# Patient Record
Sex: Male | Born: 1955 | Race: Black or African American | Hispanic: No | Marital: Single | Smoking: Former smoker
Health system: Southern US, Community
[De-identification: ages and names within clinical notes are randomized; demographics above are authoritative.]

## PROBLEM LIST (undated history)

## (undated) ENCOUNTER — Ambulatory Visit: Admission: EM | Payer: Medicaid Other | Source: Home / Self Care

## (undated) DIAGNOSIS — I10 Essential (primary) hypertension: Secondary | ICD-10-CM

## (undated) DIAGNOSIS — M1990 Unspecified osteoarthritis, unspecified site: Secondary | ICD-10-CM

## (undated) DIAGNOSIS — E785 Hyperlipidemia, unspecified: Secondary | ICD-10-CM

## (undated) DIAGNOSIS — I219 Acute myocardial infarction, unspecified: Secondary | ICD-10-CM

## (undated) DIAGNOSIS — I639 Cerebral infarction, unspecified: Secondary | ICD-10-CM

## (undated) DIAGNOSIS — N200 Calculus of kidney: Secondary | ICD-10-CM

## (undated) HISTORY — DX: Calculus of kidney: N20.0

## (undated) HISTORY — DX: Unspecified osteoarthritis, unspecified site: M19.90

## (undated) HISTORY — PX: CHOLECYSTECTOMY: SHX55

---

## 2002-04-29 ENCOUNTER — Emergency Department (HOSPITAL_COMMUNITY): Admission: EM | Admit: 2002-04-29 | Discharge: 2002-04-29 | Payer: Self-pay | Admitting: Emergency Medicine

## 2002-04-29 ENCOUNTER — Encounter: Payer: Self-pay | Admitting: Emergency Medicine

## 2003-03-24 ENCOUNTER — Emergency Department (HOSPITAL_COMMUNITY): Admission: EM | Admit: 2003-03-24 | Discharge: 2003-03-24 | Payer: Self-pay | Admitting: Emergency Medicine

## 2003-04-28 HISTORY — PX: KIDNEY STONE SURGERY: SHX686

## 2004-09-15 ENCOUNTER — Emergency Department (HOSPITAL_COMMUNITY): Admission: EM | Admit: 2004-09-15 | Discharge: 2004-09-16 | Payer: Self-pay | Admitting: *Deleted

## 2005-11-27 ENCOUNTER — Emergency Department (HOSPITAL_COMMUNITY): Admission: EM | Admit: 2005-11-27 | Discharge: 2005-11-27 | Payer: Self-pay | Admitting: Emergency Medicine

## 2007-06-18 ENCOUNTER — Emergency Department (HOSPITAL_COMMUNITY): Admission: EM | Admit: 2007-06-18 | Discharge: 2007-06-18 | Payer: Self-pay | Admitting: Emergency Medicine

## 2007-09-16 ENCOUNTER — Emergency Department (HOSPITAL_COMMUNITY): Admission: EM | Admit: 2007-09-16 | Discharge: 2007-09-16 | Payer: Self-pay | Admitting: Emergency Medicine

## 2007-10-12 ENCOUNTER — Emergency Department (HOSPITAL_COMMUNITY): Admission: EM | Admit: 2007-10-12 | Discharge: 2007-10-12 | Payer: Self-pay | Admitting: Emergency Medicine

## 2007-10-13 ENCOUNTER — Emergency Department (HOSPITAL_COMMUNITY): Admission: EM | Admit: 2007-10-13 | Discharge: 2007-10-13 | Payer: Self-pay | Admitting: Family Medicine

## 2007-10-13 ENCOUNTER — Ambulatory Visit (HOSPITAL_COMMUNITY): Admission: RE | Admit: 2007-10-13 | Discharge: 2007-10-13 | Payer: Self-pay | Admitting: Urology

## 2010-02-20 ENCOUNTER — Emergency Department (HOSPITAL_COMMUNITY): Admission: EM | Admit: 2010-02-20 | Discharge: 2010-02-20 | Payer: Self-pay | Admitting: Emergency Medicine

## 2010-09-09 NOTE — Op Note (Signed)
NAMEZIAN, DELAIR                  ACCOUNT NO.:  [id number]   MEDICAL RECORD NO.:  [id number]          PATIENT TYPE:  AMB   LOCATION:  DAY                          FACILITY:  [facility]   PHYSICIAN:  Ronald L. Earlene Plater, M.D.  DATE OF BIRTH:  16-Feb-1956   DATE OF PROCEDURE:  10/13/2007  DATE OF DISCHARGE:                               OPERATIVE REPORT   DIAGNOSIS:  Urethral stone.   PROCEDURE:  1. Cystourethroscopy.  2. Displacement of urethral stone into bladder.  3. Electrohydraulic lithotripsy and cystolithalopaxy.   SURGEON:  Lucrezia Starch. Earlene Plater, M.D.   ASSISTANT:  Alessandra Bevels. Chase Picket, FNP-C   ANESTHESIA:  LMA.   ESTIMATED BLOOD LOSS:  Negligible.   TUBES:  None.   COMPLICATIONS:  None.   INDICATIONS FOR PROCEDURE:  Mr. Gatliff is a 55 year old black male who  presented with severe pain, urgency, frequency and hematuria.  He was  found on CT scan to have a bladder stone and multiple small renal  calculi, which were peripheral and nonobstructive.  At office  cystourethroscopy, he was found to have a large bladder stone which was  impacted in the membranous urethra, and after understanding the risks,  benefits and alternatives he elected to proceed with the above  procedure.   PROCEDURE IN DETAIL:  The patient was placed in supine position.  After  proper LMA anesthesia, was placed in the dorsal lithotomy position and  prepped and draped with Betadine in a sterile fashion.  Cystourethroscopy was performed with a 22.5 [place] Olympus panendoscope.  The stone was visualized and was manually pushed into the bladder.  Of  interest, the right ureteral orifice appeared to be edematous and  swollen and I suspect it was a passed right ureteral calculus.  The left  ureteral orifice is in normal location and efflux of clear urine was  noted from it.  Utilizing the 9 mm EHL probe, the stone was fragmented  in multiple fragments, which were then irrigated, and the last stone was  grasped with a  nitinol basket to this difficulty, and removed.  Reinspection revealed no significant  trilobar hypertrophy.  The bladder was smoothed-wall and there were no  perforations and no other fragments remained.  The bladder was drained.  The panendoscope was removed.  The specimen was given to the patient and  then submitted for stone analysis, and the patient was taken to the  recovery room stable.      Ronald L. Earlene Plater, M.D.  Electronically Signed     RLD/MEDQ  D:  10/13/2007  T:  10/13/2007  Job:  [id number]

## 2011-01-21 LAB — URINALYSIS, ROUTINE W REFLEX MICROSCOPIC
Glucose, UA: NEGATIVE
Ketones, ur: 15 — AB
Leukocytes, UA: NEGATIVE
Nitrite: POSITIVE — AB
Protein, ur: 100 — AB
Specific Gravity, Urine: >1.03 — ABNORMAL HIGH
Urobilinogen, UA: 1
pH: 6.5

## 2011-01-21 LAB — CBC
HCT: 47.6
Hemoglobin: 16.7
RDW: 14.1

## 2011-01-21 LAB — URINE MICROSCOPIC-ADD ON

## 2011-01-21 LAB — BASIC METABOLIC PANEL
CO2: 26
GFR calc non Af Amer: 55 — ABNORMAL LOW
Glucose, Bld: 112 — ABNORMAL HIGH
Potassium: 4.2
Sodium: 136

## 2011-01-21 LAB — DIFFERENTIAL
Basophils Absolute: 0
Eosinophils Relative: 2
Lymphocytes Relative: 31
Monocytes Absolute: 0.8

## 2011-01-22 LAB — URINALYSIS, ROUTINE W REFLEX MICROSCOPIC
Bilirubin Urine: NEGATIVE
Glucose, UA: NEGATIVE
Ketones, ur: NEGATIVE
Nitrite: NEGATIVE
pH: 7

## 2011-01-22 LAB — HEMOGLOBIN AND HEMATOCRIT, BLOOD: HCT: 51.7

## 2012-01-11 ENCOUNTER — Emergency Department (HOSPITAL_COMMUNITY)
Admission: EM | Admit: 2012-01-11 | Discharge: 2012-01-11 | Disposition: A | Discharge status: Home / Self Care | Payer: Self-pay | Attending: Emergency Medicine | Admitting: Emergency Medicine

## 2012-01-11 ENCOUNTER — Encounter (HOSPITAL_COMMUNITY): Payer: Self-pay | Admitting: *Deleted

## 2012-01-11 DIAGNOSIS — L738 Other specified follicular disorders: Secondary | ICD-10-CM | POA: Insufficient documentation

## 2012-01-11 DIAGNOSIS — Z87891 Personal history of nicotine dependence: Secondary | ICD-10-CM | POA: Insufficient documentation

## 2012-01-11 MED ORDER — CEPHALEXIN 500 MG PO CAPS: 500.0000 mg | ORAL_CAPSULE | Freq: Four times a day (QID) | ORAL | Status: DC

## 2012-01-11 MED ORDER — CEPHALEXIN 500 MG PO CAPS
500.0000 mg | ORAL_CAPSULE | Freq: Once | ORAL | Status: AC
  Administered 2012-01-11: 500 mg via ORAL
  Filled 2012-01-11: qty 1

## 2012-01-11 NOTE — ED Notes (Signed)
Rash to face for 2 days, used someones razor.

## 2012-01-11 NOTE — ED Provider Notes (Signed)
History     CSN: [id number]  Arrival date & time 01/11/12  1634   First MD Initiated Contact with Patient 01/11/12 1741      Chief Complaint  Patient presents with  . Rash    (Consider location/radiation/quality/duration/timing/severity/associated sxs/prior treatment) HPI Comments: Pt used a friend's razor to shave 2-3 days ago and has since broken out in reddish scabbed lesions all over his chin.  Patient is a 56 y.o. male presenting with rash. The history is provided by the patient. No language interpreter was used.  Rash  This is a new problem. The current episode started 2 days ago. The problem has not changed since onset.There has been no fever. Affected Location: chin. Pertinent negatives include no itching. He has tried nothing for the symptoms.    History reviewed. No pertinent past medical history.  Past Surgical History  Procedure Date  . Cholecystectomy   . Kidney stone surgery     History reviewed. No pertinent family history.  History  Substance Use Topics  . Smoking status: Former Games developer  . Smokeless tobacco: Not on file  . Alcohol Use: Yes      Review of Systems  Constitutional: Negative for fever and chills.  Skin: Positive for rash and wound. Negative for itching.  All other systems reviewed and are negative.    Allergies  Penicillins  Home Medications   Current Outpatient Rx  Name Route Sig Dispense Refill  . CEPHALEXIN 500 MG PO CAPS Oral Take 1 capsule (500 mg total) by mouth 4 (four) times daily. 28 capsule 0    BP 120/73  Pulse 98  Temp 98.1 F (36.7 C) (Oral)  Resp 20  Ht 6\' 2"  (1.88 m)  Wt 175 lb (79.379 kg)  BMI 22.47 kg/m2  SpO2 98%  Physical Exam  Nursing note and vitals reviewed. Constitutional: He is oriented to person, place, and time. He appears well-developed and well-nourished.  HENT:  Head: Normocephalic and atraumatic.       10-15 red, inflamed, scabbed lesions scattered over chin.  C/w folliculitis.   Eyes:  EOM are normal.  Neck: Normal range of motion.  Cardiovascular: Normal rate, regular rhythm, normal heart sounds and intact distal pulses.   Pulmonary/Chest: Effort normal and breath sounds normal. No respiratory distress.  Abdominal: Soft. He exhibits no distension. There is no tenderness.  Musculoskeletal: Normal range of motion.  Lymphadenopathy:       Right cervical: No superficial cervical, no deep cervical and no posterior cervical adenopathy present.      Left cervical: No superficial cervical, no deep cervical and no posterior cervical adenopathy present.  Neurological: He is alert and oriented to person, place, and time.  Skin: Skin is warm and dry. Rash noted.  Psychiatric: He has a normal mood and affect. Judgment normal.    ED Course  Procedures (including critical care time)  Labs Reviewed - No data to display No results found.   1. Folliculitis barbae       MDM   rx-keflex 500 mg QID, 28 Warm compresses. Return prn        Evalina Field, [place] 01/11/12 1933

## 2012-01-11 NOTE — ED Provider Notes (Signed)
Medical screening examination/treatment/procedure(s) were performed by non-physician practitioner and as supervising physician I was immediately available for consultation/collaboration  Monicia Tse R. Madisin Hasan, MD 01/11/12 2314 

## 2015-07-15 ENCOUNTER — Emergency Department (INDEPENDENT_AMBULATORY_CARE_PROVIDER_SITE_OTHER): Payer: BLUE CROSS/BLUE SHIELD

## 2015-07-15 ENCOUNTER — Encounter (HOSPITAL_COMMUNITY): Payer: Self-pay | Admitting: Emergency Medicine

## 2015-07-15 ENCOUNTER — Emergency Department (INDEPENDENT_AMBULATORY_CARE_PROVIDER_SITE_OTHER)
Admission: EM | Admit: 2015-07-15 | Discharge: 2015-07-15 | Disposition: A | Discharge status: Home / Self Care | Payer: BLUE CROSS/BLUE SHIELD | Source: Home / Self Care | Attending: Family Medicine | Admitting: Family Medicine

## 2015-07-15 DIAGNOSIS — J111 Influenza due to unidentified influenza virus with other respiratory manifestations: Secondary | ICD-10-CM

## 2015-07-15 MED ORDER — DOXYCYCLINE HYCLATE 100 MG PO CAPS: 100.0000 mg | ORAL_CAPSULE | Freq: Two times a day (BID) | ORAL | Status: DC

## 2015-07-15 NOTE — ED Notes (Signed)
Coughing up brown phlegm, neck and back soreness, denies runny nose.  Reports congestion in the back of throat, frequently having to clear his throat

## 2015-07-15 NOTE — Discharge Instructions (Signed)
Drink plenty of fluids as discussed, use medicine as prescribed, and mucinex or delsym for cough. Return or see your doctor if further problems °

## 2015-07-15 NOTE — ED Provider Notes (Addendum)
CSN: [id number]     Arrival date & time 07/15/15  1652 History   First MD Initiated Contact with Patient 07/15/15 1845     Chief Complaint  Patient presents with  . URI   (Consider location/radiation/quality/duration/timing/severity/associated sxs/prior Treatment) Patient is a 60 y.o. male presenting with URI. The history is provided by the patient.  URI Presenting symptoms: congestion and cough   Presenting symptoms: no fever and no rhinorrhea   Severity:  Moderate Onset quality:  Gradual Duration:  5 days Progression:  Unchanged Chronicity:  New Relieved by:  None tried Worsened by:  Nothing tried Ineffective treatments:  None tried Associated symptoms: no wheezing   Associated symptoms comment:  Smoker    History reviewed. No pertinent past medical history. Past Surgical History  Procedure Laterality Date  . Cholecystectomy    . Kidney stone surgery     No family history on file. Social History  Substance Use Topics  . Smoking status: Former Games developer  . Smokeless tobacco: None  . Alcohol Use: Yes    Review of Systems  Constitutional: Negative.  Negative for fever and chills.  HENT: Positive for congestion and postnasal drip. Negative for rhinorrhea.   Respiratory: Positive for cough. Negative for shortness of breath and wheezing.   Cardiovascular: Negative.   Gastrointestinal: Negative.   Genitourinary: Negative.   All other systems reviewed and are negative.   Allergies  Penicillins  Home Medications   Prior to Admission medications   Medication Sig Start Date End Date Taking? Authorizing Provider  cephALEXin (KEFLEX) 500 MG capsule Take 1 capsule (500 mg total) by mouth 4 (four) times daily. Patient not taking: Reported on 07/15/2015 01/11/12   Worthy Rancher, PA-C  doxycycline (VIBRAMYCIN) 100 MG capsule Take 1 capsule (100 mg total) by mouth 2 (two) times daily. 07/15/15   Linna Hoff, MD   Meds Ordered and Administered this Visit  Medications - No  data to display  BP 160/91 mmHg  Pulse 62  Temp(Src) 98 F (36.7 C) (Oral)  Resp 16  SpO2 100% No data found.   Physical Exam  Constitutional: He is oriented to person, place, and time. He appears well-developed and well-nourished. No distress.  HENT:  Right Ear: External ear normal.  Left Ear: External ear normal.  Mouth/Throat: Oropharynx is clear and moist.  Neck: Normal range of motion. Neck supple.  Cardiovascular: Normal rate, regular rhythm, normal heart sounds and intact distal pulses.   Pulmonary/Chest: Effort normal. He has decreased breath sounds. He has rhonchi.  Lymphadenopathy:    He has no cervical adenopathy.  Neurological: He is alert and oriented to person, place, and time.  Skin: Skin is warm and dry.  Nursing note and vitals reviewed.   ED Course  Procedures (including critical care time)  Labs Review Labs Reviewed - No data to display  Imaging Review Dg Chest 2 View  07/15/2015  CLINICAL DATA:  Neck and back pain radiating to the chest for 1 week. Smoker. EXAM: CHEST  2 VIEW COMPARISON:  02/12/2014 radiographs. FINDINGS: The heart size and mediastinal contours are normal. The lungs are clear. There is no pleural effusion or pneumothorax. No acute osseous findings are identified. Cholecystectomy clips are noted. IMPRESSION: No active cardiopulmonary process. Electronically Signed   By: Carey Bullocks M.D.   On: 07/15/2015 19:21   X-rays reviewed and report per radiologist.   Visual Acuity Review  Right Eye Distance:   Left Eye Distance:   Bilateral Distance:  Right Eye Near:   Left Eye Near:    Bilateral Near:         MDM   1. Bronchitis with influenza    Meds ordered this encounter  Medications  . doxycycline (VIBRAMYCIN) 100 MG capsule    Sig: Take 1 capsule (100 mg total) by mouth 2 (two) times daily.    Dispense:  20 capsule    Refill:  0        Linna HoffJames D Curry Dulski, MD 07/15/15 1934  Linna HoffJames D Auryn Paige, MD 07/15/15 [phone]

## 2015-08-13 ENCOUNTER — Encounter: Payer: Self-pay | Admitting: Internal Medicine

## 2015-09-04 ENCOUNTER — Ambulatory Visit (INDEPENDENT_AMBULATORY_CARE_PROVIDER_SITE_OTHER): Payer: BLUE CROSS/BLUE SHIELD | Admitting: Nurse Practitioner

## 2015-09-04 ENCOUNTER — Other Ambulatory Visit: Payer: Self-pay

## 2015-09-04 ENCOUNTER — Encounter: Payer: Self-pay | Admitting: Nurse Practitioner

## 2015-09-04 VITALS — BP 136/81 | HR 67 | Temp 97.4°F | Ht 74.0 in | Wt 180.8 lb

## 2015-09-04 DIAGNOSIS — R634 Abnormal weight loss: Secondary | ICD-10-CM

## 2015-09-04 DIAGNOSIS — Z1211 Encounter for screening for malignant neoplasm of colon: Secondary | ICD-10-CM | POA: Insufficient documentation

## 2015-09-04 MED ORDER — PEG 3350-KCL-NA BICARB-NACL 420 G PO SOLR: 4000.0000 mL | ORAL | Status: DC

## 2015-09-04 NOTE — Patient Instructions (Signed)
1. We will schedule your procedure for you. 2. Return for follow-up based on postprocedure recommendations. 

## 2015-09-04 NOTE — Progress Notes (Signed)
[facility] Primary Gastroenterologist:  Dr. Jena Gaussourk  Chief Complaint  Patient presents with  . set up TCS possible EGD    HPI:   Terry Mayo is a 60 y.o. male who presents On referral from primary care for fatigue and weight loss. Last saw PCP for 17 2017, notes reviewed. Patient has been seen primary care for the last 2 visits, at his first visit on 06/11/2015 stated "he had not had a physical in a while." His follow-up visit weight loss is continuing. Per referral patient has noted total of 37 pounds weight loss since 03/16/2015. CMP notes creatinine of 1.27. Per PCP past medical history no history of screening colonoscopy for this patient. No other abnormal labs included or discussed. No history of colonoscopy in our system.  Today he states he has not had a colonoscopy before. Has lost about 35 pounds in 2 months. Currently around his recent minimum weight. He states he is very active though. Denies abdominal pain, N/V, hematochezia, melena, fever, chills. Has a bowel movement about every 2-3 days. Has been going daily this week. Occasional straining. Poor appetite. Denies chest pain, dyspnea, dizziness, lightheadedness, syncope, near syncope. Denies any other upper or lower GI symptoms.  No past medical history on file.  Past Surgical History  Procedure Laterality Date  . Cholecystectomy    . Kidney stone surgery      Current Outpatient Prescriptions  Medication Sig Dispense Refill  . cephALEXin (KEFLEX) 500 MG capsule Take 1 capsule (500 mg total) by mouth 4 (four) times daily. (Patient not taking: Reported on 07/15/2015) 28 capsule 0  . doxycycline (VIBRAMYCIN) 100 MG capsule Take 1 capsule (100 mg total) by mouth 2 (two) times daily. (Patient not taking: Reported on 09/04/2015) 20 capsule 0   No current facility-administered medications for this visit.    Allergies as of 09/04/2015 - Review Complete 09/04/2015  Allergen  Reaction Noted  . Penicillins  01/11/2012    No family history on file.  Social History   Social History  . Marital Status: Single    Spouse Name: N/A  . Number of Children: N/A  . Years of Education: N/A   Occupational History  . Not on file.   Social History Main Topics  . Smoking status: Former Games developermoker  . Smokeless tobacco: Not on file  . Alcohol Use: Yes  . Drug Use: No  . Sexual Activity: Not on file   Other Topics Concern  . Not on file   Social History Narrative    Review of Systems: General: Negative for anorexia, fever, chills. ENT: Negative for hoarseness, difficulty swallowing , nasal congestion. CV: Negative for chest pain, angina, palpitations, dyspnea on exertion, peripheral edema.  Respiratory: Negative for dyspnea at rest, dyspnea on exertion, cough, sputum, wheezing.  GI: See history of present illness. MS: Negative for joint pain, low back pain.  Psych: Admits anxiety related to the procedure.  Endo: Negative for unusual weight change.  Heme: Negative for bruising or bleeding. Allergy: Negative for rash or hives.    Physical Exam: BP 136/81 mmHg  Pulse 67  Temp(Src) 97.4 F (36.3 C)  Ht 6\' 2"  (1.88 m)  Wt 180 lb 12.8 oz (82.01 kg)  BMI 23.20 kg/m2 General:   Alert and oriented. Pleasant and cooperative. Well-nourished and well-developed.  Head:  Normocephalic and atraumatic. Eyes:  Without icterus, sclera clear and conjunctiva pink.  Ears:  Normal auditory acuity. Cardiovascular:  S1, S2 present without murmurs appreciated. Extremities without clubbing or edema. Respiratory:  Clear to auscultation bilaterally. No wheezes, rales, or rhonchi. No distress.  Gastrointestinal:  +BS, soft, non-tender and non-distended. No HSM noted. No guarding or rebound. No masses appreciated.  Rectal:  Deferred  Musculoskalatal:  Symmetrical without gross deformities. Neurologic:  Alert and oriented x4;  grossly normal neurologically. Psych:  Alert and  cooperative. Normal mood and affect. Heme/Lymph/Immune: No excessive bruising noted.    09/04/2015 8:48 AM   Disclaimer: This note was dictated with voice recognition software. Similar sounding words can inadvertently be transcribed and may not be corrected upon review.

## 2015-09-04 NOTE — Progress Notes (Signed)
cc'ed to pcp °

## 2015-09-04 NOTE — Assessment & Plan Note (Signed)
Subjective weight loss of approximately 35 pounds in the past couple few months. Otherwise asymptomatic from a GI standpoint. Has never had a colonoscopy before. We will set him up for initial colonoscopy, return for follow-up based on postprocedure recommendations.  Proceed with TCS with propofol/MAC with Dr. Jena Gaussourk in near future: the risks, benefits, and alternatives have been discussed with the patient in detail. The patient states understanding and desires to proceed.  The patient is not on any anticoagulants, anxiolytics, chronic pain medications, antidepressants. He drinks substantial alcohol and smokes marijuana regularly. We will plan for the procedure on propofol/MAC to promote adequate sedation.

## 2015-09-25 NOTE — Patient Instructions (Signed)
Terry Mayo  09/25/2015     @PREFPERIOPPHARMACY @   Your procedure is scheduled on 09/30/2015.  [facility] at 8:00 A.M.  Call this number if you have problems the morning of surgery:  [phone]   Remember:  Do not eat food or drink liquids after midnight.  Take these medicines the morning of surgery with A SIP OF WATER:  none   Do not wear jewelry, make-up or nail polish.  Do not wear lotions, powders, or perfumes.  You may wear deodorant.  Do not shave 48 hours prior to surgery.  Men may shave face and neck.  Do not bring valuables to the hospital.  [facility] not [facility] for any belongings or valuables.  Contacts, dentures or bridgework may not be worn into surgery.  Leave your suitcase in the car.  After surgery it may be brought to your room.  For patients admitted to the hospital, discharge time will be determined by your treatment team.  Patients discharged the day of surgery will not be allowed to drive home.   Name and phone number of your driver:   family Special instructions:  n/a  Please read over the following fact sheets that you were given. Care and Recovery After Surgery    General Anesthesia, Adult General anesthesia is a sleep-like state of non-feeling produced by medicines (anesthetics). General anesthesia prevents you from being alert and feeling pain during a medical procedure. Your caregiver may recommend general anesthesia if your procedure:  Is long.  Is painful or uncomfortable.  Would be frightening to see or hear.  Requires you to be still.  Affects your breathing.  Causes significant blood loss. LET YOUR CAREGIVER KNOW ABOUT:  Allergies to food or medicine.  Medicines taken, including vitamins, herbs, eyedrops, over-the-counter medicines, and creams.  Use of steroids (by mouth or creams).  Previous problems with anesthetics or numbing medicines, including problems experienced by relatives.  History of bleeding  problems or blood clots.  Previous surgeries and types of anesthetics received.  Possibility of pregnancy, if this applies.  Use of cigarettes, alcohol, or illegal drugs.  Any health condition(s), especially diabetes, sleep apnea, and high blood pressure. RISKS AND COMPLICATIONS General anesthesia rarely causes complications. However, if complications do occur, they can be life threatening. Complications include:  A lung infection.  A stroke.  A heart attack.  Waking up during the procedure. When this occurs, the patient may be unable to move and communicate that he or she is awake. The patient may feel severe pain. Older adults and adults with serious medical problems are more likely to have complications than adults who are young and healthy. Some complications can be prevented by answering all of your caregiver's questions thoroughly and by following all pre-procedure instructions. It is important to tell your caregiver if any of the pre-procedure instructions, especially those related to diet, were not followed. Any food or liquid in the stomach can cause problems when you are under general anesthesia. BEFORE THE PROCEDURE  Ask your caregiver if you will have to spend the night at the hospital. If you will not have to spend the night, arrange to have an adult drive you and stay with you for 24 hours.  Follow your caregiver's instructions if you are taking dietary supplements or medicines. Your caregiver may tell you to stop taking them or to reduce your dosage.  Do not smoke for as long as possible before your procedure. If possible, stop smoking  3-6 weeks before the procedure.  Do not take new dietary supplements or medicines within 1 week of your procedure unless your caregiver approves them.  Do not eat within 8 hours of your procedure or as directed by your caregiver. Drink only clear liquids, such as water, black coffee (without milk or cream), and fruit juices (without  pulp).  Do not drink within 3 hours of your procedure or as directed by your caregiver.  You may brush your teeth on the morning of the procedure, but make sure to spit out the toothpaste and water when finished. PROCEDURE  You will receive anesthetics through a mask, through an intravenous (IV) access tube, or through both. A doctor who specializes in anesthesia (anesthesiologist) or a nurse who specializes in anesthesia (nurse anesthetist) or both will stay with you throughout the procedure to make sure you remain unconscious. He or she will also watch your blood pressure, pulse, and oxygen levels to make sure that the anesthetics do not cause any problems. Once you are asleep, a breathing tube or mask may be used to help you breathe. AFTER THE PROCEDURE You will wake up after the procedure is complete. You may be in the room where the procedure was performed or in a recovery area. You may have a sore throat if a breathing tube was used. You may also feel:  Dizzy.  Weak.  Drowsy.  Confused.  Nauseous.  Cold. These are all normal responses and can be expected to last for up to 24 hours after the procedure is complete. A caregiver will tell you when you are ready to go home. This will usually be when you are fully awake and in stable condition.   This information is not intended to replace advice given to you by your health care provider. Make sure you discuss any questions you have with your health care provider.   Document Released: 07/21/2007 Document Revised: 05/04/2014 Document Reviewed: 08/12/2011 Elsevier Interactive Patient Education [employer] Colonoscopy A colonoscopy is an exam to look at the entire large intestine (colon). This exam can help find problems such as tumors, polyps, inflammation, and areas of bleeding. The exam takes about 1 hour.  LET [facility] CARE PROVIDER KNOW ABOUT:   Any allergies you have.  All medicines you are taking, including vitamins,  herbs, eye drops, creams, and over-the-counter medicines.  Previous problems you or members of your family have had with the use of anesthetics.  Any blood disorders you have.  Previous surgeries you have had.  Medical conditions you have. RISKS AND COMPLICATIONS  Generally, this is a safe procedure. However, as with any procedure, complications can occur. Possible complications include:  Bleeding.  Tearing or rupture of the colon wall.  Reaction to medicines given during the exam.  Infection (rare). BEFORE THE PROCEDURE   Ask your health care provider about changing or stopping your regular medicines.  You may be prescribed an oral bowel prep. This involves drinking a large amount of medicated liquid, starting the day before your procedure. The liquid will cause you to have multiple loose stools until your stool is almost clear or light green. This cleans out your colon in preparation for the procedure.  Do not eat or drink anything else once you have started the bowel prep, unless your health care provider tells you it is safe to do so.  Arrange for someone to drive you home after the procedure. PROCEDURE   You will be given medicine to help you  relax (sedative).  You will lie on your side with your knees bent.  A long, flexible tube with a light and camera on the end (colonoscope) will be inserted through the rectum and into the colon. The camera sends video back to a computer screen as it moves through the colon. The colonoscope also releases carbon dioxide gas to inflate the colon. This helps your health care provider see the area better.  During the exam, your health care provider may take a small tissue sample (biopsy) to be examined under a microscope if any abnormalities are found.  The exam is finished when the entire colon has been viewed. AFTER THE PROCEDURE   Do not drive for 24 hours after the exam.  You may have a small amount of blood in your stool.  You  may pass moderate amounts of gas and have mild abdominal cramping or bloating. This is caused by the gas used to inflate your colon during the exam.  Ask when your test results will be ready and how you will get your results. Make sure you get your test results.   This information is not intended to replace advice given to you by your health care provider. Make sure you discuss any questions you have with your health care provider.   Document Released: 04/10/2000 Document Revised: 02/01/2013 Document Reviewed: 12/19/2012 Elsevier Interactive Patient Education [employer]

## 2015-09-26 ENCOUNTER — Other Ambulatory Visit: Payer: Self-pay

## 2015-09-26 ENCOUNTER — Encounter (HOSPITAL_COMMUNITY)
Admission: RE | Admit: 2015-09-26 | Discharge: 2015-09-26 | Disposition: A | Discharge status: Home / Self Care | Payer: BLUE CROSS/BLUE SHIELD | Source: Ambulatory Visit | Attending: Internal Medicine | Admitting: Internal Medicine

## 2015-09-26 ENCOUNTER — Encounter (HOSPITAL_COMMUNITY): Payer: Self-pay

## 2015-09-26 ENCOUNTER — Inpatient Hospital Stay (HOSPITAL_COMMUNITY): Admission: RE | Admit: 2015-09-26 | Payer: BLUE CROSS/BLUE SHIELD | Source: Ambulatory Visit

## 2015-09-26 DIAGNOSIS — Z01812 Encounter for preprocedural laboratory examination: Secondary | ICD-10-CM | POA: Diagnosis not present

## 2015-09-26 DIAGNOSIS — Z6823 Body mass index (BMI) 23.0-23.9, adult: Secondary | ICD-10-CM | POA: Insufficient documentation

## 2015-09-26 DIAGNOSIS — R5383 Other fatigue: Secondary | ICD-10-CM | POA: Insufficient documentation

## 2015-09-26 DIAGNOSIS — R634 Abnormal weight loss: Secondary | ICD-10-CM | POA: Diagnosis not present

## 2015-09-26 DIAGNOSIS — Z01810 Encounter for preprocedural cardiovascular examination: Secondary | ICD-10-CM | POA: Diagnosis not present

## 2015-09-26 LAB — CBC WITH DIFFERENTIAL/PLATELET
Basophils Absolute: 0 10*3/uL (ref 0.0–0.1)
Basophils Relative: 0 %
EOS ABS: 0.4 10*3/uL (ref 0.0–0.7)
EOS PCT: 6 %
HCT: 41.9 % (ref 39.0–52.0)
Hemoglobin: 14.6 g/dL (ref 13.0–17.0)
LYMPHS ABS: 3.2 10*3/uL (ref 0.7–4.0)
Lymphocytes Relative: 48 %
MCH: 32.2 pg (ref 26.0–34.0)
MCHC: 34.8 g/dL (ref 30.0–36.0)
MCV: 92.3 fL (ref 78.0–100.0)
MONO ABS: 0.3 10*3/uL (ref 0.1–1.0)
Monocytes Relative: 5 %
NEUTROS ABS: 2.8 10*3/uL (ref 1.7–7.7)
NEUTROS PCT: 41 %
PLATELETS: 198 10*3/uL (ref 150–400)
RBC: 4.54 MIL/uL (ref 4.22–5.81)
RDW: 14 % (ref 11.5–15.5)
WBC: 6.7 10*3/uL (ref 4.0–10.5)

## 2015-09-26 LAB — BASIC METABOLIC PANEL
Anion gap: 6 (ref 5–15)
BUN: 17 mg/dL (ref 6–20)
CALCIUM: 8.8 mg/dL — AB (ref 8.9–10.3)
CO2: 26 mmol/L (ref 22–32)
CREATININE: 1.09 mg/dL (ref 0.61–1.24)
Chloride: 104 mmol/L (ref 101–111)
GFR calc Af Amer: >60 mL/min (ref >60)
Glucose, Bld: 82 mg/dL (ref 65–99)
POTASSIUM: 3.7 mmol/L (ref 3.5–5.1)
SODIUM: 136 mmol/L (ref 135–145)

## 2015-09-30 ENCOUNTER — Ambulatory Visit (HOSPITAL_COMMUNITY)
Admission: RE | Admit: 2015-09-30 | Discharge: 2015-09-30 | Disposition: A | Discharge status: Home / Self Care | Payer: BLUE CROSS/BLUE SHIELD | Source: Ambulatory Visit | Attending: Internal Medicine | Admitting: Internal Medicine

## 2015-09-30 ENCOUNTER — Ambulatory Visit (HOSPITAL_COMMUNITY): Payer: BLUE CROSS/BLUE SHIELD | Admitting: Anesthesiology

## 2015-09-30 ENCOUNTER — Encounter (HOSPITAL_COMMUNITY)
Admission: RE | Disposition: A | Discharge status: Home / Self Care | Payer: Self-pay | Source: Ambulatory Visit | Attending: Internal Medicine

## 2015-09-30 ENCOUNTER — Encounter (HOSPITAL_COMMUNITY): Payer: Self-pay | Admitting: *Deleted

## 2015-09-30 DIAGNOSIS — Z79899 Other long term (current) drug therapy: Secondary | ICD-10-CM | POA: Insufficient documentation

## 2015-09-30 DIAGNOSIS — Z880 Allergy status to penicillin: Secondary | ICD-10-CM | POA: Diagnosis not present

## 2015-09-30 DIAGNOSIS — Z87891 Personal history of nicotine dependence: Secondary | ICD-10-CM | POA: Insufficient documentation

## 2015-09-30 DIAGNOSIS — Z7902 Long term (current) use of antithrombotics/antiplatelets: Secondary | ICD-10-CM | POA: Insufficient documentation

## 2015-09-30 DIAGNOSIS — Z1211 Encounter for screening for malignant neoplasm of colon: Secondary | ICD-10-CM | POA: Diagnosis present

## 2015-09-30 HISTORY — PX: COLONOSCOPY WITH PROPOFOL: SHX5780

## 2015-09-30 SURGERY — COLONOSCOPY WITH PROPOFOL
Anesthesia: Monitor Anesthesia Care

## 2015-09-30 MED ORDER — PROPOFOL 10 MG/ML IV BOLUS
INTRAVENOUS | Status: AC
  Filled 2015-09-30: qty 20

## 2015-09-30 MED ORDER — ONDANSETRON HCL 4 MG/2ML IJ SOLN: 4.0000 mg | Freq: Once | INTRAMUSCULAR | Status: DC | PRN

## 2015-09-30 MED ORDER — FENTANYL CITRATE (PF) 100 MCG/2ML IJ SOLN: 25.0000 ug | INTRAMUSCULAR | Status: DC | PRN

## 2015-09-30 MED ORDER — LACTATED RINGERS IV SOLN
INTRAVENOUS | Status: DC
  Administered 2015-09-30: 09:00:00 via INTRAVENOUS

## 2015-09-30 MED ORDER — FENTANYL CITRATE (PF) 100 MCG/2ML IJ SOLN
25.0000 ug | INTRAMUSCULAR | Status: AC
  Administered 2015-09-30 (×2): 25 ug via INTRAVENOUS

## 2015-09-30 MED ORDER — MIDAZOLAM HCL 2 MG/2ML IJ SOLN
INTRAMUSCULAR | Status: AC
  Filled 2015-09-30: qty 2

## 2015-09-30 MED ORDER — MIDAZOLAM HCL 5 MG/5ML IJ SOLN
INTRAMUSCULAR | Status: DC | PRN
  Administered 2015-09-30: 2 mg via INTRAVENOUS

## 2015-09-30 MED ORDER — FENTANYL CITRATE (PF) 100 MCG/2ML IJ SOLN
INTRAMUSCULAR | Status: AC
  Filled 2015-09-30: qty 2

## 2015-09-30 MED ORDER — PROPOFOL 500 MG/50ML IV EMUL
INTRAVENOUS | Status: DC | PRN
  Administered 2015-09-30: 125 ug/kg/min via INTRAVENOUS

## 2015-09-30 MED ORDER — MIDAZOLAM HCL 2 MG/2ML IJ SOLN
1.0000 mg | INTRAMUSCULAR | Status: DC | PRN
  Administered 2015-09-30: 2 mg via INTRAVENOUS

## 2015-09-30 NOTE — Anesthesia Postprocedure Evaluation (Signed)
Anesthesia Post Note  Patient: Terry Mayo  Procedure(s) Performed: Procedure(s) (LRB): COLONOSCOPY WITH PROPOFOL (N/A)  Patient location during evaluation: PACU Anesthesia Type: MAC Level of consciousness: awake and patient cooperative Pain management: pain level controlled Vital Signs Assessment: post-procedure vital signs reviewed and stable Respiratory status: spontaneous breathing, nonlabored ventilation and respiratory function stable Cardiovascular status: blood pressure returned to baseline and stable Postop Assessment: no signs of nausea or vomiting Anesthetic complications: no    Last Vitals:  Filed Vitals:   09/30/15 0840 09/30/15 0845  BP: 127/87 119/82  Temp:    Resp: 12 19    Last Pain: There were no vitals filed for this visit.               Areona Homer J

## 2015-09-30 NOTE — Interval H&P Note (Signed)
History and Physical Interval Note:  09/30/2015 8:32 AM  Terry Mayo  has presented today for surgery, with the diagnosis of screening  The various methods of treatment have been discussed with the patient and family. After consideration of risks, benefits and other options for treatment, the patient has consented to  Procedure(s) with comments: COLONOSCOPY WITH PROPOFOL (N/A) - 930 as a surgical intervention .  The patient's history has been reviewed, patient examined, no change in status, stable for surgery.  I have reviewed the patient's chart and labs.  Questions were answered to the patient's satisfaction.     No change. Patient presents today for his first ever screening colonoscopy. The risks, benefits, limitations, alternatives and imponderables have been reviewed with the patient. Questions have been answered. All parties are agreeable.   [name]

## 2015-09-30 NOTE — Discharge Instructions (Signed)
°  Colonoscopy Discharge Instructions  Read the instructions outlined below and refer to this sheet in the next few weeks. These discharge instructions provide you with general information on caring for yourself after you leave the hospital. Your doctor may also give you specific instructions. While your treatment has been planned according to the most current medical practices available, unavoidable complications occasionally occur. If you have any problems or questions after discharge, call Dr. Jena Gaussourk at [phone]. ACTIVITY  You may resume your regular activity, but move at a slower pace for the next 24 hours.   Take frequent rest periods for the next 24 hours.   Walking will help get rid of the air and reduce the bloated feeling in your belly (abdomen).   No driving for 24 hours (because of the medicine (anesthesia) used during the test).    Do not sign any important legal documents or operate any machinery for 24 hours (because of the anesthesia used during the test).  NUTRITION  Drink plenty of fluids.   You may resume your normal diet as instructed by your doctor.   Begin with a light meal and progress to your normal diet. Heavy or fried foods are harder to digest and may make you feel sick to your stomach (nauseated).   Avoid alcoholic beverages for 24 hours or as instructed.  MEDICATIONS  You may resume your normal medications unless your doctor tells you otherwise.  WHAT YOU CAN EXPECT TODAY  Some feelings of bloating in the abdomen.   Passage of more gas than usual.   Spotting of blood in your stool or on the toilet paper.  IF YOU HAD POLYPS REMOVED DURING THE COLONOSCOPY:  No aspirin products for 7 days or as instructed.   No alcohol for 7 days or as instructed.   Eat a soft diet for the next 24 hours.  FINDING OUT THE RESULTS OF YOUR TEST Not all test results are available during your visit. If your test results are not back during the visit, make an appointment  with your caregiver to find out the results. Do not assume everything is normal if you have not heard from your caregiver or the medical facility. It is important for you to follow up on all of your test results.  SEEK IMMEDIATE MEDICAL ATTENTION IF:  You have more than a spotting of blood in your stool.   Your belly is swollen (abdominal distention).   You are nauseated or vomiting.   You have a temperature over 101.   You have abdominal pain or discomfort that is severe or gets worse throughout the day.    Your colonoscopy was normal today  I recommend you have one more screening colonoscopy in 10 years

## 2015-09-30 NOTE — Transfer of Care (Signed)
Immediate Anesthesia Transfer of Care Note  Patient: Terry Mayo  Procedure(s) Performed: Procedure(s) with comments: COLONOSCOPY WITH PROPOFOL (N/A) - 930  Patient Location: PACU  Anesthesia Type:MAC  Level of Consciousness: awake and patient cooperative  Airway & Oxygen Therapy: Patient Spontanous Breathing and Patient connected to face mask oxygen  Post-op Assessment: Report given to RN, Post -op Vital signs reviewed and stable and Patient moving all extremities  Post vital signs: Reviewed and stable  Last Vitals:  Filed Vitals:   09/30/15 0840 09/30/15 0845  BP: 127/87 119/82  Temp:    Resp: 12 19    Last Pain: There were no vitals filed for this visit.    Patients Stated Pain Goal: 2 (09/30/15 0940)  Complications: No apparent anesthesia complications

## 2015-09-30 NOTE — H&P (View-Only) (Signed)
Primary Care Physician:  [facility] Primary Gastroenterologist:  Dr. Jena Gauss  Chief Complaint  Patient presents with  . set up TCS possible EGD    HPI:   Terry Mayo is a 60 y.o. male who presents On referral from primary care for fatigue and weight loss. Last saw PCP for 17 2017, notes reviewed. Patient has been seen primary care for the last 2 visits, at his first visit on 06/11/2015 stated "he had not had a physical in a while." His follow-up visit weight loss is continuing. Per referral patient has noted total of 37 pounds weight loss since 03/16/2015. CMP notes creatinine of 1.27. Per PCP past medical history no history of screening colonoscopy for this patient. No other abnormal labs included or discussed. No history of colonoscopy in our system.  Today he states he has not had a colonoscopy before. Has lost about 35 pounds in 2 months. Currently around his recent minimum weight. He states he is very active though. Denies abdominal pain, N/V, hematochezia, melena, fever, chills. Has a bowel movement about every 2-3 days. Has been going daily this week. Occasional straining. Poor appetite. Denies chest pain, dyspnea, dizziness, lightheadedness, syncope, near syncope. Denies any other upper or lower GI symptoms.  No past medical history on file.  Past Surgical History  Procedure Laterality Date  . Cholecystectomy    . Kidney stone surgery      Current Outpatient Prescriptions  Medication Sig Dispense Refill  . cephALEXin (KEFLEX) 500 MG capsule Take 1 capsule (500 mg total) by mouth 4 (four) times daily. (Patient not taking: Reported on 07/15/2015) 28 capsule 0  . doxycycline (VIBRAMYCIN) 100 MG capsule Take 1 capsule (100 mg total) by mouth 2 (two) times daily. (Patient not taking: Reported on 09/04/2015) 20 capsule 0   No current facility-administered medications for this visit.    Allergies as of 09/04/2015 - Review Complete 09/04/2015  Allergen  Reaction Noted  . Penicillins  01/11/2012    No family history on file.  Social History   Social History  . Marital Status: Single    Spouse Name: N/A  . Number of Children: N/A  . Years of Education: N/A   Occupational History  . Not on file.   Social History Main Topics  . Smoking status: Former Games developer  . Smokeless tobacco: Not on file  . Alcohol Use: Yes  . Drug Use: No  . Sexual Activity: Not on file   Other Topics Concern  . Not on file   Social History Narrative    Review of Systems: General: Negative for anorexia, fever, chills. ENT: Negative for hoarseness, difficulty swallowing , nasal congestion. CV: Negative for chest pain, angina, palpitations, dyspnea on exertion, peripheral edema.  Respiratory: Negative for dyspnea at rest, dyspnea on exertion, cough, sputum, wheezing.  GI: See history of present illness. MS: Negative for joint pain, low back pain.  Psych: Admits anxiety related to the procedure.  Endo: Negative for unusual weight change.  Heme: Negative for bruising or bleeding. Allergy: Negative for rash or hives.    Physical Exam: BP 136/81 mmHg  Pulse 67  Temp(Src) 97.4 F (36.3 C)  Ht  (1.88 m)  Wt 180 lb 12.8 oz (82.01 kg)  BMI 23.20 kg/m2 General:   Alert and oriented. Pleasant and cooperative. Well-nourished and well-developed.  Head:  Normocephalic and atraumatic. Eyes:  Without icterus, sclera clear and conjunctiva pink.  Ears:  Normal auditory acuity. Cardiovascular:  S1, S2 present without murmurs appreciated. Extremities without clubbing or edema. Respiratory:  Clear to auscultation bilaterally. No wheezes, rales, or rhonchi. No distress.  Gastrointestinal:  +BS, soft, non-tender and non-distended. No HSM noted. No guarding or rebound. No masses appreciated.  Rectal:  Deferred  Musculoskalatal:  Symmetrical without gross deformities. Neurologic:  Alert and oriented x4;  grossly normal neurologically. Psych:  Alert and  cooperative. Normal mood and affect. Heme/Lymph/Immune: No excessive bruising noted.    09/04/2015 8:48 AM   Disclaimer: This note was dictated with voice recognition software. Similar sounding words can inadvertently be transcribed and may not be corrected upon review.

## 2015-09-30 NOTE — Interval H&P Note (Signed)
History and Physical Interval Note:  09/30/2015 9:22 AM  Terry Mayo  has presented today for surgery, with the diagnosis of screening  The various methods of treatment have been discussed with the patient and family. After consideration of risks, benefits and other options for treatment, the patient has consented to  Procedure(s) with comments: COLONOSCOPY WITH PROPOFOL (N/A) - 930 as a surgical intervention .  The patient's history has been reviewed, patient examined, no change in status, stable for surgery.  I have reviewed the patient's chart and labs.  Questions were answered to the patient's satisfaction.     [name]  No change. Patient here for screening colonoscopy. The risks, benefits, limitations, alternatives and imponderables have been reviewed with the patient. Questions have been answered. All parties are agreeable.

## 2015-09-30 NOTE — Anesthesia Preprocedure Evaluation (Signed)
Anesthesia Evaluation  Patient identified by MRN, date of birth, ID band Patient awake    Reviewed: Allergy & Precautions, NPO status , Patient's Chart, lab work & pertinent test results  Airway Mallampati: II  TM Distance: >3 FB     Dental  (+) Missing   Pulmonary former smoker,    breath sounds clear to auscultation       Cardiovascular negative cardio ROS   Rhythm:Regular Rate:Normal     Neuro/Psych    GI/Hepatic negative GI ROS,   Endo/Other    Renal/GU Renal disease (kidney stones)     Musculoskeletal   Abdominal   Peds  Hematology   Anesthesia Other Findings   Reproductive/Obstetrics                             Anesthesia Physical Anesthesia Plan  ASA: II  Anesthesia Plan: MAC   Post-op Pain Management:    Induction: Intravenous  Airway Management Planned: Simple Face Mask  Additional Equipment:   Intra-op Plan:   Post-operative Plan:   Informed Consent: I have reviewed the patients History and Physical, chart, labs and discussed the procedure including the risks, benefits and alternatives for the proposed anesthesia with the patient or authorized representative who has indicated his/her understanding and acceptance.     Plan Discussed with:   Anesthesia Plan Comments:         Anesthesia Quick Evaluation

## 2015-09-30 NOTE — Op Note (Signed)
[facility] Patient Name: Terry Mayo Procedure Date: 09/30/2015 9:40 AM MRN: [id number] Date of Birth: 06/15/1955 Attending MD: Gennette Pac , MD CSN: [id number] Age: 60 Admit Type: Outpatient Procedure:                Ileo-colonoscopy Indications:              Screening for colorectal malignant neoplasm Providers:                Gennette Pac, MD, Nena Polio, RN, Burke Keels, Technician Referring MD:              Medicines:                Monitored Anesthesia Care Complications:            No immediate complications. Estimated Blood Loss:     Estimated blood loss was minimal. Procedure:                Pre-Anesthesia Assessment:                           - Prior to the procedure, a History and Physical                            was performed, and patient medications and                            allergies were reviewed. The patient's tolerance of                            previous anesthesia was also reviewed. The risks                            and benefits of the procedure and the sedation                            options and risks were discussed with the patient.                            All questions were answered, and informed consent                            was obtained. ASA Grade Assessment: II - A patient                            with mild systemic disease. After reviewing the                            risks and benefits, the patient was deemed in                            satisfactory condition to undergo the procedure.                           -  Prior to the procedure, a History and Physical                            was performed, and patient medications and                            allergies were reviewed. The patient's tolerance of                            previous anesthesia was also reviewed. The risks                            and benefits of the procedure and the sedation    options and risks were discussed with the patient.                            All questions were answered, and informed consent                            was obtained. Prior Anticoagulants: The patient has                            taken no previous anticoagulant or antiplatelet                            agents. ASA Grade Assessment: II - A patient with                            mild systemic disease. After reviewing the risks                            and benefits, the patient was deemed in                            satisfactory condition to undergo the procedure.                           After obtaining informed consent, the colonoscope                            was passed under direct vision. Throughout the                            procedure, the patient's blood pressure, pulse, and                            oxygen saturations were monitored continuously. The                            EC-[id number] ([id number]) scope was introduced through                            the anus and advanced to the 5 cm into the ileum.  The terminal ileum, ileocecal valve, appendiceal                            orifice, and rectum were photographed. The terminal                            ileum, ileocecal valve, appendiceal orifice, and                            rectum were photographed. The entire colon was                            examined. The entire colon was well visualized. The                            colonoscopy was performed without difficulty. The                            patient tolerated the procedure well. The quality                            of the bowel preparation was adequate. Scope In: 9:48:20 AM Scope Out: 10:01:13 AM Scope Withdrawal Time: 0 hours 8 minutes 48 seconds  Total Procedure Duration: 0 hours 12 minutes 53 seconds  Findings:      The perianal and digital rectal examinations were normal. The distal 5       cm of terminal ileal mucosa  also appeared normal.      The colon appeared normal. Retroflexion of the distal rectum also       revealed no abnormalities.Estimated blood loss: none. Impression:               - The entire examined colon is normal.                           - No specimens collected. Moderate Sedation:      Moderate (conscious) sedation was administered by the endoscopy nurse       and supervised by the endoscopist. The following parameters were       monitored: oxygen saturation, heart rate, blood pressure, respiratory       rate, EKG, adequacy of pulmonary ventilation, and response to care.       Total physician intraservice time was 19 minutes. Recommendation:           - Patient has a contact number available for                            emergencies. The signs and symptoms of potential                            delayed complications were discussed with the                            patient. Return to normal activities tomorrow.  Written discharge instructions were provided to the                            patient.                           - Advance diet as tolerated today.                           - Continue present medications.                           - Repeat colonoscopy in 10 years for screening                            purposes.                           - Return to GI clinic PRN. Procedure Code(s):        --- Professional ---                           [phone], Colonoscopy, flexible; diagnostic, including                            collection of specimen(s) by brushing or washing,                            when performed (separate procedure)                           99152, Moderate sedation services provided by the                            same physician or other qualified health care                            professional performing the diagnostic or                            therapeutic service that the sedation supports,                             requiring the presence of an independent trained                            observer to assist in the monitoring of the                            patient's level of consciousness and physiological                            status; initial 15 minutes of intraservice time,                            patient age 71 years or  older Diagnosis Code(s):        --- Professional ---                           Z12.11, Encounter for screening for malignant                            neoplasm of colon CPT copyright 2016 American Medical Association. All rights reserved. The codes documented in this report are preliminary and upon coder review may  be revised to meet current compliance requirements. Gerrit Friends. Yohannes Waibel, MD Gennette Pac, MD 09/30/2015 10:17:06 AM This report has been signed electronically. Number of Addenda: 0

## 2015-10-02 ENCOUNTER — Encounter (HOSPITAL_COMMUNITY): Payer: Self-pay | Admitting: Internal Medicine

## 2017-07-18 ENCOUNTER — Encounter (HOSPITAL_COMMUNITY): Payer: Self-pay | Admitting: Emergency Medicine

## 2017-07-18 ENCOUNTER — Ambulatory Visit (HOSPITAL_COMMUNITY)
Admission: EM | Admit: 2017-07-18 | Discharge: 2017-07-18 | Disposition: A | Discharge status: Home / Self Care | Payer: BLUE CROSS/BLUE SHIELD | Attending: Physician Assistant | Admitting: Physician Assistant

## 2017-07-18 DIAGNOSIS — R059 Cough, unspecified: Secondary | ICD-10-CM

## 2017-07-18 DIAGNOSIS — R05 Cough: Secondary | ICD-10-CM | POA: Diagnosis not present

## 2017-07-18 MED ORDER — HYDROCODONE-HOMATROPINE 5-1.5 MG/5ML PO SYRP: 2.5000 mL | ORAL_SOLUTION | Freq: Every day | ORAL | 0 refills | Status: AC

## 2017-07-18 MED ORDER — CETIRIZINE HCL 10 MG PO TABS: 10.0000 mg | ORAL_TABLET | Freq: Every day | ORAL | 0 refills | Status: DC

## 2017-07-18 NOTE — ED Provider Notes (Signed)
07/18/2017 2:29 PM   DOB: [birth year] / MRN: [id number]  SUBJECTIVE:  Terry Mayo is a 62 y.o. male presenting for cough that started 4 days ago.  Feels it is getting worse.  Denies fever, chills, change in appetite.  Denies diaphoresis and dizziness.  States "I have allergies about this time every year and it bothers me."  He is allergic to penicillins.   He  has a past medical history of Arthritis and Kidney stones.    He  reports that he quit smoking about 1 years ago. He has never used smokeless tobacco. He reports that he drinks alcohol. He reports that he does not use drugs. He  has no sexual activity history on file. The patient  has a past surgical history that includes Cholecystectomy (1990s); Kidney stone surgery (2005); and Colonoscopy with propofol (N/A, 09/30/2015).  His family history is not on file.  Review of Systems  Constitutional: Negative for chills, diaphoresis and fever.  Respiratory: Positive for cough and sputum production. Negative for hemoptysis, shortness of breath and wheezing.   Cardiovascular: Negative for chest pain, orthopnea and leg swelling.  Gastrointestinal: Negative for nausea.  Skin: Negative for rash.  Neurological: Negative for dizziness.    OBJECTIVE:  BP (!) 148/80   Pulse 66   Temp 98 F (36.7 C)   Resp 18   SpO2 100%   Physical Exam  Constitutional: He appears well-developed. He is active and cooperative.  Non-toxic appearance.  HENT:  Right Ear: Hearing, tympanic membrane, external ear and ear canal normal.  Left Ear: Hearing, tympanic membrane, external ear and ear canal normal.  Nose: Nose normal. Right sinus exhibits no maxillary sinus tenderness and no frontal sinus tenderness. Left sinus exhibits no maxillary sinus tenderness and no frontal sinus tenderness.  Mouth/Throat: Uvula is midline, oropharynx is clear and moist and mucous membranes are normal. No oropharyngeal exudate, posterior oropharyngeal edema or tonsillar abscesses.   Eyes: Pupils are equal, round, and reactive to light. Conjunctivae are normal.  Cardiovascular: Normal rate, regular rhythm, S1 normal, S2 normal, normal heart sounds, intact distal pulses and normal pulses. Exam reveals no gallop and no friction rub.  No murmur heard. Pulmonary/Chest: Effort normal. No stridor. No tachypnea. No respiratory distress. He has no wheezes. He has no rales.  Abdominal: He exhibits no distension.  Musculoskeletal: He exhibits no edema.  Lymphadenopathy:       Head (right side): No submandibular and no tonsillar adenopathy present.       Head (left side): No submandibular and no tonsillar adenopathy present.    He has no cervical adenopathy.  Neurological: He is alert.  Skin: Skin is warm and dry. He is not diaphoretic. No pallor.  Vitals reviewed.   No results found for this or any previous visit (from the past 72 hour(s)).  No results found.  ASSESSMENT AND PLAN:  No orders of the defined types were placed in this encounter.    Cough in adult patient - Patient with very reassuring exam today.  Vitals within normal limits.  Highly a bacterial process.  Allergic versus viral seems much more likely.  I will treat to that end.  Advised to come back if he is not getting better.      The patient is advised to call or return to clinic if he does not see an improvement in symptoms, or to seek the care of the closest emergency department if he worsens with the above plan.   Deliah Boston, MHS,  PA-C 07/18/2017 2:29 PM    Ofilia Neaslark, Michael L, PA-C 07/18/17 1429

## 2017-07-18 NOTE — Discharge Instructions (Signed)
I think your cough is secondary to either a virus or allergies for both.  Please take Zyrtec as prescribed this is will "cure" your allergies.  The cough syrup should help you get some rest.  Please make sure you drink lots of fluids as this will thin the mucus in your chest and help your cough become more productive.

## 2017-07-18 NOTE — ED Triage Notes (Signed)
Pt c/o cough for several days, sometimes its dry, sometimes hes coughing up brown or yellow stuff.

## 2017-11-15 IMAGING — DX DG CHEST 2V
2 series · 2 of 2 positions shown · non-contrast
Comparison: 02/12/2014 radiographs.

CLINICAL DATA: Neck and back pain radiating to the chest for 1
week. Smoker.

EXAM:
CHEST  2 VIEW

[chest pa]
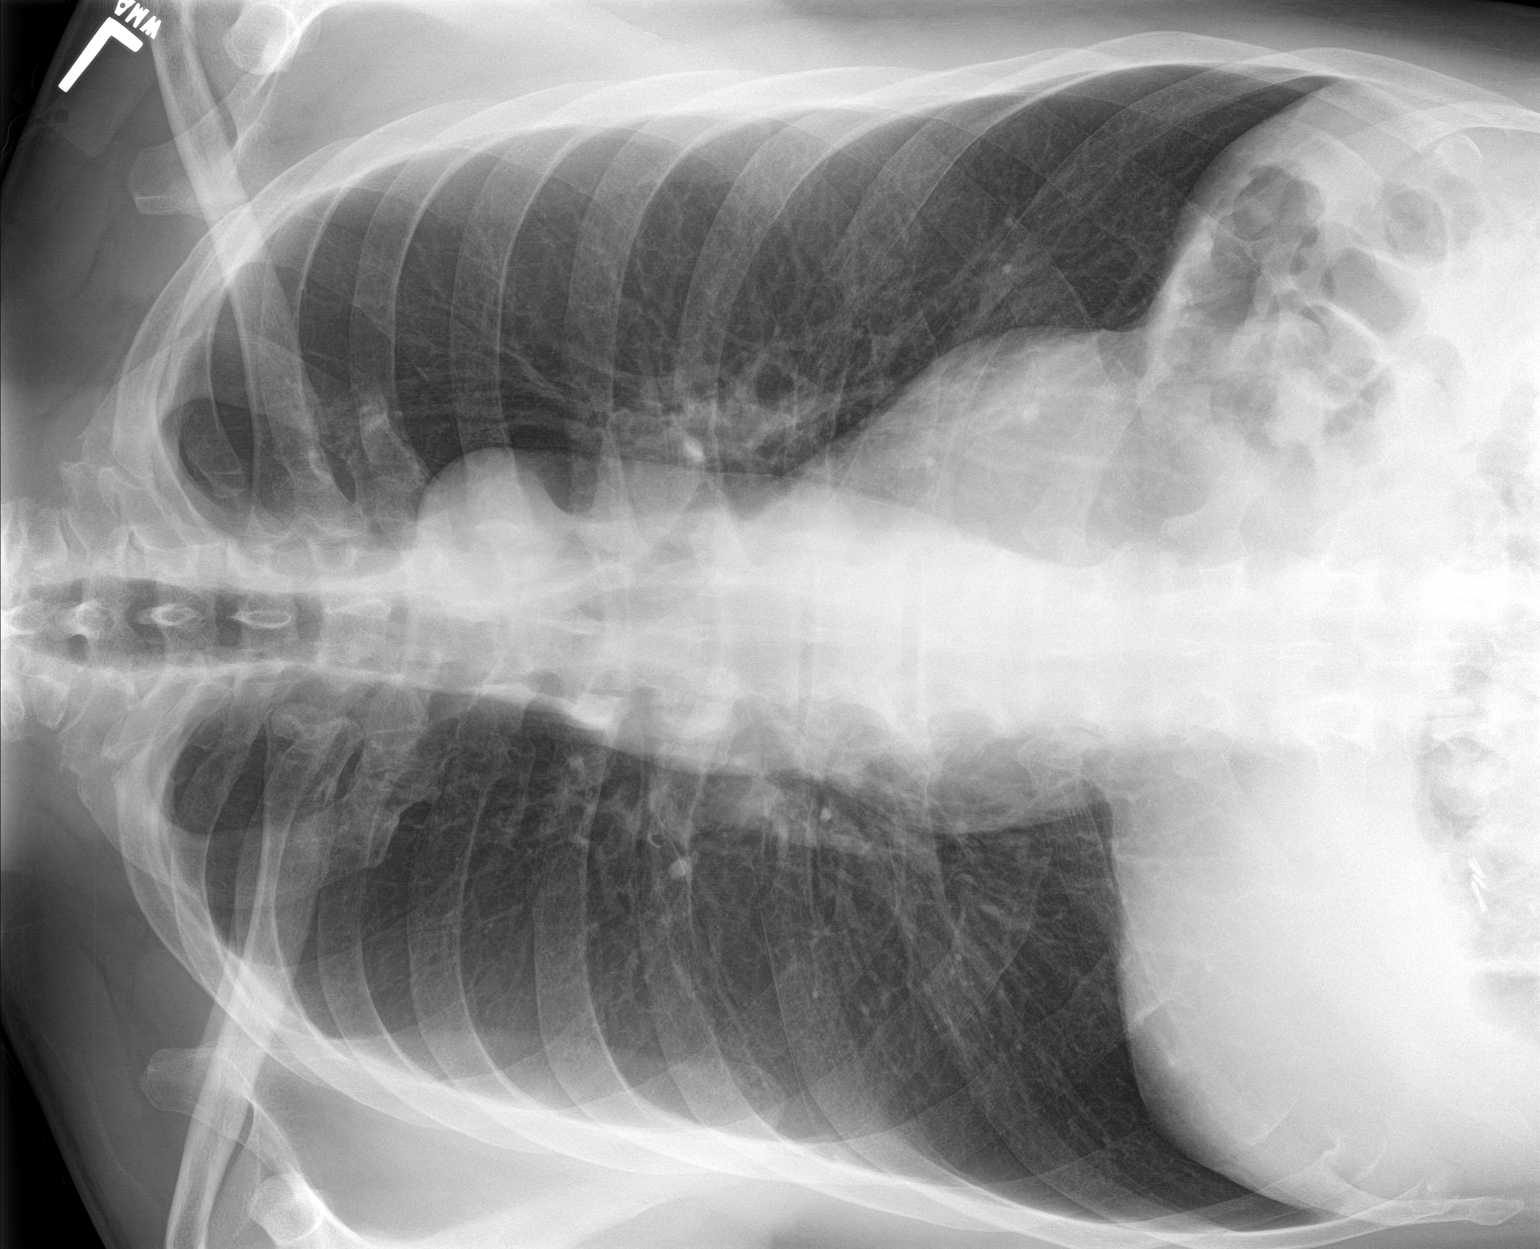

[chest lat]
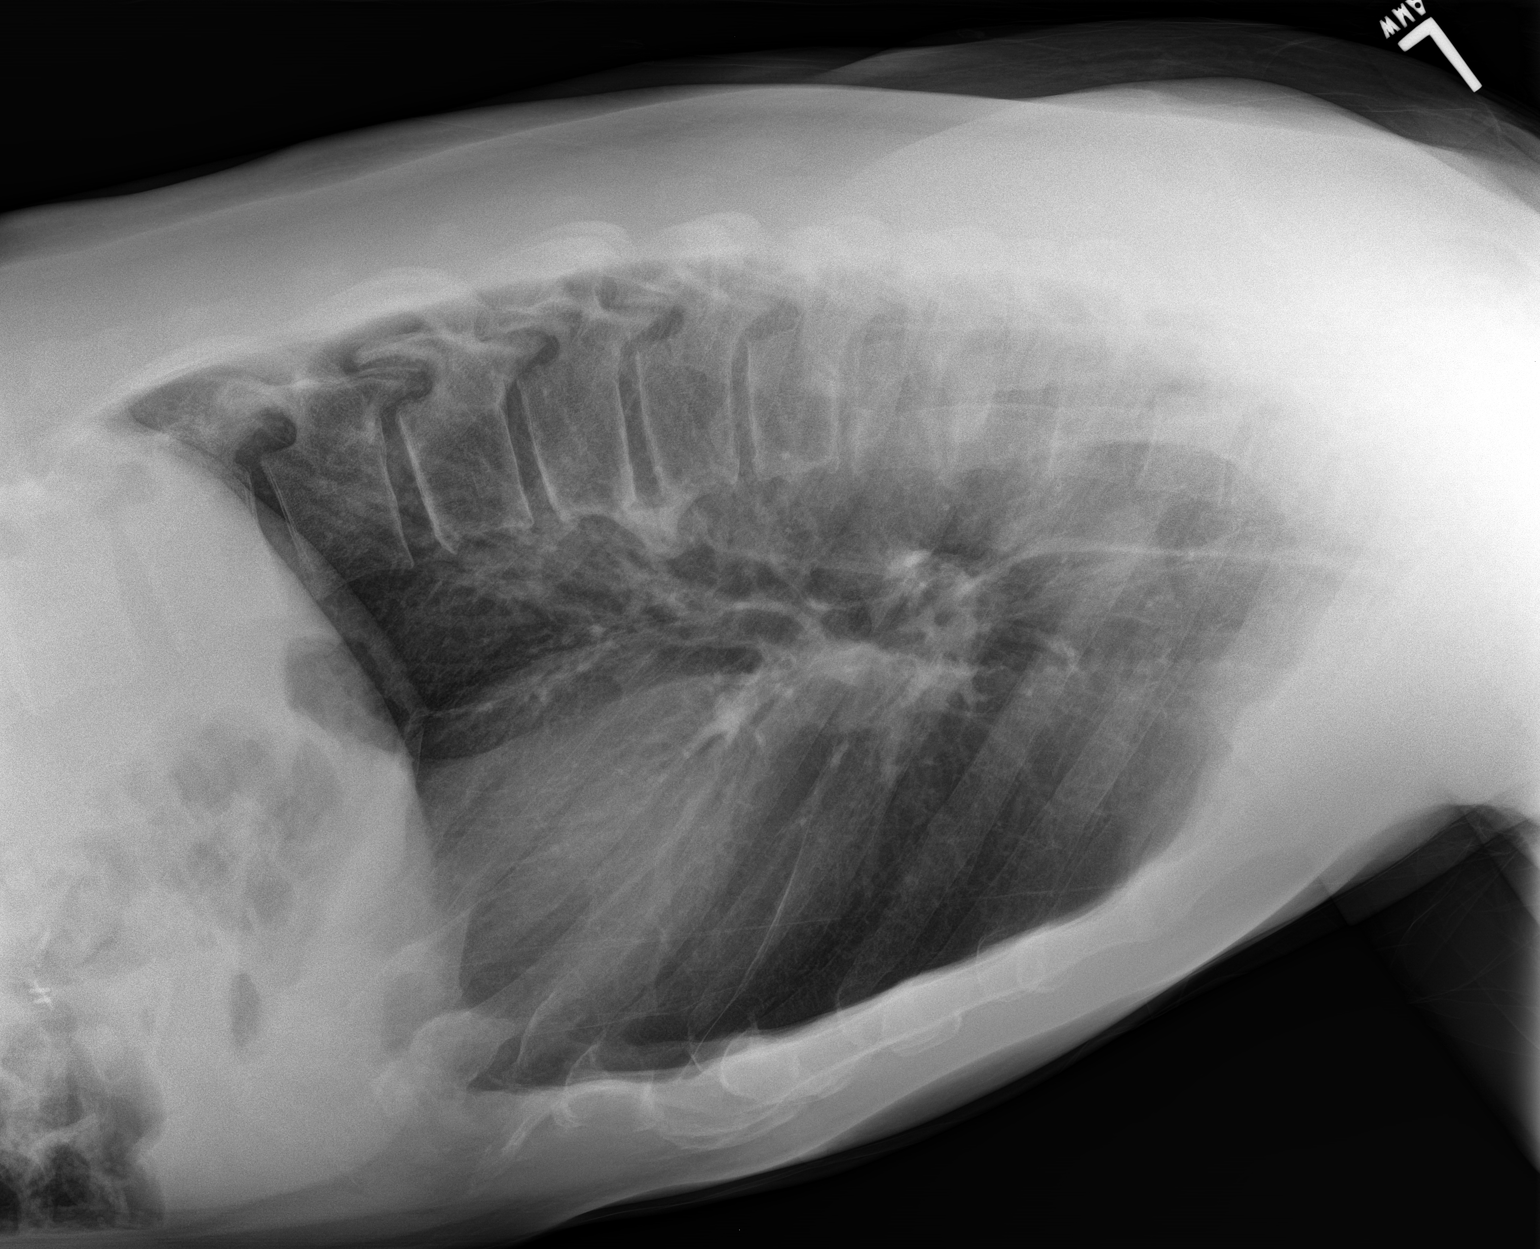

[2 of 2 positions shown; findings below may reference images not displayed]

FINDINGS: The heart size and mediastinal contours are normal. The lungs are
clear. There is no pleural effusion or pneumothorax. No acute
osseous findings are identified. Cholecystectomy clips are noted.
IMPRESSION: No active cardiopulmonary process.

## 2018-04-18 ENCOUNTER — Encounter: Payer: Self-pay | Admitting: Internal Medicine

## 2018-04-30 ENCOUNTER — Encounter (HOSPITAL_COMMUNITY): Payer: Self-pay | Admitting: Emergency Medicine

## 2018-04-30 ENCOUNTER — Emergency Department (HOSPITAL_COMMUNITY)
Admission: EM | Admit: 2018-04-30 | Discharge: 2018-04-30 | Disposition: A | Discharge status: Home / Self Care | Payer: BLUE CROSS/BLUE SHIELD | Attending: Emergency Medicine | Admitting: Emergency Medicine

## 2018-04-30 DIAGNOSIS — Z87891 Personal history of nicotine dependence: Secondary | ICD-10-CM | POA: Insufficient documentation

## 2018-04-30 DIAGNOSIS — Z79899 Other long term (current) drug therapy: Secondary | ICD-10-CM | POA: Insufficient documentation

## 2018-04-30 DIAGNOSIS — M549 Dorsalgia, unspecified: Secondary | ICD-10-CM | POA: Insufficient documentation

## 2018-04-30 MED ORDER — CYCLOBENZAPRINE HCL 5 MG PO TABS: 5.0000 mg | ORAL_TABLET | Freq: Three times a day (TID) | ORAL | 0 refills | Status: DC | PRN

## 2018-04-30 MED ORDER — IBUPROFEN 800 MG PO TABS: 800.0000 mg | ORAL_TABLET | Freq: Three times a day (TID) | ORAL | 0 refills | Status: DC

## 2018-04-30 NOTE — ED Notes (Signed)
C/o right mid back pain.  Occurred about one week ago during sexual intercourse.  Reports pain has not gotten any better, only applied OTC cream without relief

## 2018-04-30 NOTE — ED Provider Notes (Signed)
[facility] EMERGENCY DEPARTMENT Provider Note   CSN: [id number] Arrival date & time: 04/30/18  1201     History   Chief Complaint Chief Complaint  Patient presents with  . Back Pain    HPI Terry Mayo is a 63 y.o. male with a history of arthritis and kidney stones presenting with a one-week history of right sided back pain which is worsened with movement and palpation and started gradually during intercourse 1 week ago.  He denies any weakness or numbness in his legs, denies urinary or fecal incontinence or retention.  He denies a history of problems with back pain or injury and has had no midline back pain.  He also denies dysuria, hematuria or increased urinary frequency.  Denies abdominal pain, also no fevers or chills.  Denies IV drug use, no history of cancer.  He did apply a topical pain medicine cream (name unknown) which was not effective.  The history is provided by the patient.    Past Medical History:  Diagnosis Date  . Arthritis   . Kidney stones     Patient Active Problem List   Diagnosis Date Noted  . Colon cancer screening   . Loss of weight 09/04/2015  . Encounter for screening colonoscopy 09/04/2015    Past Surgical History:  Procedure Laterality Date  . CHOLECYSTECTOMY  1990s  . COLONOSCOPY WITH PROPOFOL N/A 09/30/2015   Procedure: COLONOSCOPY WITH PROPOFOL;  Surgeon: Corbin Adeobert M Rourk, MD;  Location: [facility];  Service: Endoscopy;  Laterality: N/A;  930  . KIDNEY STONE SURGERY  2005        Home Medications    Prior to Admission medications   Medication Sig Start Date End Date Taking? Authorizing Provider  acetaminophen (TYLENOL) 650 MG CR tablet Take 650 mg by mouth every 8 (eight) hours as needed for pain.    [provider]  cetirizine (ZYRTEC ALLERGY) 10 MG tablet Take 1 tablet (10 mg total) by mouth daily. 07/18/17   Ofilia Neaslark, Michael L, PA-C  cyclobenzaprine (FLEXERIL) 5 MG tablet Take 1 tablet (5 mg total) by mouth 3 (three) times  daily as needed for muscle spasms. 04/30/18   Burgess AmorIdol, Shiloh Southern, PA-C  ibuprofen (ADVIL,MOTRIN) 800 MG tablet Take 1 tablet (800 mg total) by mouth 3 (three) times daily. 04/30/18   Burgess AmorIdol, Bradie Sangiovanni, PA-C    Family History Family History  Problem Relation Age of Onset  . Colon cancer Neg Hx     Social History Social History   Tobacco Use  . Smoking status: Former Smoker    Last attempt to quit: 08/05/2015    Years since quitting: 2.7  . Smokeless tobacco: Never Used  Substance Use Topics  . Alcohol use: Yes    Alcohol/week: 0.0 standard drinks    Comment: No ETOH in a month. Variable, up to daily, as much as much as a fifth of liquor per sitting.  . Drug use: No    Frequency: 7.0 times per week    Comment: As often as daily when he does smoke marijuana     Allergies   Penicillins   Review of Systems Review of Systems  Constitutional: Negative for fever.  Respiratory: Negative for shortness of breath.   Cardiovascular: Negative for chest pain and leg swelling.  Gastrointestinal: Negative for abdominal distention, abdominal pain and constipation.  Genitourinary: Negative for difficulty urinating, dysuria, flank pain, frequency and urgency.  Musculoskeletal: Positive for back pain. Negative for gait problem and joint swelling.  Skin:  Negative for rash.  Neurological: Negative for weakness and numbness.     Physical Exam Updated Vital Signs BP 134/81 (BP Location: Right Arm)   Pulse 64   Temp 97.6 F (36.4 C) (Oral)   Resp 18   Ht 6\' 2"  (1.88 m)   Wt 90.7 kg   SpO2 100%   BMI 25.68 kg/m   Physical Exam Vitals signs and nursing note reviewed.  Constitutional:      Appearance: He is well-developed.  HENT:     Head: Normocephalic.  Eyes:     Conjunctiva/sclera: Conjunctivae normal.  Neck:     Musculoskeletal: Normal range of motion and neck supple.  Cardiovascular:     Rate and Rhythm: Normal rate.     Comments: Pedal pulses normal. Pulmonary:     Effort: Pulmonary  effort is normal.  Abdominal:     General: Bowel sounds are normal. There is no distension.     Palpations: Abdomen is soft. There is no mass.  Musculoskeletal: Normal range of motion.     Thoracic back: He exhibits tenderness. He exhibits no bony tenderness, no swelling and no edema.     Lumbar back: He exhibits tenderness. He exhibits no bony tenderness, no swelling, no edema and no spasm.       Back:     Comments: Right parathoracic and paralumbar tenderness to palpation.  There is no palpable deformity, edema.  Skin is intact and normal in appearance, no rashes.  Skin:    General: Skin is warm and dry.  Neurological:     Mental Status: He is alert.     Sensory: No sensory deficit.     Motor: No tremor or atrophy.     Gait: Gait normal.     Deep Tendon Reflexes:     Reflex Scores:      Patellar reflexes are 2+ on the right side and 2+ on the left side.      Achilles reflexes are 2+ on the right side and 2+ on the left side.    Comments: No strength deficit noted in hip and knee flexor and extensor muscle groups.  Ankle flexion and extension intact.      ED Treatments / Results  Labs (all labs ordered are listed, but only abnormal results are displayed) Labs Reviewed - No data to display  EKG None  Radiology No results found.  Procedures Procedures (including critical care time)  Medications Ordered in ED Medications - No data to display   Initial Impression / Assessment and Plan / ED Course  I have reviewed the triage vital signs and the nursing notes.  Pertinent labs & imaging results that were available during my care of the patient were reviewed by me and considered in my medical decision making (see chart for details).    Exam and history suggesting parathoracic and para lumbar muscle strain.  Patient has no midline thoracic or lumbar pain to palpation.  He also has no neuro deficits or radicular pain.  He was placed on ibuprofen and Flexeril.  We discussed  home treatments including heat therapy and activity as tolerated.  Plan follow-up with his PCP in 1 week if symptoms are not improving.  Final Clinical Impressions(s) / ED Diagnoses   Final diagnoses:  Acute right-sided back pain, unspecified back location    ED Discharge Orders         Ordered    ibuprofen (ADVIL,MOTRIN) 800 MG tablet  3 times daily  04/30/18 1320    cyclobenzaprine (FLEXERIL) 5 MG tablet  3 times daily PRN     04/30/18 1320           Burgess Amordol, Demoni Gergen, Cordelia Poche-C 04/30/18 1321    Loren RacerYelverton, David, MD 04/30/18 1434

## 2018-04-30 NOTE — Discharge Instructions (Addendum)
As discussed, use the medications as prescribed in addition to applying a heating pad to your area pain for 20 minutes 3-4 times daily.  Use caution with the Flexeril as this can make you drowsy.  Plan to see your doctor for recheck of your symptoms if your pain is not improved over the next week.  Avoid activity that makes your pain worse.

## 2018-04-30 NOTE — ED Triage Notes (Signed)
Pt states the right side of his back has been hurting since having sex about a week ago.

## 2018-07-01 ENCOUNTER — Ambulatory Visit: Payer: BLUE CROSS/BLUE SHIELD | Admitting: Gastroenterology

## 2018-07-04 ENCOUNTER — Encounter: Payer: Self-pay | Admitting: Internal Medicine

## 2020-08-19 ENCOUNTER — Encounter: Payer: Self-pay | Admitting: Emergency Medicine

## 2020-08-19 ENCOUNTER — Ambulatory Visit
Admission: EM | Admit: 2020-08-19 | Discharge: 2020-08-19 | Disposition: A | Discharge status: Home / Self Care | Payer: Medicaid Other | Attending: Family Medicine | Admitting: Family Medicine

## 2020-08-19 ENCOUNTER — Other Ambulatory Visit: Payer: Self-pay

## 2020-08-19 DIAGNOSIS — J209 Acute bronchitis, unspecified: Secondary | ICD-10-CM | POA: Diagnosis not present

## 2020-08-19 MED ORDER — DOXYCYCLINE HYCLATE 100 MG PO CAPS: 100.0000 mg | ORAL_CAPSULE | Freq: Two times a day (BID) | ORAL | 0 refills | Status: DC

## 2020-08-19 MED ORDER — PREDNISONE 20 MG PO TABS: 40.0000 mg | ORAL_TABLET | Freq: Every day | ORAL | 0 refills | Status: DC

## 2020-08-19 MED ORDER — PROMETHAZINE-DM 6.25-15 MG/5ML PO SYRP: 5.0000 mL | ORAL_SOLUTION | Freq: Four times a day (QID) | ORAL | 0 refills | Status: DC | PRN

## 2020-08-19 NOTE — ED Triage Notes (Signed)
Cough, sneezing, cold chills, nasal congestion all over back pain when coughing x 1week

## 2020-08-19 NOTE — ED Provider Notes (Signed)
[facility]    CSN: [id number] Arrival date & time: 08/19/20  1125      History   Chief Complaint No chief complaint on file.   HPI Terry Mayo is a 65 y.o. male.   HPI  Patient presents with URI symptoms including cough, sneezing, chills, nasal congestion, runny nose, and generalized body aches. Denies worrisome symptoms of shortness of breath, wheezing, weakness, N&V, or chest tightness. Past Medical History:  Diagnosis Date  . Arthritis   . Kidney stones     Patient Active Problem List   Diagnosis Date Noted  . Colon cancer screening   . Loss of weight 09/04/2015  . Encounter for screening colonoscopy 09/04/2015    Past Surgical History:  Procedure Laterality Date  . CHOLECYSTECTOMY  1990s  . COLONOSCOPY WITH PROPOFOL N/A 09/30/2015   Procedure: COLONOSCOPY WITH PROPOFOL;  Surgeon: Corbin Ade, MD;  Location: [facility];  Service: Endoscopy;  Laterality: N/A;  930  . KIDNEY STONE SURGERY  2005       Home Medications    Prior to Admission medications   Medication Sig Start Date End Date Taking? Authorizing Provider  doxycycline (VIBRAMYCIN) 100 MG capsule Take 1 capsule (100 mg total) by mouth 2 (two) times daily. 08/19/20  Yes Bing Neighbors, FNP  predniSONE (DELTASONE) 20 MG tablet Take 2 tablets (40 mg total) by mouth daily with breakfast. 08/19/20  Yes Bing Neighbors, FNP  promethazine-dextromethorphan (PROMETHAZINE-DM) 6.25-15 MG/5ML syrup Take 5 mLs by mouth 4 (four) times daily as needed for cough. 08/19/20  Yes Bing Neighbors, FNP  acetaminophen (TYLENOL) 650 MG CR tablet Take 650 mg by mouth every 8 (eight) hours as needed for pain.    [provider]  cetirizine (ZYRTEC ALLERGY) 10 MG tablet Take 1 tablet (10 mg total) by mouth daily. 07/18/17   Ofilia Neas, PA-C  cyclobenzaprine (FLEXERIL) 5 MG tablet Take 1 tablet (5 mg total) by mouth 3 (three) times daily as needed for muscle spasms. 04/30/18   Burgess Amor, PA-C   ibuprofen (ADVIL,MOTRIN) 800 MG tablet Take 1 tablet (800 mg total) by mouth 3 (three) times daily. 04/30/18   Burgess Amor, PA-C    Family History Family History  Problem Relation Age of Onset  . Colon cancer Neg Hx     Social History Social History   Tobacco Use  . Smoking status: Former Smoker    Quit date: 08/05/2015    Years since quitting: 5.0  . Smokeless tobacco: Never Used  Substance Use Topics  . Alcohol use: Not Currently    Alcohol/week: 0.0 standard drinks    Comment: No ETOH in a month. Variable, up to daily, as much as much as a fifth of liquor per sitting.  . Drug use: No    Frequency: 7.0 times per week    Comment: As often as daily when he does smoke marijuana     Allergies   Penicillins   Review of Systems Review of Systems Pertinent negatives listed in HPI  Physical Exam Triage Vital Signs ED Triage Vitals [08/19/20 1157]  Enc Vitals Group     BP (!) 146/98     Pulse Rate 74     Resp 19     Temp 99 F (37.2 C)     Temp Source Oral     SpO2 96 %     Weight      Height      Head Circumference  Peak Flow      Pain Score      Pain Loc      Pain Edu?      Excl. in GC?    No data found.  Updated Vital Signs BP (!) 146/98 (BP Location: Right Arm)   Pulse 74   Temp 99 F (37.2 C) (Oral)   Resp 19   SpO2 96%   Visual Acuity Right Eye Distance:   Left Eye Distance:   Bilateral Distance:    Right Eye Near:   Left Eye Near:    Bilateral Near:     Physical Exam  General Appearance:    Alert, cooperative, no distress  HENT:   Normocephalic, ears normal, nares mucosal edema with congestion, rhinorrhea, oropharynx clear   Eyes:    PERRL, conjunctiva/corneas clear, EOM's intact       Lungs:     Clear to auscultation bilaterally, respirations unlabored  Heart:    Regular rate and rhythm  Neurologic:   Awake, alert, oriented x 3. No apparent focal neurological           defect.      UC Treatments / Results  Labs (all labs  ordered are listed, but only abnormal results are displayed) Labs Reviewed - No data to display  EKG   Radiology No results found.  Procedures Procedures (including critical care time)  Medications Ordered in UC Medications - No data to display  Initial Impression / Assessment and Plan / UC Course  I have reviewed the triage vital signs and the nursing notes.  Pertinent labs & imaging results that were available during my care of the patient were reviewed by me and considered in my medical decision making (see chart for details).     Acute bronchitis, treatment per discharge instruction. Hydrate well with fluids and rest. Tylenol for body aches. ER precautions if Red flag symptoms develop. Final Clinical Impressions(s) / UC Diagnoses   Final diagnoses:  Acute bronchitis, unspecified organism   Discharge Instructions   None    ED Prescriptions    Medication Sig Dispense Auth. Provider   doxycycline (VIBRAMYCIN) 100 MG capsule Take 1 capsule (100 mg total) by mouth 2 (two) times daily. 20 capsule Bing Neighbors, FNP   predniSONE (DELTASONE) 20 MG tablet Take 2 tablets (40 mg total) by mouth daily with breakfast. 10 tablet Bing Neighbors, FNP   promethazine-dextromethorphan (PROMETHAZINE-DM) 6.25-15 MG/5ML syrup Take 5 mLs by mouth 4 (four) times daily as needed for cough. 140 mL Bing Neighbors, FNP     PDMP not reviewed this encounter.   Bing Neighbors, [place] 08/22/20 [id number]

## 2021-09-26 ENCOUNTER — Ambulatory Visit
Admission: EM | Admit: 2021-09-26 | Discharge: 2021-09-26 | Disposition: A | Discharge status: Home / Self Care | Payer: Medicare HMO | Attending: Nurse Practitioner | Admitting: Nurse Practitioner

## 2021-09-26 DIAGNOSIS — J069 Acute upper respiratory infection, unspecified: Secondary | ICD-10-CM | POA: Diagnosis not present

## 2021-09-26 MED ORDER — PROMETHAZINE-DM 6.25-15 MG/5ML PO SYRP: 5.0000 mL | ORAL_SOLUTION | Freq: Every evening | ORAL | 0 refills | Status: DC | PRN

## 2021-09-26 NOTE — Discharge Instructions (Addendum)
Your symptoms and exam findings are most consistent with a viral upper respiratory infection. These usually run their course in about 10 days.  If your symptoms last longer than 10 days without improvement, please follow up with your primary care provider.  If your symptoms, worsen, please go to the Emergency Room.    Some things that can make you feel better are: - Increased rest - Increasing fluid with water/sugar free electrolytes - Acetaminophen and ibuprofen as needed for fever/pain.  - Salt water gargling, chloraseptic spray and throat lozenges - OTC guaifenesin (Mucinex).  - Saline sinus flushes or a neti pot.  - Humidifying the air. - You can also use cough syrup as needed at night time.

## 2021-09-26 NOTE — ED Triage Notes (Signed)
Pt states he has a dry cough and it makes his chest and back pain  Pt states his throat is sore and he has some wheezing   Denies meds

## 2021-09-26 NOTE — ED Provider Notes (Signed)
[facility]    CSN: [id number] Arrival date & time: 09/26/21  1554      History   Chief Complaint Chief Complaint  Patient presents with   Back Pain    Fatigue, neck and back pain    HPI Terry Mayo is a 66 y.o. male.   Patient presents with 5 days of congested cough, sore throat, chest tightness with cough, nasal congestion, chest congestion, postnasal drainage, diarrhea, and wheezing at nighttime when he lays down.  He has pain in his back when he coughs.  He denies shortness of breath, chest pain, sinus pain, headache, tooth or ear pain, eye redness or drainage, nausea/vomiting, change in appetite.  He reports decreased energy.  He denies any sick contacts.  He has been taking Alka-Seltzer which she does not think has been helping.  He reports he took a COVID test at home that was negative.    Past Medical History:  Diagnosis Date   Arthritis    Kidney stones     Patient Active Problem List   Diagnosis Date Noted   Colon cancer screening    Loss of weight 09/04/2015   Encounter for screening colonoscopy 09/04/2015    Past Surgical History:  Procedure Laterality Date   CHOLECYSTECTOMY  1990s   COLONOSCOPY WITH PROPOFOL N/A 09/30/2015   Procedure: COLONOSCOPY WITH PROPOFOL;  Surgeon: Corbin Adeobert M Rourk, MD;  Location: [facility];  Service: Endoscopy;  Laterality: N/A;  930   KIDNEY STONE SURGERY  2005       Home Medications    Prior to Admission medications   Medication Sig Start Date End Date Taking? Authorizing Provider  acetaminophen (TYLENOL) 650 MG CR tablet Take 650 mg by mouth every 8 (eight) hours as needed for pain.    [provider]  cetirizine (ZYRTEC ALLERGY) 10 MG tablet Take 1 tablet (10 mg total) by mouth daily. 07/18/17   Ofilia Neaslark, Michael L, PA-C  cyclobenzaprine (FLEXERIL) 5 MG tablet Take 1 tablet (5 mg total) by mouth 3 (three) times daily as needed for muscle spasms. 04/30/18   Burgess AmorIdol, Julie, PA-C  ibuprofen (ADVIL,MOTRIN) 800 MG  tablet Take 1 tablet (800 mg total) by mouth 3 (three) times daily. 04/30/18   Burgess AmorIdol, Julie, PA-C  promethazine-dextromethorphan (PROMETHAZINE-DM) 6.25-15 MG/5ML syrup Take 5 mLs by mouth at bedtime as needed for cough. Do not take while driving or operating heavy machinery 09/26/21   Valentino NoseMartinez, Janette Harvie A, NP    Family History Family History  Problem Relation Age of Onset   Colon cancer Neg Hx     Social History Social History   Tobacco Use   Smoking status: Former    Types: Cigarettes    Quit date: 08/05/2015    Years since quitting: 6.1   Smokeless tobacco: Never  Vaping Use   Vaping Use: Never used  Substance Use Topics   Alcohol use: Not Currently    Alcohol/week: 0.0 standard drinks    Comment: No ETOH in a month. Variable, up to daily, as much as much as a fifth of liquor per sitting.   Drug use: No    Frequency: 7.0 times per week    Comment: As often as daily when he does smoke marijuana     Allergies   Penicillins   Review of Systems Review of Systems Per HPI  Physical Exam Triage Vital Signs ED Triage Vitals  Enc Vitals Group     BP 09/26/21 1603 117/76  Pulse Rate 09/26/21 1603 79     Resp 09/26/21 1603 20     Temp 09/26/21 1603 97.9 F (36.6 C)     Temp Source 09/26/21 1603 Oral     SpO2 09/26/21 1603 98 %     Weight --      Height --      Head Circumference --      Peak Flow --      Pain Score 09/26/21 1600 7     Pain Loc --      Pain Edu? --      Excl. in GC? --    No data found.  Updated Vital Signs BP 117/76 (BP Location: Right Arm)   Pulse 79   Temp 97.9 F (36.6 C) (Oral)   Resp 20   SpO2 98%   Visual Acuity Right Eye Distance:   Left Eye Distance:   Bilateral Distance:    Right Eye Near:   Left Eye Near:    Bilateral Near:     Physical Exam Vitals and nursing note reviewed.  Constitutional:      General: He is not in acute distress.    Appearance: Normal appearance. He is not ill-appearing or toxic-appearing.  HENT:      Head: Normocephalic and atraumatic.     Right Ear: Tympanic membrane, ear canal and external ear normal. There is no impacted cerumen.     Left Ear: Tympanic membrane, ear canal and external ear normal. There is no impacted cerumen.     Nose: Congestion and rhinorrhea present.     Mouth/Throat:     Mouth: Mucous membranes are moist.     Pharynx: Oropharynx is clear. Posterior oropharyngeal erythema present. No oropharyngeal exudate.  Eyes:     General: No scleral icterus.    Extraocular Movements: Extraocular movements intact.  Cardiovascular:     Rate and Rhythm: Normal rate and regular rhythm.  Pulmonary:     Effort: Pulmonary effort is normal. No respiratory distress.     Breath sounds: Normal breath sounds. No wheezing, rhonchi or rales.  Abdominal:     General: Abdomen is flat. Bowel sounds are normal. There is no distension.     Palpations: Abdomen is soft.  Musculoskeletal:     Cervical back: Normal range of motion and neck supple.  Lymphadenopathy:     Cervical: No cervical adenopathy.  Skin:    General: Skin is warm and dry.     Capillary Refill: Capillary refill takes less than 2 seconds.     Coloration: Skin is not jaundiced or pale.     Findings: No erythema or rash.  Neurological:     Mental Status: He is alert and oriented to person, place, and time.  Psychiatric:        Behavior: Behavior is cooperative.     UC Treatments / Results  Labs (all labs ordered are listed, but only abnormal results are displayed) Labs Reviewed - No data to display  EKG   Radiology No results found.  Procedures Procedures (including critical care time)  Medications Ordered in UC Medications - No data to display  Initial Impression / Assessment and Plan / UC Course  I have reviewed the triage vital signs and the nursing notes.  Pertinent labs & imaging results that were available during my care of the patient were reviewed by me and considered in my medical decision  making (see chart for details).    Reassured patient that symptoms and exam findings  are most consistent with a viral upper respiratory infection and explained lack of efficacy of antibiotics against viruses.  Patient declines COVID-19 and influenza testing today.  Discussed expected course and features suggestive of secondary bacterial infection.  Continue supportive care. Increase fluid intake with water or electrolyte solution like pedialyte. Encouraged acetaminophen as needed for fever/pain. Encouraged salt water gargling, chloraseptic spray and throat lozenges. Encouraged OTC guaifenesin. Encouraged saline sinus flushes and/or neti with humidified air.  Can use cough suppressant at night time as needed for cough.  Seek care if symptoms persist or worsen despite treatment.    Final Clinical Impressions(s) / UC Diagnoses   Final diagnoses:  Viral URI with cough     Discharge Instructions      Your symptoms and exam findings are most consistent with a viral upper respiratory infection. These usually run their course in about 10 days.  If your symptoms last longer than 10 days without improvement, please follow up with your primary care provider.  If your symptoms, worsen, please go to the Emergency Room.    Some things that can make you feel better are: - Increased rest - Increasing fluid with water/sugar free electrolytes - Acetaminophen and ibuprofen as needed for fever/pain.  - Salt water gargling, chloraseptic spray and throat lozenges - OTC guaifenesin (Mucinex).  - Saline sinus flushes or a neti pot.  - Humidifying the air. - You can also use cough syrup as needed at night time.        ED Prescriptions     Medication Sig Dispense Auth. Provider   promethazine-dextromethorphan (PROMETHAZINE-DM) 6.25-15 MG/5ML syrup Take 5 mLs by mouth at bedtime as needed for cough. Do not take while driving or operating heavy machinery 118 mL Valentino Nose, NP      PDMP not  reviewed this encounter.   Valentino Nose, NP 09/26/21 1620

## 2021-10-15 ENCOUNTER — Other Ambulatory Visit: Payer: Self-pay | Admitting: Nurse Practitioner

## 2021-12-04 ENCOUNTER — Emergency Department (HOSPITAL_COMMUNITY): Payer: Medicare HMO

## 2021-12-04 ENCOUNTER — Encounter (HOSPITAL_COMMUNITY): Payer: Self-pay

## 2021-12-04 ENCOUNTER — Other Ambulatory Visit: Payer: Self-pay

## 2021-12-04 ENCOUNTER — Encounter (HOSPITAL_COMMUNITY)
Admission: EM | Disposition: A | Discharge status: Home / Self Care | Payer: Self-pay | Source: Home / Self Care | Attending: Cardiology

## 2021-12-04 ENCOUNTER — Other Ambulatory Visit (HOSPITAL_COMMUNITY): Payer: Medicare HMO

## 2021-12-04 ENCOUNTER — Encounter (HOSPITAL_COMMUNITY): Payer: Self-pay | Admitting: Emergency Medicine

## 2021-12-04 ENCOUNTER — Inpatient Hospital Stay (HOSPITAL_COMMUNITY)
Admission: EM | Admit: 2021-12-04 | Discharge: 2021-12-06 | DRG: 281 | Disposition: A | Discharge status: Home / Self Care | Payer: Medicare HMO | Attending: Cardiology | Admitting: Cardiology

## 2021-12-04 DIAGNOSIS — R0789 Other chest pain: Secondary | ICD-10-CM | POA: Diagnosis present

## 2021-12-04 DIAGNOSIS — I255 Ischemic cardiomyopathy: Secondary | ICD-10-CM | POA: Diagnosis present

## 2021-12-04 DIAGNOSIS — I25110 Atherosclerotic heart disease of native coronary artery with unstable angina pectoris: Secondary | ICD-10-CM | POA: Diagnosis not present

## 2021-12-04 DIAGNOSIS — I2510 Atherosclerotic heart disease of native coronary artery without angina pectoris: Secondary | ICD-10-CM | POA: Diagnosis present

## 2021-12-04 DIAGNOSIS — I10 Essential (primary) hypertension: Secondary | ICD-10-CM | POA: Diagnosis present

## 2021-12-04 DIAGNOSIS — E782 Mixed hyperlipidemia: Secondary | ICD-10-CM | POA: Diagnosis not present

## 2021-12-04 DIAGNOSIS — R9389 Abnormal findings on diagnostic imaging of other specified body structures: Secondary | ICD-10-CM

## 2021-12-04 DIAGNOSIS — Z8249 Family history of ischemic heart disease and other diseases of the circulatory system: Secondary | ICD-10-CM

## 2021-12-04 DIAGNOSIS — Z720 Tobacco use: Secondary | ICD-10-CM

## 2021-12-04 DIAGNOSIS — F17210 Nicotine dependence, cigarettes, uncomplicated: Secondary | ICD-10-CM | POA: Diagnosis present

## 2021-12-04 DIAGNOSIS — I472 Ventricular tachycardia, unspecified: Secondary | ICD-10-CM | POA: Diagnosis present

## 2021-12-04 DIAGNOSIS — I214 Non-ST elevation (NSTEMI) myocardial infarction: Secondary | ICD-10-CM | POA: Diagnosis present

## 2021-12-04 DIAGNOSIS — Z87442 Personal history of urinary calculi: Secondary | ICD-10-CM | POA: Diagnosis not present

## 2021-12-04 DIAGNOSIS — M1990 Unspecified osteoarthritis, unspecified site: Secondary | ICD-10-CM | POA: Diagnosis present

## 2021-12-04 DIAGNOSIS — Z79899 Other long term (current) drug therapy: Secondary | ICD-10-CM | POA: Diagnosis not present

## 2021-12-04 DIAGNOSIS — Z9049 Acquired absence of other specified parts of digestive tract: Secondary | ICD-10-CM | POA: Diagnosis not present

## 2021-12-04 DIAGNOSIS — E785 Hyperlipidemia, unspecified: Secondary | ICD-10-CM | POA: Diagnosis present

## 2021-12-04 DIAGNOSIS — I4729 Other ventricular tachycardia: Secondary | ICD-10-CM

## 2021-12-04 HISTORY — PX: LEFT HEART CATH AND CORONARY ANGIOGRAPHY: CATH118249

## 2021-12-04 HISTORY — DX: Hyperlipidemia, unspecified: E78.5

## 2021-12-04 HISTORY — DX: Essential (primary) hypertension: I10

## 2021-12-04 LAB — CBC
HCT: 47.1 % (ref 39.0–52.0)
HCT: 34 % — ABNORMAL LOW (ref 39.0–52.0)
Hemoglobin: 16.4 g/dL (ref 13.0–17.0)
Hemoglobin: 12.1 g/dL — ABNORMAL LOW (ref 13.0–17.0)
MCH: 32.3 pg (ref 26.0–34.0)
MCH: 33.1 pg (ref 26.0–34.0)
MCHC: 34.8 g/dL (ref 30.0–36.0)
MCHC: 35.6 g/dL (ref 30.0–36.0)
MCV: 92.9 fL (ref 80.0–100.0)
MCV: 92.9 fL (ref 80.0–100.0)
Platelets: 178 10*3/uL (ref 150–400)
Platelets: 138 10*3/uL — ABNORMAL LOW (ref 150–400)
RBC: 5.07 MIL/uL (ref 4.22–5.81)
RBC: 3.66 MIL/uL — ABNORMAL LOW (ref 4.22–5.81)
RDW: 14 % (ref 11.5–15.5)
RDW: 13.8 % (ref 11.5–15.5)
WBC: 10 10*3/uL (ref 4.0–10.5)
WBC: 10.8 10*3/uL — ABNORMAL HIGH (ref 4.0–10.5)
nRBC: 0 % (ref 0.0–0.2)
nRBC: 0 % (ref 0.0–0.2)

## 2021-12-04 LAB — BASIC METABOLIC PANEL
Anion gap: 8 (ref 5–15)
BUN: 12 mg/dL (ref 8–23)
CO2: 23 mmol/L (ref 22–32)
Calcium: 8.9 mg/dL (ref 8.9–10.3)
Chloride: 108 mmol/L (ref 98–111)
Creatinine, Ser: 1.02 mg/dL (ref 0.61–1.24)
GFR, Estimated: >60 mL/min (ref >60)
Glucose, Bld: 141 mg/dL — ABNORMAL HIGH (ref 70–99)
Potassium: 3.7 mmol/L (ref 3.5–5.1)
Sodium: 139 mmol/L (ref 135–145)

## 2021-12-04 LAB — TROPONIN I (HIGH SENSITIVITY)
Troponin I (High Sensitivity): 53 ng/L — ABNORMAL HIGH (ref <18)
Troponin I (High Sensitivity): 573 ng/L (ref <18)
Troponin I (High Sensitivity): 2143 ng/L (ref <18)

## 2021-12-04 LAB — HEPATIC FUNCTION PANEL
ALT: 28 U/L (ref 0–44)
AST: 38 U/L (ref 15–41)
Albumin: 4.1 g/dL (ref 3.5–5.0)
Alkaline Phosphatase: 84 U/L (ref 38–126)
Bilirubin, Direct: 0.3 mg/dL — ABNORMAL HIGH (ref 0.0–0.2)
Indirect Bilirubin: 0.7 mg/dL (ref 0.3–0.9)
Total Bilirubin: 1 mg/dL (ref 0.3–1.2)
Total Protein: 7.6 g/dL (ref 6.5–8.1)

## 2021-12-04 LAB — D-DIMER, QUANTITATIVE: D-Dimer, Quant: 0.38 ug/mL-FEU (ref 0.00–0.50)

## 2021-12-04 LAB — CREATININE, SERUM
Creatinine, Ser: 0.76 mg/dL (ref 0.61–1.24)
GFR, Estimated: >60 mL/min (ref >60)

## 2021-12-04 LAB — BRAIN NATRIURETIC PEPTIDE: B Natriuretic Peptide: 48 pg/mL (ref 0.0–100.0)

## 2021-12-04 LAB — HIV ANTIBODY (ROUTINE TESTING W REFLEX): HIV Screen 4th Generation wRfx: NONREACTIVE

## 2021-12-04 SURGERY — LEFT HEART CATH AND CORONARY ANGIOGRAPHY
Anesthesia: LOCAL

## 2021-12-04 MED ORDER — HEPARIN (PORCINE) IN NACL 1000-0.9 UT/500ML-% IV SOLN
INTRAVENOUS | Status: DC | PRN
  Administered 2021-12-04 (×2): 500 mL

## 2021-12-04 MED ORDER — ASPIRIN 81 MG PO CHEW
324.0000 mg | CHEWABLE_TABLET | Freq: Once | ORAL | Status: AC
  Administered 2021-12-04: 324 mg via ORAL
  Filled 2021-12-04: qty 4

## 2021-12-04 MED ORDER — LOSARTAN POTASSIUM 25 MG PO TABS
25.0000 mg | ORAL_TABLET | Freq: Every day | ORAL | Status: DC
  Administered 2021-12-05 – 2021-12-06 (×2): 25 mg via ORAL
  Filled 2021-12-04 (×2): qty 1

## 2021-12-04 MED ORDER — MIDAZOLAM HCL 2 MG/2ML IJ SOLN
INTRAMUSCULAR | Status: DC | PRN
  Administered 2021-12-04: 1 mg via INTRAVENOUS

## 2021-12-04 MED ORDER — ASPIRIN 81 MG PO CHEW: 81.0000 mg | CHEWABLE_TABLET | Freq: Every day | ORAL | Status: DC

## 2021-12-04 MED ORDER — OXYCODONE HCL 5 MG PO TABS
5.0000 mg | ORAL_TABLET | ORAL | Status: DC | PRN
  Administered 2021-12-05: 10 mg via ORAL
  Filled 2021-12-04: qty 2

## 2021-12-04 MED ORDER — SODIUM CHLORIDE 0.9 % IV SOLN: 250.0000 mL | INTRAVENOUS | Status: DC | PRN

## 2021-12-04 MED ORDER — MORPHINE SULFATE (PF) 2 MG/ML IV SOLN
2.0000 mg | INTRAVENOUS | Status: DC | PRN
  Administered 2021-12-04 (×2): 2 mg via INTRAVENOUS
  Filled 2021-12-04 (×2): qty 1

## 2021-12-04 MED ORDER — ASPIRIN 81 MG PO TBEC: 81.0000 mg | DELAYED_RELEASE_TABLET | Freq: Every day | ORAL | Status: DC

## 2021-12-04 MED ORDER — LIDOCAINE HCL (PF) 1 % IJ SOLN
INTRAMUSCULAR | Status: AC
  Filled 2021-12-04: qty 30

## 2021-12-04 MED ORDER — ACETAMINOPHEN 325 MG PO TABS: 650.0000 mg | ORAL_TABLET | ORAL | Status: DC | PRN

## 2021-12-04 MED ORDER — VERAPAMIL HCL 2.5 MG/ML IV SOLN
INTRAVENOUS | Status: DC | PRN
  Administered 2021-12-04: 10 mL via INTRA_ARTERIAL

## 2021-12-04 MED ORDER — SODIUM CHLORIDE 0.9% FLUSH
3.0000 mL | Freq: Two times a day (BID) | INTRAVENOUS | Status: DC
  Administered 2021-12-05: 3 mL via INTRAVENOUS

## 2021-12-04 MED ORDER — HEPARIN SODIUM (PORCINE) 5000 UNIT/ML IJ SOLN
5000.0000 [IU] | Freq: Three times a day (TID) | INTRAMUSCULAR | Status: DC
  Administered 2021-12-04 – 2021-12-06 (×5): 5000 [IU] via SUBCUTANEOUS
  Filled 2021-12-04 (×5): qty 1

## 2021-12-04 MED ORDER — LIDOCAINE HCL (PF) 1 % IJ SOLN
INTRAMUSCULAR | Status: DC | PRN
  Administered 2021-12-04: 2 mL via INTRADERMAL

## 2021-12-04 MED ORDER — NITROGLYCERIN IN D5W 200-5 MCG/ML-% IV SOLN
5.0000 ug/min | INTRAVENOUS | Status: DC
  Administered 2021-12-04: 50 ug/min via INTRAVENOUS
  Administered 2021-12-04: 40 ug/min via INTRAVENOUS
  Administered 2021-12-04: 5 ug/min via INTRAVENOUS
  Filled 2021-12-04 (×2): qty 250

## 2021-12-04 MED ORDER — ASPIRIN 81 MG PO TBEC
81.0000 mg | DELAYED_RELEASE_TABLET | Freq: Every day | ORAL | Status: DC
  Administered 2021-12-05 – 2021-12-06 (×2): 81 mg via ORAL
  Filled 2021-12-04 (×2): qty 1

## 2021-12-04 MED ORDER — HEPARIN (PORCINE) 25000 UT/250ML-% IV SOLN
1200.0000 [IU]/h | INTRAVENOUS | Status: DC
  Administered 2021-12-04: 1200 [IU]/h via INTRAVENOUS
  Filled 2021-12-04: qty 250

## 2021-12-04 MED ORDER — MORPHINE SULFATE (PF) 2 MG/ML IV SOLN: 2.0000 mg | Freq: Once | INTRAVENOUS | Status: DC

## 2021-12-04 MED ORDER — SODIUM CHLORIDE 0.9 % IV SOLN: INTRAVENOUS | Status: DC

## 2021-12-04 MED ORDER — HYDRALAZINE HCL 20 MG/ML IJ SOLN: 10.0000 mg | INTRAMUSCULAR | Status: AC | PRN

## 2021-12-04 MED ORDER — SENNOSIDES-DOCUSATE SODIUM 8.6-50 MG PO TABS
1.0000 | ORAL_TABLET | Freq: Every day | ORAL | Status: DC | PRN
  Administered 2021-12-04: 1 via ORAL
  Filled 2021-12-04: qty 1

## 2021-12-04 MED ORDER — ATORVASTATIN CALCIUM 80 MG PO TABS
80.0000 mg | ORAL_TABLET | Freq: Every day | ORAL | Status: DC
  Administered 2021-12-05: 80 mg via ORAL
  Filled 2021-12-04 (×2): qty 1

## 2021-12-04 MED ORDER — SODIUM CHLORIDE 0.9 % IV SOLN
250.0000 mL | INTRAVENOUS | Status: DC | PRN
Start: 2021-12-04 — End: 2021-12-06

## 2021-12-04 MED ORDER — SODIUM CHLORIDE 0.9 % IV SOLN: INTRAVENOUS | Status: AC

## 2021-12-04 MED ORDER — NITROGLYCERIN 0.4 MG SL SUBL: 0.4000 mg | SUBLINGUAL_TABLET | SUBLINGUAL | Status: DC | PRN

## 2021-12-04 MED ORDER — ONDANSETRON HCL 4 MG/2ML IJ SOLN
4.0000 mg | Freq: Once | INTRAMUSCULAR | Status: AC
  Administered 2021-12-04: 4 mg via INTRAVENOUS
  Filled 2021-12-04: qty 2

## 2021-12-04 MED ORDER — HEPARIN BOLUS VIA INFUSION
4000.0000 [IU] | Freq: Once | INTRAVENOUS | Status: AC
  Administered 2021-12-04: 4000 [IU] via INTRAVENOUS

## 2021-12-04 MED ORDER — NITROGLYCERIN 1 MG/10 ML FOR IR/CATH LAB
INTRA_ARTERIAL | Status: DC | PRN
  Administered 2021-12-04: 200 ug via INTRACORONARY

## 2021-12-04 MED ORDER — SODIUM CHLORIDE 0.9% FLUSH: 3.0000 mL | Freq: Two times a day (BID) | INTRAVENOUS | Status: DC

## 2021-12-04 MED ORDER — SODIUM CHLORIDE 0.9% FLUSH: 3.0000 mL | INTRAVENOUS | Status: DC | PRN

## 2021-12-04 MED ORDER — FENTANYL CITRATE (PF) 100 MCG/2ML IJ SOLN
INTRAMUSCULAR | Status: DC | PRN
  Administered 2021-12-04: 25 ug via INTRAVENOUS

## 2021-12-04 MED ORDER — ATORVASTATIN CALCIUM 40 MG PO TABS: 40.0000 mg | ORAL_TABLET | Freq: Every day | ORAL | Status: DC

## 2021-12-04 MED ORDER — CLOPIDOGREL BISULFATE 75 MG PO TABS
75.0000 mg | ORAL_TABLET | Freq: Every day | ORAL | Status: DC
Start: 2021-12-05 — End: 2021-12-06
  Administered 2021-12-05 – 2021-12-06 (×2): 75 mg via ORAL
  Filled 2021-12-04 (×2): qty 1

## 2021-12-04 MED ORDER — ONDANSETRON HCL 4 MG/2ML IJ SOLN
4.0000 mg | Freq: Four times a day (QID) | INTRAMUSCULAR | Status: DC | PRN
  Administered 2021-12-05: 4 mg via INTRAVENOUS
  Filled 2021-12-04: qty 2

## 2021-12-04 MED ORDER — ONDANSETRON HCL 4 MG/2ML IJ SOLN: 4.0000 mg | Freq: Four times a day (QID) | INTRAMUSCULAR | Status: DC | PRN

## 2021-12-04 MED ORDER — ATORVASTATIN CALCIUM 80 MG PO TABS
80.0000 mg | ORAL_TABLET | Freq: Every day | ORAL | Status: DC
  Administered 2021-12-04: 80 mg via ORAL
  Filled 2021-12-04: qty 2

## 2021-12-04 MED ORDER — NITROGLYCERIN 0.4 MG SL SUBL
0.4000 mg | SUBLINGUAL_TABLET | SUBLINGUAL | Status: AC | PRN
  Administered 2021-12-04 (×3): 0.4 mg via SUBLINGUAL
  Filled 2021-12-04 (×2): qty 1

## 2021-12-04 MED ORDER — HEPARIN SODIUM (PORCINE) 1000 UNIT/ML IJ SOLN
INTRAMUSCULAR | Status: DC | PRN
  Administered 2021-12-04: 5000 [IU] via INTRAVENOUS

## 2021-12-04 MED ORDER — IOHEXOL 350 MG/ML SOLN
INTRAVENOUS | Status: DC | PRN
  Administered 2021-12-04: 120 mL

## 2021-12-04 MED ORDER — LABETALOL HCL 5 MG/ML IV SOLN: 10.0000 mg | INTRAVENOUS | Status: AC | PRN

## 2021-12-04 SURGICAL SUPPLY — 13 items
BAND CMPR LRG ZPHR (HEMOSTASIS) ×1
BAND ZEPHYR COMPRESS 30 LONG (HEMOSTASIS) ×1 IMPLANT
CATH 5FR JL3.5 JR4 ANG PIG MP (CATHETERS) ×1 IMPLANT
CATH INFINITI 5FR AL1 (CATHETERS) ×1 IMPLANT
CATH INFINITI 5FR MPB2 (CATHETERS) ×1 IMPLANT
GLIDESHEATH SLEND A-KIT 6F 22G (SHEATH) ×1 IMPLANT
GUIDEWIRE INQWIRE 1.5J.035X260 (WIRE) IMPLANT
INQWIRE 1.5J .035X260CM (WIRE) ×2
KIT HEART LEFT (KITS) ×3 IMPLANT
PACK CARDIAC CATHETERIZATION (CUSTOM PROCEDURE TRAY) ×3 IMPLANT
SHEATH GLIDE SLENDER 4/5FR (SHEATH) IMPLANT
TRANSDUCER W/STOPCOCK (MISCELLANEOUS) ×3 IMPLANT
TUBING CIL FLEX 10 FLL-RA (TUBING) ×4 IMPLANT

## 2021-12-04 NOTE — ED Triage Notes (Signed)
Pt c/o chest pain with sob and left arm pain that started tonight.

## 2021-12-04 NOTE — Progress Notes (Signed)
  Transition of Care [facility]) Screening Note   Patient Details  Name: [name] Date of Birth: 12-18-1955   Transition of Care [facility]) CM/SW Contact:    Delilah Shan, LCSWA Phone Number: 12/04/2021, 3:30 PM    Transition of Care Department [facility]) has reviewed patient and no TOC needs have been identified at this time. We will continue to monitor patient advancement through interdisciplinary progression rounds. If new patient transition needs arise, please place a TOC consult.

## 2021-12-04 NOTE — Progress Notes (Signed)
Brief cardiology note: Spoke with ER, accepted patient for transfer to [facility] for cath. The bed situation is currently very tight, so we are going to see if there is a way we can bring him straight to the lab. Will discuss with Dr. Wyline Mood at [facility] if he can help facilitate this. For now keep NPO, keep heparin and nitro. Has received 324 mg aspirin. We will bring to [facility] as soon as able.  Jodelle Red, MD, PhD, [facility]  [facility]  [place]  [facility] at [facility] [address], [address] [place], [place] [address] [phone]

## 2021-12-04 NOTE — Progress Notes (Signed)
ANTICOAGULATION CONSULT NOTE - Initial Consult  Pharmacy Consult for Heparin  Indication: chest pain/ACS  Allergies  Allergen Reactions   Penicillins     REACTION UNKNOWN Has patient had a PCN reaction causing immediate rash, facial/tongue/throat swelling, SOB or lightheadedness with hypotension: UNKNOWN Has patient had a PCN reaction causing severe rash involving mucus membranes or skin necrosis: UNKNOWN Has patient had a PCN reaction that required hospitalization UNKNOWN Has patient had a PCN reaction occurring within the last 10 years: UNKNOWN If all of the above answers are "NO", then may proceed with Cephalosporin use.     Patient Measurements: Height: 6\' 2"  (188 cm) Weight: 90.7 kg (200 lb) IBW/kg (Calculated) : 82.2  Vital Signs: Temp: 97.6 F (36.4 C) (08/10 0327) BP: 138/74 (08/10 0600) Pulse Rate: 87 (08/10 0600)  Labs: Recent Labs    12/04/21 0340  HGB 16.4  HCT 47.1  PLT 178  CREATININE 1.02  TROPONINIHS 53*    Estimated Creatinine Clearance: 81.7 mL/min (by C-G formula based on SCr of 1.02 mg/dL).   Medical History: Past Medical History:  Diagnosis Date   Arthritis    Kidney stones      Assessment: 66 y/o M with chest pain and mildly elevated troponin. Starting heparin. CBC/renal function good. PTA meds reviewed.   Goal of Therapy:  Heparin level 0.3-0.7 units/ml Monitor platelets by anticoagulation protocol: Yes   Plan:  Heparin 4000 units BOLUS Start heparin drip at 1200 units/hr 1400 Heparin level Daily CBC/Heparin level Monitor for bleeding  79, PharmD, BCPS Clinical Pharmacist Phone: [phone]

## 2021-12-04 NOTE — H&P (Addendum)
Cardiology Admission History and Physical:   Patient ID: Terry Mayo: [id number]; DOB: 1955-09-04   Admission date: 12/04/2021  PCP:  [facility]   [facility] Providers Cardiologist: New to [facility]  Chief Complaint: Chest Pain  Patient Profile:   JUSITN Mayo is a 66 y.o. male with past medical history of HTN and HLD who is being seen 12/04/2021 for the evaluation of an NSTEMI at the request of Dr. Elpidio Anis.  History of Present Illness:   [name] presented to [facility] ED during the early morning hours of 12/04/2021 for evaluation of chest pain, dyspnea and left arm pain which started the evening prior to arrival.   In talking with the patient, his brother and his daughter today, he reports being in his usual state of health until last night around midnight when he developed a tightness along his left pectoral region which radiated into his left arm. His pain persisted for several hours which prompted him to come to the ED for further evaluation. He reports associated nausea with this. No vomiting. He says he is typically active at baseline around his home and and has not experienced any recent chest pain but does have baseline dyspnea on exertion. No recent orthopnea, PND or pitting edema. No known personal history of CAD or CHF. His sister recently had a cardiac stent placed. He is a current smoker and smokes approximately 1 pack/day  Initial labs show WBC 10.0, Hgb 16.4, platelets 178, Na+ 139, K+ 3.7 and creatinine 1.02. BNP 48. D-dimer negative. Initial Hs Troponin 53 with repeat of 573. CXR shows a patchy-ill defined opacity in the right infrahilar lung which may represent atelectasis or PNA. EKG shows sinus bradycardia, HR 51 with ST elevation along the precordial leads, improved on repeat tracing around 0527.   He received ASA 324 mg and has been started on IV Heparin and IV NTG.    Past Medical History:  Diagnosis Date   Arthritis    Essential  hypertension    Hyperlipidemia    Kidney stones     Past Surgical History:  Procedure Laterality Date   CHOLECYSTECTOMY  1990s   COLONOSCOPY WITH PROPOFOL N/A 09/30/2015   Procedure: COLONOSCOPY WITH PROPOFOL;  Surgeon: Corbin Ade, MD;  Location: [facility];  Service: Endoscopy;  Laterality: N/A;  930   KIDNEY STONE SURGERY  2005     Medications Prior to Admission: Prior to Admission medications   Medication Sig Start Date End Date Taking? Authorizing Provider  acetaminophen (TYLENOL) 650 MG CR tablet Take 650 mg by mouth every 8 (eight) hours as needed for pain.   Yes [provider]  atorvastatin (LIPITOR) 40 MG tablet Take 40 mg by mouth daily. 11/09/21  Yes [provider]  losartan (COZAAR) 25 MG tablet Take 25 mg by mouth daily. 11/25/21  Yes [provider]  cetirizine (ZYRTEC ALLERGY) 10 MG tablet Take 1 tablet (10 mg total) by mouth daily. Patient not taking: Reported on 12/04/2021 07/18/17   Terry Neas, PA-C  cyclobenzaprine (FLEXERIL) 5 MG tablet Take 1 tablet (5 mg total) by mouth 3 (three) times daily as needed for muscle spasms. Patient not taking: Reported on 12/04/2021 04/30/18   Burgess Amor, PA-C  ibuprofen (ADVIL,MOTRIN) 800 MG tablet Take 1 tablet (800 mg total) by mouth 3 (three) times daily. Patient not taking: Reported on 12/04/2021 04/30/18   Burgess Amor, PA-C  promethazine-dextromethorphan (PROMETHAZINE-DM) 6.25-15 MG/5ML syrup Take 5 mLs  by mouth at bedtime as needed for cough. Do not take while driving or operating heavy machinery Patient not taking: Reported on 12/04/2021 09/26/21   Eulogio Bear, NP     Allergies:    Allergies  Allergen Reactions   Penicillins     REACTION UNKNOWN Has patient had a PCN reaction causing immediate rash, facial/tongue/throat swelling, SOB or lightheadedness with hypotension: UNKNOWN Has patient had a PCN reaction causing severe rash involving mucus membranes or skin necrosis: UNKNOWN Has  patient had a PCN reaction that required hospitalization UNKNOWN Has patient had a PCN reaction occurring within the last 10 years: UNKNOWN If all of the above answers are "NO", then may proceed with Cephalosporin use.     Social History:   Social History   Socioeconomic History   Marital status: Single    Spouse name: Not on file   Number of children: Not on file   Years of education: Not on file   Highest education level: Not on file  Occupational History   Not on file  Tobacco Use   Smoking status: Former    Types: Cigarettes    Quit date: 08/05/2015    Years since quitting: 6.3   Smokeless tobacco: Never  Vaping Use   Vaping Use: Never used  Substance and Sexual Activity   Alcohol use: Not Currently    Alcohol/week: 0.0 standard drinks of alcohol    Comment: No ETOH in a month. Variable, up to daily, as much as much as a fifth of liquor per sitting.   Drug use: No    Frequency: 7.0 times per week    Comment: As often as daily when he does smoke marijuana   Sexual activity: Yes    Birth control/protection: None  Other Topics Concern   Not on file  Social History Narrative   Not on file   Social Determinants of Health   Financial Resource Strain: Not on file  Food Insecurity: Not on file  Transportation Needs: Not on file  Physical Activity: Not on file  Stress: Not on file  Social Connections: Not on file  Intimate Partner Violence: Not on file    Family History:   The patient's family history includes Coronary artery disease in his sister. There is no history of Colon cancer.    ROS:  Please see the history of present illness.   All other ROS reviewed and negative.     Physical Exam/Data:   Vitals:   12/04/21 0815 12/04/21 0830 12/04/21 0845 12/04/21 0900  BP:      Pulse: (!) 51 65 69 (!) 53  Resp: (!) 22 (!) 28 (!) 31 20  Temp:      SpO2: 97% 99% 97% 98%  Weight:      Height:       No intake or output data in the 24 hours ending 12/04/21  0946    12/04/2021    3:27 AM 04/30/2018   12:05 PM 09/26/2015    7:55 AM  Last 3 Weights  Weight (lbs) 200 lb 200 lb 190 lb  Weight (kg) 90.719 kg 90.719 kg 86.183 kg     Body mass index is 25.68 kg/m.  General:  Well nourished, well developed male appearing in no acute distress HEENT: normal Neck: no JVD Vascular: No carotid bruits; Distal pulses 2+ bilaterally   Cardiac:  normal S1, S2; RRR; no murmur  Lungs:  clear to auscultation bilaterally, no wheezing, rhonchi or rales  Abd: soft, nontender,  no hepatomegaly  Ext: no pitting edema Musculoskeletal:  No deformities, BUE and BLE strength normal and equal Skin: warm and dry  Neuro:  CNs 2-12 intact, no focal abnormalities noted Psych:  Normal affect    EKG:  The ECG that was done was personally reviewed and demonstrates: Sinus bradycardia, HR 51 with ST elevation along the precordial leads, improved on repeat tracing around 0527.   Relevant CV Studies:  Echocardiogram: Pending  Laboratory Data:  High Sensitivity Troponin:   Recent Labs  Lab 12/04/21 0340 12/04/21 0619 12/04/21 0859  TROPONINIHS 53* 573* 2,143*      Chemistry Recent Labs  Lab 12/04/21 0340  NA 139  K 3.7  CL 108  CO2 23  GLUCOSE 141*  BUN 12  CREATININE 1.02  CALCIUM 8.9  GFRNONAA >60  ANIONGAP 8    Recent Labs  Lab 12/04/21 0340  PROT 7.6  ALBUMIN 4.1  AST 38  ALT 28  ALKPHOS 84  BILITOT 1.0   Lipids No results for input(s): "CHOL", "TRIG", "HDL", "LABVLDL", "LDLCALC", "CHOLHDL" in the last 168 hours. Hematology Recent Labs  Lab 12/04/21 0340  WBC 10.0  RBC 5.07  HGB 16.4  HCT 47.1  MCV 92.9  MCH 32.3  MCHC 34.8  RDW 14.0  PLT 178   Thyroid No results for input(s): "TSH", "FREET4" in the last 168 hours. BNP Recent Labs  Lab 12/04/21 0340  BNP 48.0    DDimer  Recent Labs  Lab 12/04/21 0340  DDIMER 0.38     Radiology/Studies:  DG Chest Portable 1 View  Result Date: 12/04/2021 CLINICAL DATA:  Chest pain.   Shortness of breath. EXAM: PORTABLE CHEST 1 VIEW COMPARISON:  07/15/2015 FINDINGS: Lower lung volumes from prior exam. There is patchy ill-defined opacity in the right infrahilar lung. Heart size upper normal with normal mediastinal contours. No pulmonary edema. No pleural effusion or pneumothorax. No acute osseous abnormalities are seen. IMPRESSION: Patchy ill-defined opacity in the right infrahilar lung, may be atelectasis or pneumonia. Electronically Signed   By: Narda Rutherford M.D.   On: 12/04/2021 03:59     Assessment and Plan:   1. NSTEMI - He presented with chest pain/left arm pain which started around midnight. Still having some pain at this time but improved since being started on IV Heparin and IV NTG.  - Initial Hs Troponin 53 with repeat of 573. EKG shows sinus bradycardia, HR 51 with ST elevation along the precordial leads, improved on repeat tracing around 0527. Will order an echocardiogram. Also check FLP and Hgb A1c for risk stratification. - Reviewed with the patient and will plan for a cardiac catheterization today for definitive evaluation. The patient understands that risks include but are not limited to stroke (1 in 1000), death (1 in 1000), kidney failure [usually temporary] (1 in 500), bleeding (1 in 200), allergic reaction [possibly serious] (1 in 200).   - Continue Heparin and IV NTG. I will ask his nurse to titrate NTG given his continued chest pain. Start ASA 81mg  daily (already received 324mg  today so will start tomorrow) and continue statin therapy. No beta-blocker given HR in the 50's.   2. HTN - BP has been variable from 132/92 - 156/84 since arrival. Remains on IV NTG and will order PTA Losartan 25mg  daily (to restart tomorrow given cath today).   3. HLD - Will recheck an FLP. Continue PTA Atorvastatin 40mg  daily.   4. Abnormal CXR/Tobacco Use - CXR shows a patchy-ill defined opacity in the  right infrahilar lung which may represent atelectasis or PNA. No recent  fever or chills and WBC is normal. Continue to follow clinically and can check a Procalcitonin if indicated. He does smoke 1 ppd and cessation has been advised.    Risk Assessment/Risk Scores:    TIMI Risk Score for Unstable Angina or Non-ST Elevation MI:   The patient's TIMI risk score is 5, which indicates a 26% risk of all cause mortality, new or recurrent myocardial infarction or need for urgent revascularization in the next 14 days.   For questions or updates, please contact [facility] Please consult [email or web address] for contact info under     Signed, Ellsworth Lennox, PA-C  12/04/2021 9:46 AM   Addendum Patient seen and discussed with PA Iran Ouch, I agree with her documentation. 66 yo male no known prior cardiac history but CAD risk factors including HTN, HL, age, tobacco presents with chest pain.     K 3.7 Cr 1.02 BUN 12 WBC 10 Hgb 16.4 Plt 178 BNP 48 Ddimer 0.38  Trop 53-->573-->2143 CXR patch righ infrahilar lung, atelectis vs pneumonia EKG initial SR, upsloping anterior/anterolateral ST elevation without reciprocal changes  Patient presents with NSTEMI, trop up to 2143 without peak. Echo is pending. Medical therapy with ASA, hep gtt, nitro gtt, change atorva 40mg  to 80mg . Low HRs at times hold on beta blocker, can resume his losartan after cath. Plan for tranfer to for cath today   MD

## 2021-12-04 NOTE — CV Procedure (Signed)
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  Stop never found.  Apical LAD fills by collaterals from right coronary.  First diagonal is large and bifurcates.  Moderate disease is noted in the proximal segments of each branch.  Circumflex gives origin to 3 obtuse marginals.  The distal circumflex before the fourth obtuse marginal is totally occluded and there are normal collaterals that fill the vessel late.  RCA was difficult to identify but was noted to arise deep in the right sinus of Valsalva has moderate diffuse disease proximal and mid.  His supplies Perry apical collaterals to the LAD. Mild to moderate anteroapical hypokinesis.  EF 45 to 50%.  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  Patient is asymptomatic.  No clear stump is noted.  Medical therapy recommended.

## 2021-12-04 NOTE — ED Provider Notes (Signed)
Patient'sReceived signout from previous attending.  66 year old with hypertension hyperlipidemia who presented with chest pain, shortness of breath, vomiting and diaphoresis.  EKG with mild ST elevation V3 through V5 with no reciprocal changes.  Initial troponin elevated at 573 consistent with NSTEMI.  Started on heparin and nitro infusions with resolution of pain.  Repeat EKG with improvement of ST elevations.  Plan to page Dr. Wyline Mood cardiologist at [facility] around 9 AM if patient is still in the ED while awaiting admission to cardiology team at [facility].   Mardene Sayer, MD 12/04/21 1531

## 2021-12-04 NOTE — Interval H&P Note (Signed)
Cath Lab Visit (complete for each Cath Lab visit)  Clinical Evaluation Leading to the Procedure:   ACS: Yes.    Non-ACS:    Anginal Classification: CCS III  Anti-ischemic medical therapy: Minimal Therapy (1 class of medications)  Non-Invasive Test Results: No non-invasive testing performed  Prior CABG: No previous CABG      History and Physical Interval Note:  12/04/2021 12:19 PM  Terry Mayo  has presented today for surgery, with the diagnosis of nstemi.  The various methods of treatment have been discussed with the patient and family. After consideration of risks, benefits and other options for treatment, the patient has consented to  Procedure(s): LEFT HEART CATH AND CORONARY ANGIOGRAPHY (N/A) as a surgical intervention.  The patient's history has been reviewed, patient examined, no change in status, stable for surgery.  I have reviewed the patient's chart and labs.  Questions were answered to the patient's satisfaction.     Lyn Records III

## 2021-12-04 NOTE — ED Provider Notes (Signed)
[facility] EMERGENCY DEPARTMENT Provider Note   CSN: [id number] Arrival date & time: 12/04/21  0315     History  Chief Complaint  Patient presents with   Chest Pain    Terry Mayo is a 66 y.o. male.  Six 3-year-old male with a history of hypertension and hyperlipidemia who presents the ER today secondary to chest pain.  Patient states he has some left-sided chest pressure earlier in the night while resting.  He had little bit of shortness of breath with it.  Throughout the night it got worse he started having emesis and diaphoresis.  No lightheadedness.  Did start having some achiness in his left arm somewhere along the way as well.  Was concerned for his heart so presents here for further evaluation.  No lower extremity swelling.  No history of heart disease.  No known family history of heart disease.  He smokes marijuana occasionally but not regularly.  Drinks alcohol occasionally did have a glass of wine yesterday but was less than 6 ounces.  No illicit drugs outside of marijuana.   Chest Pain      Home Medications Prior to Admission medications   Medication Sig Start Date End Date Taking? Authorizing Provider  acetaminophen (TYLENOL) 650 MG CR tablet Take 650 mg by mouth every 8 (eight) hours as needed for pain.   Yes [provider]  atorvastatin (LIPITOR) 40 MG tablet Take 40 mg by mouth daily. 11/09/21  Yes [provider]  losartan (COZAAR) 25 MG tablet Take 25 mg by mouth daily. 11/25/21  Yes [provider]  cetirizine (ZYRTEC ALLERGY) 10 MG tablet Take 1 tablet (10 mg total) by mouth daily. Patient not taking: Reported on 12/04/2021 07/18/17   Ofilia Neas, PA-C  cyclobenzaprine (FLEXERIL) 5 MG tablet Take 1 tablet (5 mg total) by mouth 3 (three) times daily as needed for muscle spasms. Patient not taking: Reported on 12/04/2021 04/30/18   Burgess Amor, PA-C  ibuprofen (ADVIL,MOTRIN) 800 MG tablet Take 1 tablet (800 mg total) by mouth 3 (three)  times daily. Patient not taking: Reported on 12/04/2021 04/30/18   Burgess Amor, PA-C  promethazine-dextromethorphan (PROMETHAZINE-DM) 6.25-15 MG/5ML syrup Take 5 mLs by mouth at bedtime as needed for cough. Do not take while driving or operating heavy machinery Patient not taking: Reported on 12/04/2021 09/26/21   Valentino Nose, NP      Allergies    Penicillins    Review of Systems   Review of Systems  Cardiovascular:  Positive for chest pain.    Physical Exam Updated Vital Signs BP 127/86   Pulse (!) 58   Temp (!) 97.5 F (36.4 C)   Resp (!) 24   Ht 6\' 2"  (1.88 m)   Wt 90.7 kg   SpO2 97%   BMI 25.68 kg/m  Physical Exam Vitals and nursing note reviewed.  Constitutional:      Appearance: He is well-developed.  HENT:     Head: Normocephalic and atraumatic.  Cardiovascular:     Rate and Rhythm: Normal rate.  Pulmonary:     Effort: Pulmonary effort is normal. No respiratory distress.  Chest:     Chest wall: No mass or tenderness.  Abdominal:     General: There is no distension or abdominal bruit.     Palpations: Abdomen is soft. There is no fluid wave.  Musculoskeletal:        General: Normal range of motion.     Cervical back: Normal range of  motion.  Skin:    General: Skin is warm and dry.  Neurological:     Mental Status: He is alert.     ED Results / Procedures / Treatments   Labs (all labs ordered are listed, but only abnormal results are displayed) Labs Reviewed  BASIC METABOLIC PANEL - Abnormal; Notable for the following components:      Result Value   Glucose, Bld 141 (*)    All other components within normal limits  HEPATIC FUNCTION PANEL - Abnormal; Notable for the following components:   Bilirubin, Direct 0.3 (*)    All other components within normal limits  TROPONIN I (HIGH SENSITIVITY) - Abnormal; Notable for the following components:   Troponin I (High Sensitivity) 53 (*)    All other components within normal limits  TROPONIN I (HIGH  SENSITIVITY) - Abnormal; Notable for the following components:   Troponin I (High Sensitivity) 573 (*)    All other components within normal limits  CBC  BRAIN NATRIURETIC PEPTIDE  D-DIMER, QUANTITATIVE  HEPARIN LEVEL (UNFRACTIONATED)    EKG EKG Interpretation  Date/Time:  Thursday December 04 2021 03:25:33 EDT Ventricular Rate:  51 PR Interval:  146 QRS Duration: 93 QT Interval:  464 QTC Calculation: 428 R Axis:   4 Text Interpretation: Sinus rhythm ST elevation, consider anterior injury ST elevation in V3-6 new from 2017 Confirmed by Marily Memos [phone]) on 12/04/2021 3:41:25 AM   EKG Interpretation  Date/Time:  Thursday December 04 2021 05:27:56 EDT Ventricular Rate:  55 PR Interval:  152 QRS Duration: 89 QT Interval:  461 QTC Calculation: 441 R Axis:   -26 Text Interpretation: Sinus rhythm Borderline left axis deviation Minimal ST elevation, anterior leads improved ST changes from earlier in night Confirmed by Marily Memos [phone]) on 12/04/2021 6:02:32 AM         Radiology DG Chest Portable 1 View  Result Date: 12/04/2021 CLINICAL DATA:  Chest pain.  Shortness of breath. EXAM: PORTABLE CHEST 1 VIEW COMPARISON:  07/15/2015 FINDINGS: Lower lung volumes from prior exam. There is patchy ill-defined opacity in the right infrahilar lung. Heart size upper normal with normal mediastinal contours. No pulmonary edema. No pleural effusion or pneumothorax. No acute osseous abnormalities are seen. IMPRESSION: Patchy ill-defined opacity in the right infrahilar lung, may be atelectasis or pneumonia. Electronically Signed   By: Narda Rutherford M.D.   On: 12/04/2021 03:59    Procedures .Critical Care  Performed by: Marily Memos, MD Authorized by: Marily Memos, MD   Critical care provider statement:    Critical care time (minutes):  30   Critical care was necessary to treat or prevent imminent or life-threatening deterioration of the following conditions:  Cardiac failure    Critical care was time spent personally by me on the following activities:  Development of treatment plan with patient or surrogate, discussions with consultants, evaluation of patient's response to treatment, examination of patient, ordering and review of laboratory studies, ordering and review of radiographic studies, ordering and performing treatments and interventions, pulse oximetry, re-evaluation of patient's condition and review of old charts     Medications Ordered in ED Medications  nitroGLYCERIN 50 mg in dextrose 5 % 250 mL (0.2 mg/mL) infusion (45 mcg/min Intravenous Rate/Dose Change 12/04/21 0652)  heparin ADULT infusion 100 units/mL (25000 units/252mL) (1,200 Units/hr Intravenous New Bag/Given 12/04/21 0530)  nitroGLYCERIN (NITROSTAT) SL tablet 0.4 mg (0.4 mg Sublingual Given 12/04/21 0416)  aspirin chewable tablet 324 mg (324 mg Oral Given 12/04/21 0357)  ondansetron (ZOFRAN)  injection 4 mg (4 mg Intravenous Given 12/04/21 0406)  heparin bolus via infusion 4,000 Units (4,000 Units Intravenous Bolus from Bag 12/04/21 0530)    ED Course/ Medical Decision Making/ A&P Clinical Course as of 12/04/21 0738  Thu Dec 04, 2021  0356 Discussed with cardiology, Dr. Wilmon Pali regarding story, ECG changes and CXR. Does not want to activate STEMI at this time. Will continue with normal cardiac workup and reconsult as necessary.  [JM]  0356 DG Chest Portable 1 View Interstitial changes on my viewing and interpretation, likely b line in right lung vs small amount of fluid in the fissure, no obvious effusions [JM]  0516 Troponin I (High Sensitivity)(!) Will start heparin/NTG. Stil think ACS is most likely, will await second troponin to decide if he can stay here but may need transfer to [facility] for cardiology workup.  [JM]  0517 Glucose(!): 141 [JM]  0517 Bilirubin, Direct(!): 0.3 [JM]  0517 B Natriuretic Peptide: 48.0 reassuring [JM]  [id number] D-Dimer, Quant: 0.38 Unlikely PE as this is reassuring [JM]   0600 EKG 12-Lead Repeat ecg with improved ST elevations but still present. Will await second troponin for decision on where admission. [JM]  0708 Troponin I (High Sensitivity)(!!) Already on heparin and NGT, cards already consulted [JM]  [phone] Discussed w/ Dr. Harrell Gave at [facility] who agrees with admission for likely catheterization at [facility], temp bed request placed. She requests that local cardiologist see patient if still in ED and decide if ED to ED transfer is necessary if he doesn't have a bed. Also if any more testing can be done here at [facility]. She requests update if troponin getting higher, cp returns and not able to be controlled with NTG titration.  [JM]    Clinical Course User Index [JM] Kden Wagster, Corene Cornea, MD                           Medical Decision Making Amount and/or Complexity of Data Reviewed Labs: ordered. Decision-making details documented in ED Course. Radiology: ordered. Decision-making details documented in ED Course. ECG/medicine tests: ordered. Decision-making details documented in ED Course.  Risk OTC drugs. Prescription drug management. Decision regarding hospitalization.   Final Clinical Impression(s) / ED Diagnoses Final diagnoses:  NSTEMI (non-ST elevated myocardial infarction) [facility])    Rx / [place] Orders ED Discharge Orders     None         Adaria Hole, Corene Cornea, MD 12/04/21 [phone]

## 2021-12-04 NOTE — ED Notes (Signed)
Chest pain resolved. Pain 7-10 in left arm.

## 2021-12-05 ENCOUNTER — Inpatient Hospital Stay (HOSPITAL_COMMUNITY): Payer: Medicare HMO

## 2021-12-05 ENCOUNTER — Encounter (HOSPITAL_COMMUNITY): Payer: Self-pay | Admitting: Cardiology

## 2021-12-05 DIAGNOSIS — I255 Ischemic cardiomyopathy: Secondary | ICD-10-CM

## 2021-12-05 DIAGNOSIS — I10 Essential (primary) hypertension: Secondary | ICD-10-CM | POA: Diagnosis not present

## 2021-12-05 DIAGNOSIS — I214 Non-ST elevation (NSTEMI) myocardial infarction: Secondary | ICD-10-CM | POA: Diagnosis not present

## 2021-12-05 DIAGNOSIS — I25110 Atherosclerotic heart disease of native coronary artery with unstable angina pectoris: Secondary | ICD-10-CM

## 2021-12-05 DIAGNOSIS — I4729 Other ventricular tachycardia: Secondary | ICD-10-CM

## 2021-12-05 DIAGNOSIS — I2510 Atherosclerotic heart disease of native coronary artery without angina pectoris: Secondary | ICD-10-CM

## 2021-12-05 DIAGNOSIS — E785 Hyperlipidemia, unspecified: Secondary | ICD-10-CM

## 2021-12-05 LAB — ECHOCARDIOGRAM COMPLETE
Area-P 1/2: 2.87 cm2
Height: 74 in
S' Lateral: 4 cm
Single Plane A4C EF: 36.7 %
Weight: 3200 oz

## 2021-12-05 LAB — BASIC METABOLIC PANEL
Anion gap: 6 (ref 5–15)
BUN: 7 mg/dL — ABNORMAL LOW (ref 8–23)
CO2: 21 mmol/L — ABNORMAL LOW (ref 22–32)
Calcium: 8.4 mg/dL — ABNORMAL LOW (ref 8.9–10.3)
Chloride: 109 mmol/L (ref 98–111)
Creatinine, Ser: 0.84 mg/dL (ref 0.61–1.24)
GFR, Estimated: >60 mL/min (ref >60)
Glucose, Bld: 129 mg/dL — ABNORMAL HIGH (ref 70–99)
Potassium: 4 mmol/L (ref 3.5–5.1)
Sodium: 136 mmol/L (ref 135–145)

## 2021-12-05 LAB — CBC
HCT: 39.9 % (ref 39.0–52.0)
Hemoglobin: 14.2 g/dL (ref 13.0–17.0)
MCH: 33.1 pg (ref 26.0–34.0)
MCHC: 35.6 g/dL (ref 30.0–36.0)
MCV: 93 fL (ref 80.0–100.0)
Platelets: 151 10*3/uL (ref 150–400)
RBC: 4.29 MIL/uL (ref 4.22–5.81)
RDW: 13.5 % (ref 11.5–15.5)
WBC: 12.5 10*3/uL — ABNORMAL HIGH (ref 4.0–10.5)
nRBC: 0 % (ref 0.0–0.2)

## 2021-12-05 LAB — LIPID PANEL
Cholesterol: 99 mg/dL (ref 0–200)
HDL: 50 mg/dL (ref >40)
LDL Cholesterol: 41 mg/dL (ref 0–99)
Total CHOL/HDL Ratio: 2 RATIO
Triglycerides: 41 mg/dL (ref <150)
VLDL: 8 mg/dL (ref 0–40)

## 2021-12-05 LAB — HEMOGLOBIN A1C
Hgb A1c MFr Bld: 5.5 % (ref 4.8–5.6)
Mean Plasma Glucose: 111.15 mg/dL

## 2021-12-05 LAB — MAGNESIUM: Magnesium: 1.9 mg/dL (ref 1.7–2.4)

## 2021-12-05 MED ORDER — SPIRONOLACTONE 12.5 MG HALF TABLET
12.5000 mg | ORAL_TABLET | Freq: Every day | ORAL | Status: DC
  Administered 2021-12-05 – 2021-12-06 (×2): 12.5 mg via ORAL
  Filled 2021-12-05 (×2): qty 1

## 2021-12-05 MED ORDER — MELATONIN 3 MG PO TABS
3.0000 mg | ORAL_TABLET | Freq: Every day | ORAL | Status: DC
  Administered 2021-12-05: 3 mg via ORAL
  Filled 2021-12-05: qty 1

## 2021-12-05 MED ORDER — ISOSORBIDE MONONITRATE ER 30 MG PO TB24
15.0000 mg | ORAL_TABLET | Freq: Every day | ORAL | Status: DC
  Administered 2021-12-05 – 2021-12-06 (×2): 15 mg via ORAL
  Filled 2021-12-05 (×2): qty 1

## 2021-12-05 MED ORDER — METOPROLOL SUCCINATE ER 25 MG PO TB24
12.5000 mg | ORAL_TABLET | Freq: Every day | ORAL | Status: DC
  Administered 2021-12-05 – 2021-12-06 (×2): 12.5 mg via ORAL
  Filled 2021-12-05 (×2): qty 1

## 2021-12-05 NOTE — Discharge Summary (Incomplete)
Discharge Summary    Patient ID: CALVON LOVINGER MRN: [id number]; DOB: February 28, 1956  Admit date: 12/04/2021 Discharge date: 12/06/2021  PCP:  [facility] Cardiologist:  New (Dr. Carlyle Dolly)  Discharge Diagnoses    Principal Problem:   NSTEMI (non-ST elevated myocardial infarction) [facility]) Active Problems:   CAD (coronary artery disease)   Ischemic cardiomyopathy   NSVT (nonsustained ventricular tachycardia) ([place])   Hypertension   Hyperlipidemia    Diagnostic Studies/Procedures    Left Cardiac Catheterization 12/04/2021: Conclusions: On presentation to the Cath Lab, no chest discomfort since 6 AM this morning.  The left coronary anatomy is ambiguous because of multiple diagonals as ramus light branches.  But we can never find the stump.  There are faint hint of thrombus distal or around the origin of the large septal perforator.  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 Left main is widely patent Circumflex gives origin to 2 large obtuse marginal branches that arise from a common stent.  The more medial branch is larger and has moderate proximal disease up to 60%. Right coronary contains codominant anatomy and moderate mid vessel disease up to 60 to 70% in the mid vessel.  It supplies collaterals around the apex to the LAD. Left ventriculography demonstrates inferoapical and apical moderate hypokinesis.  EF 40 to 50%.  LVEDP upper normal at 17 mmHg.   Recommend: Aspirin and Plavix for at least 6 months. Aggressive lipid-lowering.  Nitroglycerin if recurrent chest pain.   Diagnostic Dominance: Co-dominant    _____________  Echocardiogram 12/04/2021:   1. False tendon in the apical LV of no clinical significance. Left  ventricular ejection fraction, by estimation, is 45 to 50%. The left  ventricle has mildly decreased function. The left ventricle demonstrates  regional wall motion  abnormalities (see  scoring diagram/findings for description). Left ventricular diastolic  parameters are indeterminate. There is akinesis of the left ventricular,  apical septal wall and anterior wall. There is hypokinesis of the left  ventricular, mid anterior wall. There is   akinesis of the left ventricular, apical segment. There is akinesis of  the left ventricular, mid anteroseptal wall.   2. Right ventricular systolic function is normal. The right ventricular  size is normal. A Prominent moderator band is visualized. There is normal  pulmonary artery systolic pressure. The estimated right ventricular  systolic pressure is [id number] mmHg.   3. The mitral valve is normal in structure. Trivial mitral valve  regurgitation. No evidence of mitral stenosis.   4. The aortic valve is normal in structure. Aortic valve regurgitation is  not visualized. No aortic stenosis is present.   5. The inferior vena cava is dilated in size with >50% respiratory  variability, suggesting right atrial pressure of 8 mmHg.     History of Present Illness     NISHAWN HERSTON is a 66 y.o. male with a history of hypertension and hyperlipidemia but no known cardiac disease prior to admission.  He presented to the [facility] ED on 12/04/2021 for evaluation of chest pain with radiation to left arm and ruled in for NSTEMI and was ultimately transferred to same-day to [facility] for further evaluation and management including cardiac catheterization.  Hospital Course     Consultants: None  NSTEMI CAD Ischemic Cardiomyopathy - Presented with chest tightness that radiated down his left arm cessation nausea.  -EKG showed sinus bradycardia with ST elevation  along the precordial leads that improved on repeat tracing.  -High-sensitivity troponin elevated at 53 >> 573 >> 2,143.   -LHC showed ambiguous of coronary anatomy with a multiple diagonals as ramus light branches.  There was a total occlusion of the mid LAD with  collaterals from the RCA at the apex. There was also a faint end of thrombus distal or around the origin of the large septal perforator.  There was moderate diffuse disease of the RCA and a large OM branch.  Medical therapy was recommended.  -Echo showed LVEF of 45-50% with akinesis of the apical septal wall, anterior wall, mid anteroseptal wall, and apical segment as well as hypokinesis of the mid anterior wall. - medical therapy: started DAPT with Aspirin and Plavix with plans to continue this for at least 6 months. He was continued on home Losartan 25mg  daily and started on Toprol-XL 12.5mg  daily, Imdur 15mg  daily, and Spironolactone 12.5mg  daily. Continue home Lipitor 40mg  daily. New script sent  - Will need repeat BMET at follow-up visit. Consider switch losartan to Entresto and add SGLT2I at follow up appointment  - Follow up appointment arranged on 12/10/21    Non-Sustained VT - Multiple runs of NSVT noted on telemetry with longest run being 19 beats. He is asymptomatic with this. Heart rates in the 50s to 60s at rest but easily and frequently increase to the 70s to 90s. Potassium and magnesium were within normal limits. He was started on a low dose of Toprol-XL 12.5mg  daily.   Hypertension - Continue Losartan, Toprol-XL, Imdur, and Spironolactone as above. BP tolerating well.    Hyperlipidemia - Lipid panel this admission: Total Cholesterol 99, Triglycerides 41, HDL 50, LDL 41. At LDL goal of <70 given CAD. Continue home Lipitor 40mg  daily.     Did the patient have an acute coronary syndrome (MI, NSTEMI, STEMI, etc) this admission?:  Yes                               AHA/ACC Clinical Performance & Quality Measures: Aspirin prescribed? - Yes ADP Receptor Inhibitor (Plavix/Clopidogrel, Brilinta/Ticagrelor or Effient/Prasugrel) prescribed (includes medically managed patients)? - Yes Beta Blocker prescribed? - Yes High Intensity Statin (Lipitor 40-80mg  or Crestor 20-40mg ) prescribed? -  Yes EF assessed during THIS hospitalization? - Yes For EF <40%, was ACEI/ARB prescribed? - Yes For EF <40%, Aldosterone Antagonist (Spironolactone or Eplerenone) prescribed? - Yes Cardiac Rehab Phase II ordered (including medically managed patients)? - Yes   _____________  Discharge Vitals Blood pressure 138/89, pulse 64, temperature 98 F (36.7 C), temperature source Oral, resp. rate 16, height 6\' 2"  (1.88 m), weight 90.7 kg, SpO2 98 %.  Filed Weights   12/04/21 0327  Weight: 90.7 kg    Labs & Radiologic Studies    CBC Recent Labs    12/04/21 1614 12/05/21 0317  WBC 10.8* 12.5*  HGB 12.1* 14.2  HCT 34.0* 39.9  MCV 92.9 93.0  PLT 138* [id number]   Basic Metabolic Panel Recent Labs    12/05/21 0317 12/06/21 0241 12/06/21 0732  NA 136 137 135  K 4.0 4.0 3.9  CL 109 105 105  CO2 21* 23 24  GLUCOSE 129* 108* 134*  BUN 7* 9 11  CREATININE 0.84 1.01 1.14  CALCIUM 8.4* 8.8* 8.7*  MG 1.9  --   --    Liver Function Tests Recent Labs    12/04/21 0340  AST 38  ALT 28  ALKPHOS 84  BILITOT 1.0  PROT 7.6  ALBUMIN 4.1   No results for input(s): "LIPASE", "AMYLASE" in the last 72 hours. High Sensitivity Troponin:   Recent Labs  Lab 12/04/21 0340 12/04/21 0619 12/04/21 0859  TROPONINIHS 53* 573* 2,143*    BNP Invalid input(s): "POCBNP" D-Dimer Recent Labs    12/04/21 0340  DDIMER 0.38   Hemoglobin A1C Recent Labs    12/05/21 0317  HGBA1C 5.5   Fasting Lipid Panel Recent Labs    12/05/21 0317  CHOL 99  HDL 50  LDLCALC 41  TRIG 41  CHOLHDL 2.0   Thyroid Function Tests No results for input(s): "TSH", "T4TOTAL", "T3FREE", "THYROIDAB" in the last 72 hours.  Invalid input(s): "FREET3" _____________  ECHOCARDIOGRAM COMPLETE  Result Date: 12/05/2021    ECHOCARDIOGRAM REPORT   Patient Name:   KEIDRICK PLACK Date of Exam: 12/05/2021 Medical Rec #:  [id number]    Height:       74.0 in Accession #:    [id number]   Weight:       200.0 lb Date of Birth:   07/31/1955    BSA:          2.173 m Patient Age:    89 years     BP:           128/83 mmHg Patient Gender: M            HR:           67 bpm. Exam Location:  Inpatient Procedure: 2D Echo Indications:    NSTEMI  History:        Patient has no prior history of Echocardiogram examinations.                 CAD.  Sonographer:    Johny Chess RDCS Referring Phys: [place]  1. False tendon in the apical LV of no clinical significance. Left ventricular ejection fraction, by estimation, is 45 to 50%. The left ventricle has mildly decreased function. The left ventricle demonstrates regional wall motion abnormalities (see scoring diagram/findings for description). Left ventricular diastolic parameters are indeterminate. There is akinesis of the left ventricular, apical septal wall and anterior wall. There is hypokinesis of the left ventricular, mid anterior wall. There is  akinesis of the left ventricular, apical segment. There is akinesis of the left ventricular, mid anteroseptal wall.  2. Right ventricular systolic function is normal. The right ventricular size is normal. A Prominent moderator band is visualized. There is normal pulmonary artery systolic pressure. The estimated right ventricular systolic pressure is [id number] mmHg.  3. The mitral valve is normal in structure. Trivial mitral valve regurgitation. No evidence of mitral stenosis.  4. The aortic valve is normal in structure. Aortic valve regurgitation is not visualized. No aortic stenosis is present.  5. The inferior vena cava is dilated in size with >50% respiratory variability, suggesting right atrial pressure of 8 mmHg. FINDINGS  Left Ventricle: False tendon in the apical LV of no clinical significance. Left ventricular ejection fraction, by estimation, is 45 to 50%. The left ventricle has mildly decreased function. The left ventricle demonstrates regional wall motion abnormalities. The left ventricular internal cavity size was normal in  size. There is no left ventricular hypertrophy. Left ventricular diastolic parameters are indeterminate. Normal left ventricular filling pressure. Right Ventricle: The right ventricular size is normal. No increase in right ventricular wall thickness. Right ventricular systolic function is normal. There is normal pulmonary artery systolic pressure. The  tricuspid regurgitant velocity is 2.52 m/s, and  with an assumed right atrial pressure of 8 mmHg, the estimated right ventricular systolic pressure is [id number] mmHg. Left Atrium: Left atrial size was normal in size. Right Atrium: Right atrial size was normal in size. Pericardium: There is no evidence of pericardial effusion. Mitral Valve: The mitral valve is normal in structure. Trivial mitral valve regurgitation. No evidence of mitral valve stenosis. Tricuspid Valve: The tricuspid valve is normal in structure. Tricuspid valve regurgitation is trivial. No evidence of tricuspid stenosis. Aortic Valve: The aortic valve is normal in structure. Aortic valve regurgitation is not visualized. No aortic stenosis is present. Pulmonic Valve: The pulmonic valve was normal in structure. Pulmonic valve regurgitation is not visualized. No evidence of pulmonic stenosis. Aorta: The aortic root is normal in size and structure. Venous: The inferior vena cava is dilated in size with greater than 50% respiratory variability, suggesting right atrial pressure of 8 mmHg. IAS/Shunts: No atrial level shunt detected by color flow Doppler. Additional Comments: A Prominent moderator band is visualized.  LEFT VENTRICLE PLAX 2D LVIDd:         5.30 cm     Diastology LVIDs:         4.00 cm     LV e' medial:    6.53 cm/s LV PW:         1.00 cm     LV E/e' medial:  11.5 LV IVS:        1.00 cm     LV e' lateral:   8.27 cm/s LVOT diam:     2.20 cm     LV E/e' lateral: 9.1 LV SV:         57 LV SV Index:   26 LVOT Area:     3.80 cm  LV Volumes (MOD) LV vol d, MOD A4C: 96.4 ml LV vol s, MOD A4C: 61.0 ml LV SV  MOD A4C:     96.4 ml RIGHT VENTRICLE             IVC RV S prime:     12.00 cm/s  IVC diam: 2.50 cm TAPSE (M-mode): 1.9 cm LEFT ATRIUM             Index        RIGHT ATRIUM           Index LA diam:        3.40 cm 1.56 cm/m   RA Area:     17.10 cm LA Vol (A2C):   60.8 ml 27.97 ml/m  RA Volume:   44.20 ml  20.34 ml/m LA Vol (A4C):   50.9 ml 23.42 ml/m LA Biplane Vol: 61.5 ml 28.30 ml/m  AORTIC VALVE LVOT Vmax:   77.80 cm/s LVOT Vmean:  48.700 cm/s LVOT VTI:    0.150 m  AORTA Ao Root diam: 3.40 cm Ao Asc diam:  3.50 cm MITRAL VALVE               TRICUSPID VALVE MV Area (PHT): 2.87 cm    TR Peak grad:   25.4 mmHg MV Decel Time: 264 msec    TR Vmax:        252.00 cm/s MV E velocity: 75.10 cm/s MV A velocity: 53.40 cm/s  SHUNTS MV E/A ratio:  1.41        Systemic VTI:  0.15 m  Systemic Diam: 2.20 cm Armanda Magic MD Electronically signed by Armanda Magic MD Signature Date/Time: 12/05/2021/10:57:48 AM    Final    CARDIAC CATHETERIZATION  Result Date: 12/04/2021 CONCLUSIONS: On presentation to the Cath Lab, no chest discomfort since 6 AM this morning.  The left coronary anatomy is ambiguous because of multiple diagonals as ramus light branches.  But we can never find the stump.  There are faint hint of thrombus distal or around the origin of the large septal perforator.  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 Left main is widely patent Circumflex gives origin to 2 large obtuse marginal branches that arise from a common stent.  The more medial branch is larger and has moderate proximal disease up to 60%. Right coronary contains codominant anatomy and moderate mid vessel disease up to 60 to 70% in the mid vessel.  It supplies collaterals around the apex to the LAD. Left ventriculography demonstrates inferoapical and apical moderate hypokinesis.  EF 40 to 50%.  LVEDP upper normal at 17 mmHg. Recommend: Aspirin and Plavix for at least 6 months. Aggressive  lipid-lowering.  Nitroglycerin if recurrent chest pain.   DG Chest Portable 1 View  Result Date: 12/04/2021 CLINICAL DATA:  Chest pain.  Shortness of breath. EXAM: PORTABLE CHEST 1 VIEW COMPARISON:  07/15/2015 FINDINGS: Lower lung volumes from prior exam. There is patchy ill-defined opacity in the right infrahilar lung. Heart size upper normal with normal mediastinal contours. No pulmonary edema. No pleural effusion or pneumothorax. No acute osseous abnormalities are seen. IMPRESSION: Patchy ill-defined opacity in the right infrahilar lung, may be atelectasis or pneumonia. Electronically Signed   By: Narda Rutherford M.D.   On: 12/04/2021 03:59    Disposition   Patient was seen and examined by Dr Shari Prows and determined to be stable for discharge. Outpatient follow-up arranged. Medications as below.  Follow-up Plans & Appointments     Follow-up Information     Dyann Kief, PA-C Follow up.   Specialty: Cardiology Why: Hospital follow-up with Cardiology scheduled fo 12/10/2021 at 11:15am. Please arrive 15 minutes early for check-in. If this date/time does not work for you, please call our office to reschedule. Contact information: [address] [place] [address] [phone]                Discharge Instructions     Amb Referral to Cardiac Rehabilitation   Complete by: As directed    Diagnosis: NSTEMI   After initial evaluation and assessments completed: Virtual Based Care may be provided alone or in conjunction with Phase 2 Cardiac Rehab based on patient barriers.: Yes   Diet - low sodium heart healthy   Complete by: As directed    Discharge instructions   Complete by: As directed    Radial Site Care  Refer to this sheet in the next few weeks. These instructions provide you with information on caring for yourself after your procedure. Your caregiver may also give you more specific instructions. Your treatment has been planned according to current  medical practices, but problems sometimes occur. Call your caregiver if you have any problems or questions after your procedure.  HOME CARE INSTRUCTIONS You may shower the day after the procedure. Remove the bandage (dressing) and gently wash the site with plain soap and water. Gently pat the site dry.  Do not apply powder or lotion to the site.  Do not submerge the affected site in water for  3 to 5 days.  Inspect the site at least twice daily.  Do not flex or bend the affected arm for 24 hours.  No lifting over 5 pounds (2.3 kg) for 5 days after your procedure.  Do not drive home if you are discharged the same day of the procedure. Have someone else drive you.  You may drive 24 hours after the procedure unless otherwise instructed by your caregiver.   What to expect: Any bruising will usually fade within 1 to 2 weeks.  Blood that collects in the tissue (hematoma) may be painful to the touch. It should usually decrease in size and tenderness within 1 to 2 weeks.   SEEK IMMEDIATE MEDICAL CARE IF: You have unusual pain at the radial site.  You have redness, warmth, swelling, or pain at the radial site.  You have drainage (other than a small amount of blood on the dressing).  You have chills.  You have a fever or persistent symptoms for more than 72 hours.  You have a fever and your symptoms suddenly get worse.  Your arm becomes pale, cool, tingly, or numb.  You have heavy bleeding from the site. Hold pressure on the site.    PLEASE REMEMBER TO BRING ALL OF YOUR MEDICATIONS TO EACH OF YOUR FOLLOW-UP OFFICE VISITS.  PLEASE ATTEND ALL SCHEDULED FOLLOW-UP APPOINTMENTS.   Activity: Increase activity slowly as tolerated. You may shower, but no soaking baths (or swimming) for 1 week. No driving for 24 hours. No lifting over 5 lbs for 1 week. No sexual activity for 1 week.    Wound Care: You may wash cath site gently with soap and water. Keep cath site clean and dry. If you notice pain,  swelling, bleeding or pus at your cath site, please call [phone].   Also keep a log of you blood pressures and bring back to your follow up appt. Please call the office with any questions.   Patients taking blood thinners should generally stay away from medicines like ibuprofen, Advil, Motrin, naproxen, and Aleve due to risk of stomach bleeding. You may take Tylenol as directed or talk to your primary doctor about alternatives.   Increase activity slowly   Complete by: As directed        Discharge Medications   Allergies as of 12/06/2021       Reactions   Penicillins    REACTION UNKNOWN Has patient had a PCN reaction causing immediate rash, facial/tongue/throat swelling, SOB or lightheadedness with hypotension: UNKNOWN Has patient had a PCN reaction causing severe rash involving mucus membranes or skin necrosis: UNKNOWN Has patient had a PCN reaction that required hospitalization UNKNOWN Has patient had a PCN reaction occurring within the last 10 years: UNKNOWN If all of the above answers are "NO", then may proceed with Cephalosporin use.        Medication List     STOP taking these medications    cetirizine 10 MG tablet Commonly known as: ZyrTEC Allergy   cyclobenzaprine 5 MG tablet Commonly known as: FLEXERIL   ibuprofen 800 MG tablet Commonly known as: ADVIL   promethazine-dextromethorphan 6.25-15 MG/5ML syrup Commonly known as: PROMETHAZINE-DM       TAKE these medications    acetaminophen 650 MG CR tablet Commonly known as: TYLENOL Take 650 mg by mouth every 8 (eight) hours as needed for pain.   aspirin EC 81 MG tablet Take 1 tablet (81 mg total) by mouth daily. Swallow whole. Start taking on: December 07, 2021  atorvastatin 40 MG tablet Commonly known as: LIPITOR Take 40 mg by mouth daily.   clopidogrel 75 MG tablet Commonly known as: PLAVIX Take 1 tablet (75 mg total) by mouth daily with breakfast. Start taking on: December 07, 2021    isosorbide mononitrate 30 MG 24 hr tablet Commonly known as: IMDUR Take 0.5 tablets (15 mg total) by mouth daily. Start taking on: December 07, 2021   losartan 25 MG tablet Commonly known as: COZAAR Take 25 mg by mouth daily.   metoprolol succinate 25 MG 24 hr tablet Commonly known as: TOPROL-XL Take 0.5 tablets (12.5 mg total) by mouth daily. Start taking on: December 07, 2021   nitroGLYCERIN 0.4 MG SL tablet Commonly known as: NITROSTAT Place 1 tablet (0.4 mg total) under the tongue every 5 (five) minutes x 3 doses as needed for chest pain.   spironolactone 25 MG tablet Commonly known as: ALDACTONE Take 0.5 tablets (12.5 mg total) by mouth daily. Start taking on: December 07, 2021           Outstanding Labs/Studies   Repeat BMET at follow-up visit.  Duration of Discharge Encounter   Greater than 30 minutes including physician time.  Signed, Cyndi Bender, NP 12/06/2021, 11:30 AM  Patient seen and examined and agree with Cyndi Bender, NP as detailed above.   In brief, the patient is a 66 year old male with HTN and HLD who initially presented to [facility] with chest pain found to have NSTEMI. Transferred to [facility] for same day cath.    Cath on 12/04/21 with ambiguous of coronary anatomy with a multiple diagonals as ramus light branches.  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 There was also a faint end of thrombus distal or around the origin of the large septal perforator.  There was moderate diffuse disease of the RCA and a large OM branch. Medical therapy was recommended.   TTE 45-50% with akinesis of apex, apical anterior, mid-to-apical septal walls consistent with LAD territory.    Currently, the patient is doing well. Chest pain has resolved and has remained off nitro gtt. Will discharge home today with close CV follow-up   GEN: No acute distress.   Neck: No JVD Cardiac: RRR, no murmurs, rubs, or gallops.  Respiratory: CTAB GI: Soft,  nontender, non-distended  MS: No edema; No deformity. Right radial access site c/d/i Neuro:  Nonfocal  Psych: Normal affect    Plan: -Plan to discharge home today -Will continue medical therapy for NSTEMI as no targets for PCI -Continue ASA 81mg  daily, plavix 75mg  daily -Continue losartan 25mg  daily; can change to entresto as outpatient -Continue metop 12.5mg  daily -Continue imdur 15mg  daily -Continue spironolactone 12.5mg  daily -Can add SGLT2i as outpatient -Plan for repeat BMET as outpatient within 1-2 weeks -Cardiac rehab    , MD

## 2021-12-05 NOTE — Progress Notes (Signed)
  Echocardiogram 2D Echocardiogram has been performed.  Terry Mayo 12/05/2021, 9:57 AM

## 2021-12-05 NOTE — Plan of Care (Signed)
  Problem: Education: Goal: Understanding of CV disease, CV risk reduction, and recovery process will improve Outcome: Progressing Goal: Individualized Educational Video(s) Outcome: Progressing   Problem: Activity: Goal: Ability to return to baseline activity level will improve Outcome: Progressing   Problem: Cardiovascular: Goal: Ability to achieve and maintain adequate cardiovascular perfusion will improve Outcome: Progressing   Problem: Education: Goal: Understanding of cardiac disease, CV risk reduction, and recovery process will improve Outcome: Progressing Goal: Individualized Educational Video(s) Outcome: Progressing   Problem: Activity: Goal: Ability to tolerate increased activity will improve Outcome: Progressing   Problem: Cardiac: Goal: Ability to achieve and maintain adequate cardiovascular perfusion will improve Outcome: Progressing   Problem: Health Behavior/Discharge Planning: Goal: Ability to safely manage health-related needs after discharge will improve Outcome: Progressing   Problem: Education: Goal: Knowledge of General Education information will improve Description: Including pain rating scale, medication(s)/side effects and non-pharmacologic comfort measures Outcome: Progressing   Problem: Health Behavior/Discharge Planning: Goal: Ability to manage health-related needs will improve Outcome: Progressing   Problem: Clinical Measurements: Goal: Ability to maintain clinical measurements within normal limits will improve Outcome: Progressing Goal: Will remain free from infection Outcome: Progressing Goal: Diagnostic test results will improve Outcome: Progressing Goal: Respiratory complications will improve Outcome: Progressing Goal: Cardiovascular complication will be avoided Outcome: Progressing   Problem: Activity: Goal: Risk for activity intolerance will decrease Outcome: Progressing   Problem: Nutrition: Goal: Adequate nutrition will be  maintained Outcome: Progressing   Problem: Coping: Goal: Level of anxiety will decrease Outcome: Progressing   Problem: Elimination: Goal: Will not experience complications related to bowel motility Outcome: Progressing Goal: Will not experience complications related to urinary retention Outcome: Progressing   Problem: Pain Managment: Goal: General experience of comfort will improve Outcome: Progressing   Problem: Safety: Goal: Ability to remain free from injury will improve Outcome: Progressing   Problem: Skin Integrity: Goal: Risk for impaired skin integrity will decrease Outcome: Progressing

## 2021-12-05 NOTE — Progress Notes (Addendum)
Progress Note  Patient Name: ARINZECHUKWU MACQUARRIE Date of Encounter: 12/05/2021  [facility] Cardiologist: New (Dr. Carlyle Dolly)  Subjective   No significant overnight events. We discussed cath results. Patient denies any chest pain or left arm pain this morning. No shortness of breath. His only complaint this morning is intermittent numbness of his left arm which he states he had prior to admission. He is not sure if this is due to how he is laying. He has been having short runs of NSVT on telemetry (longest run 19 beats) but denies any palpitations.  Inpatient Medications    Scheduled Meds:  aspirin EC  81 mg Oral Daily   atorvastatin  80 mg Oral Daily   clopidogrel  75 mg Oral Q breakfast   heparin  5,000 Units Subcutaneous Q8H   losartan  25 mg Oral Daily   sodium chloride flush  3 mL Intravenous Q12H   Continuous Infusions:  sodium chloride     nitroGLYCERIN 30 mcg/min (12/05/21 0557)   PRN Meds: sodium chloride, acetaminophen, morphine injection, nitroGLYCERIN, ondansetron (ZOFRAN) IV, oxyCODONE, senna-docusate, sodium chloride flush   Vital Signs    Vitals:   12/05/21 0010 12/05/21 0110 12/05/21 0210 12/05/21 0407  BP: 113/69 119/86 136/84 128/83  Pulse: 91 66 62 60  Resp:    16  Temp:    98.4 F (36.9 C)  TempSrc:    Oral  SpO2: 98% 94% 96% 96%  Weight:      Height:        Intake/Output Summary (Last 24 hours) at 12/05/2021 0631 Last data filed at 12/04/2021 1051 Gross per 24 hour  Intake 158.07 ml  Output --  Net 158.07 ml      12/04/2021    3:27 AM 04/30/2018   12:05 PM 09/26/2015    7:55 AM  Last 3 Weights  Weight (lbs) 200 lb 200 lb 190 lb  Weight (kg) 90.719 kg 90.719 kg 86.183 kg      Telemetry    Sinus rhythm with heart rates in the 50s to 60s at rest but will easily increase to the 70s to 90s.  Multiple short runs of NSVT noted (longest run 19 beats). - Personally Reviewed  ECG    No new ECG tracing today. - Personally Reviewed  Physical  Exam   GEN: No acute distress.   Neck: No JVD. Cardiac: RRR. No murmurs, rubs, or gallops. Right radial cath site soft with no signs of hematoma. Respiratory: Clear to auscultation bilaterally. No wheezes, rhonchi, or rales. GI: Soft, non-distended, and non-tender.  MS: No lower extremity edema. No deformity. Skin: Warm and dry. Neuro:  No focal deficits. Psych: Normal affect. Responds appropriately.  Labs    High Sensitivity Troponin:   Recent Labs  Lab 12/04/21 0340 12/04/21 0619 12/04/21 0859  TROPONINIHS 53* 573* 2,143*     Chemistry Recent Labs  Lab 12/04/21 0340 12/04/21 1614 12/05/21 0317  NA 139  --  136  K 3.7  --  4.0  CL 108  --  109  CO2 23  --  21*  GLUCOSE 141*  --  129*  BUN 12  --  7*  CREATININE 1.02 0.76 0.84  CALCIUM 8.9  --  8.4*  PROT 7.6  --   --   ALBUMIN 4.1  --   --   AST 38  --   --   ALT 28  --   --   ALKPHOS 84  --   --  BILITOT 1.0  --   --   GFRNONAA >60 >60 >60  ANIONGAP 8  --  6    Lipids  Recent Labs  Lab 12/05/21 0317  CHOL 99  TRIG 41  HDL 50  LDLCALC 41  CHOLHDL 2.0    Hematology Recent Labs  Lab 12/04/21 0340 12/04/21 1614 12/05/21 0317  WBC 10.0 10.8* 12.5*  RBC 5.07 3.66* 4.29  HGB 16.4 12.1* 14.2  HCT 47.1 34.0* 39.9  MCV 92.9 92.9 93.0  MCH 32.3 33.1 33.1  MCHC 34.8 35.6 35.6  RDW 14.0 13.8 13.5  PLT 178 138* 151   Thyroid No results for input(s): "TSH", "FREET4" in the last 168 hours.  BNP Recent Labs  Lab 12/04/21 0340  BNP 48.0    DDimer  Recent Labs  Lab 12/04/21 0340  DDIMER 0.38     Radiology    CARDIAC CATHETERIZATION  Result Date: 12/04/2021 CONCLUSIONS: On presentation to the Cath Lab, no chest discomfort since 6 AM this morning.  The left coronary anatomy is ambiguous because of multiple diagonals as ramus light branches.  But we can never find the stump.  There are faint hint of thrombus distal or around the origin of the large septal perforator.  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 Left main is widely patent Circumflex gives origin to 2 large obtuse marginal branches that arise from a common stent.  The more medial branch is larger and has moderate proximal disease up to 60%. Right coronary contains codominant anatomy and moderate mid vessel disease up to 60 to 70% in the mid vessel.  It supplies collaterals around the apex to the LAD. Left ventriculography demonstrates inferoapical and apical moderate hypokinesis.  EF 40 to 50%.  LVEDP upper normal at 17 mmHg. Recommend: Aspirin and Plavix for at least 6 months. Aggressive lipid-lowering.  Nitroglycerin if recurrent chest pain.   DG Chest Portable 1 View  Result Date: 12/04/2021 CLINICAL DATA:  Chest pain.  Shortness of breath. EXAM: PORTABLE CHEST 1 VIEW COMPARISON:  07/15/2015 FINDINGS: Lower lung volumes from prior exam. There is patchy ill-defined opacity in the right infrahilar lung. Heart size upper normal with normal mediastinal contours. No pulmonary edema. No pleural effusion or pneumothorax. No acute osseous abnormalities are seen. IMPRESSION: Patchy ill-defined opacity in the right infrahilar lung, may be atelectasis or pneumonia. Electronically Signed   By: Narda Rutherford M.D.   On: 12/04/2021 03:59    Cardiac Studies   Left Cardiac Catheterization 12/04/2021: Conclusions: On presentation to the Cath Lab, no chest discomfort since 6 AM this morning.  The left coronary anatomy is ambiguous because of multiple diagonals as ramus light branches.  But we can never find the stump.  There are faint hint of thrombus distal or around the origin of the large septal perforator.  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 Left main is widely patent Circumflex gives origin to 2 large obtuse marginal branches that arise from a common stent.  The more medial branch is larger and has moderate proximal disease up to 60%. Right coronary contains  codominant anatomy and moderate mid vessel disease up to 60 to 70% in the mid vessel.  It supplies collaterals around the apex to the LAD. Left ventriculography demonstrates inferoapical and apical moderate hypokinesis.  EF 40 to 50%.  LVEDP upper normal at 17 mmHg.   Recommend: Aspirin and Plavix for  at least 6 months. Aggressive lipid-lowering.  Nitroglycerin if recurrent chest pain.  Diagnostic Dominance: Co-dominant   Patient Profile     66 y.o. male with a history of hypertension and hyperlipidemia but no known cardiac disease prior to admission.  He presented to the [facility] ED on 12/04/2021 for evaluation of chest pain with radiation to left arm and ruled in for NSTEMI and was ultimately transferred to same-day to [facility] for further evaluation and management including cardiac catheterization.  Assessment & Plan    NSTEMI Presented with chest tightness that radiated down his left arm cessation nausea. EKG showed sinus bradycardia with ST elevation along the precordial leads that improved on repeat tracing.  High-sensitivity troponin elevated at 53 >> 573 >> 2,143.  LHC showed ambiguous of coronary anatomy with a multiple diagonals as ramus light branches.  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 There was also a faint end of thrombus distal or around the origin of the large septal perforator.  There was moderate diffuse disease of the RCA and a large OM branch.  Medical therapy was recommended. -No recurrent chest or arm pain. He does report some intermittent numbness of his left arm which he said he had prior to admission. He is unsure if it is related to how he was laying. -Echo pending. -Continue DAPT with aspirin and Plavix for at least 6 months. -Will start Toprol-XL 12.5mg  daily. Continue Losartan 25 mg daily. -Continue Lipitor 80 mg daily.  Non-Sustained VT Multiple runs of NSVT noted on telemetry with longest run  being 19 beats. He is asymptomatic with this. Heart rates in the 50s to 60s at rest but easily and frequently increase to the 70s to 90s.  -Potassium 4.0 and Magnesium 1.9. -I think rates will tolerate a low dose beta-blocker. Will start Toprol-XL 12.5mg  daily.  Hypertension BP mildly elevated at times. -Continue Losartan 25mg  daily. -Will start Toprol-XL 12.5mg  daily as above.  Hyperlipidemia Lipid panel this admission: Total Cholesterol 99, Triglycerides 41, HDL 50, LDL 41. At LDL goal of <70 given CAD. -Continue Lipitor 80mg  daily.  Disposition: He has not been up out of bed much yet. Plan is for Echo today and to have him seen by Cardiac Rehab. If Echo looks OK and he does well ambulating with Cardiac Rehab, he may be able to be discharged later today. MD to follow.  For questions or updates, please contact [place] Please consult [email or web address] for contact info under        Signed, Darreld Mclean, PA-C  12/05/2021, 6:31 AM    Patient seen and examined and agree with Sande Rives, PA-C as detailed above.  In brief, the patient is a 66 year old male with HTN and HLD who initially presented to [facility] with chest pain found to have NSTEMI. Transferred to [facility] for same day cath.   Cath on 12/04/21 with ambiguous of coronary anatomy with a multiple diagonals as ramus light branches.  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 There was also a faint end of thrombus distal or around the origin of the large septal perforator.  There was moderate diffuse disease of the RCA and a large OM branch. Medical therapy was recommended.  TTE 45-50% with akinesis of apex, apical anterior, mid-to-apical septal walls consistent with LAD territory.   Currently, the patient is doing well. Chest pain has  resolved but remains on nitro gtt. Will wean off nitro and re-evaluate. Plan to add GDMT as tolerated. If remains CP free after stopping nitro, can  discharge later today.   GEN: No acute distress.   Neck: No JVD Cardiac: RRR, no murmurs, rubs, or gallops.  Respiratory: Clear to auscultation bilaterally. GI: Soft, nontender, non-distended  MS: No edema; No deformity. Right radial access site c/d/i Neuro:  Nonfocal  Psych: Normal affect   Plan: -Will continue medical therapy for NSTEMI as no targets for PCI -Stop nitro gtt and change to imdur 15mg  daily -Continue ASA 81mg  daily, plavix 75mg  daily -Continue losartan 25mg  daily; can change to entresto as outpatient -Continue metop 12.5mg  daily -Start spironolactone 12.5mg  daily -Can add SGLT2i as outpatient -Plan for repeat BMET as outpatient within 1-2 weeks -Cardiac rehab consulted -Will reassess after stopping nitro gtt and if chest pain free, can discharge home today  , MD

## 2021-12-05 NOTE — Discharge Instructions (Signed)
Post NSTEMI: °NO HEAVY LIFTING X 2 WEEKS. °NO SEXUAL ACTIVITY X 2 WEEKS. °NO DRIVING X 1 WEEK. °NO SOAKING BATHS, HOT TUBS, POOLS, ETC., X 7 DAYS. ° °Radial Site Care: °Refer to this sheet in the next few weeks. These instructions provide you with information on caring for yourself after your procedure. Your caregiver may also give you more specific instructions. Your treatment has been planned according to current medical practices, but problems sometimes occur. Call your caregiver if you have any problems or questions after your procedure. °HOME CARE INSTRUCTIONS °· You may shower the day after the procedure. Remove the bandage (dressing) and gently wash the site with plain soap and water. Gently pat the site dry.  °· Do not apply powder or lotion to the site.  °· Do not submerge the affected site in water for 3 to 5 days.  °· Inspect the site at least twice daily.  °· Do not flex or bend the affected arm for 24 hours.  °· No lifting over 5 pounds (2.3 kg) for 5 days after your procedure.  °· Do not drive home if you are discharged the same day of the procedure. Have someone else drive you.  °What to expect: °· Any bruising will usually fade within 1 to 2 weeks.  °· Blood that collects in the tissue (hematoma) may be painful to the touch. It should usually decrease in size and tenderness within 1 to 2 weeks.  °SEEK IMMEDIATE MEDICAL CARE IF: °· You have unusual pain at the radial site.  °· You have redness, warmth, swelling, or pain at the radial site.  °· You have drainage (other than a small amount of blood on the dressing).  °· You have chills.  °· You have a fever or persistent symptoms for more than 72 hours.  °· You have a fever and your symptoms suddenly get worse.  °· Your arm becomes pale, cool, tingly, or numb.  °· You have heavy bleeding from the site. Hold pressure on the site.  ° ° °

## 2021-12-05 NOTE — Plan of Care (Signed)
  Problem: Education: Goal: Understanding of CV disease, CV risk reduction, and recovery process will improve Outcome: Progressing Goal: Individualized Educational Video(s) Outcome: Progressing   Problem: Activity: Goal: Ability to return to baseline activity level will improve Outcome: Progressing   Problem: Cardiovascular: Goal: Ability to achieve and maintain adequate cardiovascular perfusion will improve Outcome: Progressing Goal: Vascular access site(s) Level 0-1 will be maintained Outcome: Completed/Met   Problem: Health Behavior/Discharge Planning: Goal: Ability to safely manage health-related needs after discharge will improve Outcome: Completed/Met

## 2021-12-05 NOTE — Progress Notes (Signed)
Mobility Specialist Progress Note    12/05/21 1523  Mobility  Activity Ambulated independently in hallway  Level of Assistance Independent  Assistive Device None  Distance Ambulated (ft) 450 ft  Activity Response Tolerated well  $Mobility charge 1 Mobility   Pre-Mobility: 72 HR During Mobility: 106 HR Post-Mobility: 82 HR  Pt received in bed and agreeable. No complaints on walk. Returned to bed with call bell in reach.    [place] Mobility Specialist

## 2021-12-05 NOTE — Progress Notes (Signed)
Pt had 16 beats run of VTach. Pt is asymptomatic. Dr. Evette Doffing was notified. Will continue to monitor pt.

## 2021-12-05 NOTE — Progress Notes (Addendum)
   Patient has been weaned off Nitro drip. He was able to ambulate without any recurrent chest pain; however, he states that he just does not feel good. He have a mild headache. He states he would prefer to stay another night. Discussed with Dr. Shari Prows - he was started on several new medications today so we will keep him one more night with plans to discharge in the morning. Advised patient to let RN know if headache worsens. He has PRN Tylenol if needed.  Corrin Parker, PA-C 12/05/2021 3:42 PM

## 2021-12-05 NOTE — Plan of Care (Signed)
  Problem: Education: Goal: Understanding of CV disease, CV risk reduction, and recovery process will improve Outcome: Adequate for Discharge   Problem: Activity: Goal: Ability to return to baseline activity level will improve Outcome: Adequate for Discharge   Problem: Cardiovascular: Goal: Ability to achieve and maintain adequate cardiovascular perfusion will improve Outcome: Adequate for Discharge

## 2021-12-05 NOTE — Progress Notes (Signed)
CARDIAC REHAB PHASE I   PRE:  Rate/Rhythm: 73/ Sr  BP:  Sitting: 126/83     SaO2: 98, RA  MODE:  Ambulation: 420 ft   POST:  Rate/Rhythm: 73/ SR  BP:  Sitting: 140/90     SaO2: 98, RA  Pt tolerated exercise well with no s/s and pushed his own IV pole with standby assist. Pt educated on MI booklet, site care, exercise guidelines, angina/ NTG, nutrition, ASA and plavix, and CRP2 referral to [facility]. Pt prefers to have wife in room for education. Denies further questions or concerns, pt left in bed with call button in reach.  [phone]   Jonna Coup, MS 12/05/2021 11:43 AM

## 2021-12-06 DIAGNOSIS — E782 Mixed hyperlipidemia: Secondary | ICD-10-CM | POA: Diagnosis not present

## 2021-12-06 DIAGNOSIS — I214 Non-ST elevation (NSTEMI) myocardial infarction: Secondary | ICD-10-CM | POA: Diagnosis not present

## 2021-12-06 DIAGNOSIS — I25110 Atherosclerotic heart disease of native coronary artery with unstable angina pectoris: Secondary | ICD-10-CM | POA: Diagnosis not present

## 2021-12-06 DIAGNOSIS — I10 Essential (primary) hypertension: Secondary | ICD-10-CM | POA: Diagnosis not present

## 2021-12-06 LAB — BASIC METABOLIC PANEL
Anion gap: 9 (ref 5–15)
Anion gap: 6 (ref 5–15)
BUN: 9 mg/dL (ref 8–23)
BUN: 11 mg/dL (ref 8–23)
CO2: 23 mmol/L (ref 22–32)
CO2: 24 mmol/L (ref 22–32)
Calcium: 8.8 mg/dL — ABNORMAL LOW (ref 8.9–10.3)
Calcium: 8.7 mg/dL — ABNORMAL LOW (ref 8.9–10.3)
Chloride: 105 mmol/L (ref 98–111)
Chloride: 105 mmol/L (ref 98–111)
Creatinine, Ser: 1.01 mg/dL (ref 0.61–1.24)
Creatinine, Ser: 1.14 mg/dL (ref 0.61–1.24)
GFR, Estimated: >60 mL/min (ref >60)
GFR, Estimated: >60 mL/min (ref >60)
Glucose, Bld: 108 mg/dL — ABNORMAL HIGH (ref 70–99)
Glucose, Bld: 134 mg/dL — ABNORMAL HIGH (ref 70–99)
Potassium: 4 mmol/L (ref 3.5–5.1)
Potassium: 3.9 mmol/L (ref 3.5–5.1)
Sodium: 137 mmol/L (ref 135–145)
Sodium: 135 mmol/L (ref 135–145)

## 2021-12-06 LAB — LIPOPROTEIN A (LPA): Lipoprotein (a): 71.9 nmol/L — ABNORMAL HIGH (ref <75.0)

## 2021-12-06 MED ORDER — CLOPIDOGREL BISULFATE 75 MG PO TABS: 75.0000 mg | ORAL_TABLET | Freq: Every day | ORAL | 3 refills | Status: DC

## 2021-12-06 MED ORDER — METOPROLOL SUCCINATE ER 25 MG PO TB24: 12.5000 mg | ORAL_TABLET | Freq: Every day | ORAL | 3 refills | Status: DC

## 2021-12-06 MED ORDER — NITROGLYCERIN 0.4 MG SL SUBL
0.4000 mg | SUBLINGUAL_TABLET | SUBLINGUAL | 1 refills | Status: DC | PRN
Start: 2021-12-06 — End: 2022-01-01

## 2021-12-06 MED ORDER — ATORVASTATIN CALCIUM 40 MG PO TABS
40.0000 mg | ORAL_TABLET | Freq: Every day | ORAL | Status: DC
  Administered 2021-12-06: 40 mg via ORAL
  Filled 2021-12-06: qty 1

## 2021-12-06 MED ORDER — ISOSORBIDE MONONITRATE ER 30 MG PO TB24: 15.0000 mg | ORAL_TABLET | Freq: Every day | ORAL | 3 refills | Status: DC

## 2021-12-06 MED ORDER — SPIRONOLACTONE 25 MG PO TABS: 12.5000 mg | ORAL_TABLET | Freq: Every day | ORAL | 3 refills | Status: DC

## 2021-12-06 MED ORDER — ASPIRIN 81 MG PO TBEC: 81.0000 mg | DELAYED_RELEASE_TABLET | Freq: Every day | ORAL | 3 refills | Status: AC

## 2021-12-06 NOTE — Progress Notes (Signed)
CARDIAC REHAB PHASE I   PRE:  Rate/Rhythm: 67 SR    BP: sitting 138/85    SaO2:   MODE:  Ambulation: 420 ft   POST:  Rate/Rhythm: 85 SR    BP: sitting 150/83     SaO2:   Tolerated well, no c/o. Feels well but still nervous re going home. Reviewed MI, exercise, and NTG for angina. Pt got wife on phone as he sts he doesn't always remember and we discussed again.  Pt sts he smokes THC, discussed using edibles instead of smoking.  [phone]  Ethelda Chick BS, ACSM-CEP 12/06/2021 9:21 AM

## 2021-12-08 NOTE — Progress Notes (Deleted)
Cardiology Office Note    Date:  12/08/2021   ID:  Clancy, Mullarkey [birth year], MRN [id number]   PCP:  [facility]   [facility]  Cardiologist:  None *** Advanced Practice Provider:  No care team member to display Electrophysiologist:  None   [id number]}   No chief complaint on file.   History of Present Illness:  Terry Mayo is a 66 y.o. male with history of HTN, HLD, CAD admitted with NSTEMI 12/04/2021 cardiac cath showed total mid LAD with collaterals from the RCA at the apex and faint end of thrombus distal around the origin of the large septal perforator.  Moderate diffuse disease in the RCA and large OM branch.  Medical therapy recommended.  Echo showed LVEF 45 to 50% with akinesis of the apical septal wall anterior wall and mid anterior septal wall and apical segment and hypokinesis of the mid anterior wall.  Plan was for aspirin and Plavix for at least 6 months.  Consider switching losartan to Entresto and adding SGLT2 I at follow-up appointment.  Patient also had multiple runs of NSVT longest 19 beats asymptomatic heart rates in the 50s to 60s started on low-dose Toprol-XL 12.5 mg daily    Past Medical History:  Diagnosis Date   Arthritis    Essential hypertension    Hyperlipidemia    Kidney stones     Past Surgical History:  Procedure Laterality Date   CHOLECYSTECTOMY  1990s   COLONOSCOPY WITH PROPOFOL N/A 09/30/2015   Procedure: COLONOSCOPY WITH PROPOFOL;  Surgeon: Corbin Ade, MD;  Location: [facility];  Service: Endoscopy;  Laterality: N/A;  930   KIDNEY STONE SURGERY  2005   LEFT HEART CATH AND CORONARY ANGIOGRAPHY N/A 12/04/2021   Procedure: LEFT HEART CATH AND CORONARY ANGIOGRAPHY;  Surgeon: Lyn Records, MD;  Location: [facility] INVASIVE CV LAB;  Service: Cardiovascular;  Laterality: N/A;    Current Medications: No outpatient medications have been marked as taking for the 12/10/21 encounter (Appointment) with  Dyann Kief, PA-C.     Allergies:   Penicillins   Social History   Socioeconomic History   Marital status: Single    Spouse name: Not on file   Number of children: Not on file   Years of education: Not on file   Highest education level: Not on file  Occupational History   Not on file  Tobacco Use   Smoking status: Former    Types: Cigarettes    Quit date: 08/05/2015    Years since quitting: 6.3   Smokeless tobacco: Never  Vaping Use   Vaping Use: Never used  Substance and Sexual Activity   Alcohol use: Not Currently    Alcohol/week: 0.0 standard drinks of alcohol    Comment: No ETOH in a month. Variable, up to daily, as much as much as a fifth of liquor per sitting.   Drug use: No    Frequency: 7.0 times per week    Comment: As often as daily when he does smoke marijuana   Sexual activity: Yes    Birth control/protection: None  Other Topics Concern   Not on file  Social History Narrative   Not on file   Social Determinants of Health   Financial Resource Strain: Not on file  Food Insecurity: Not on file  Transportation Needs: Not on file  Physical Activity: Not on file  Stress: Not on file  Social Connections: Not on  file     Family History:  The patient's ***family history includes Coronary artery disease in his sister.   ROS:   Please see the history of present illness.    ROS All other systems reviewed and are negative.   PHYSICAL EXAM:   VS:  There were no vitals taken for this visit.  Physical Exam  GEN: Well nourished, well developed, in no acute distress  HEENT: normal  Neck: no JVD, carotid bruits, or masses Cardiac:RRR; no murmurs, rubs, or gallops  Respiratory:  clear to auscultation bilaterally, normal work of breathing GI: soft, nontender, nondistended, + BS Ext: without cyanosis, clubbing, or edema, Good distal pulses bilaterally MS: no deformity or atrophy  Skin: warm and dry, no rash Neuro:  Alert and Oriented x 3, Strength and  sensation are intact Psych: euthymic mood, full affect  Wt Readings from Last 3 Encounters:  12/04/21 200 lb (90.7 kg)  04/30/18 200 lb (90.7 kg)  09/26/15 190 lb (86.2 kg)      Studies/Labs Reviewed:   EKG:  EKG is*** ordered today.  The ekg ordered today demonstrates ***  Recent Labs: 12/04/2021: ALT 28; B Natriuretic Peptide 48.0 12/05/2021: Hemoglobin 14.2; Magnesium 1.9; Platelets 151 12/06/2021: BUN 11; Creatinine, Ser 1.14; Potassium 3.9; Sodium 135   Lipid Panel    Component Value Date/Time   CHOL 99 12/05/2021 0317   TRIG 41 12/05/2021 0317   HDL 50 12/05/2021 0317   CHOLHDL 2.0 12/05/2021 0317   VLDL 8 12/05/2021 0317   LDLCALC 41 12/05/2021 0317    Additional studies/ records that were reviewed today include:  Left Cardiac Catheterization 12/04/2021: Conclusions: On presentation to the Cath Lab, no chest discomfort since 6 AM this morning.  The left coronary anatomy is ambiguous because of multiple diagonals as ramus light branches.  But we can never find the stump.  There are faint hint of thrombus distal or around the origin of the large septal perforator.  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 Left main is widely patent Circumflex gives origin to 2 large obtuse marginal branches that arise from a common stent.  The more medial branch is larger and has moderate proximal disease up to 60%. Right coronary contains codominant anatomy and moderate mid vessel disease up to 60 to 70% in the mid vessel.  It supplies collaterals around the apex to the LAD. Left ventriculography demonstrates inferoapical and apical moderate hypokinesis.  EF 40 to 50%.  LVEDP upper normal at 17 mmHg.   Recommend: Aspirin and Plavix for at least 6 months. Aggressive lipid-lowering.  Nitroglycerin if recurrent chest pain.   Diagnostic Dominance: Co-dominant    _____________   Echocardiogram 12/04/2021:    1. False tendon in the apical LV of no  clinical significance. Left  ventricular ejection fraction, by estimation, is 45 to 50%. The left  ventricle has mildly decreased function. The left ventricle demonstrates  regional wall motion abnormalities (see  scoring diagram/findings for description). Left ventricular diastolic  parameters are indeterminate. There is akinesis of the left ventricular,  apical septal wall and anterior wall. There is hypokinesis of the left  ventricular, mid anterior wall. There is   akinesis of the left ventricular, apical segment. There is akinesis of  the left ventricular, mid anteroseptal wall.   2. Right ventricular systolic function is normal. The right ventricular  size is normal. A Prominent moderator band is visualized. There is normal  pulmonary artery systolic  pressure. The estimated right ventricular  systolic pressure is 33.4 mmHg.   3. The mitral valve is normal in structure. Trivial mitral valve  regurgitation. No evidence of mitral stenosis.   4. The aortic valve is normal in structure. Aortic valve regurgitation is  not visualized. No aortic stenosis is present.   5. The inferior vena cava is dilated in size with >50% respiratory  variability, suggesting right atrial pressure of 8 mmHg.        Risk Assessment/Calculations:   {Does this patient have ATRIAL FIBRILLATION?:[id number]}     ASSESSMENT:    No diagnosis found.   PLAN:  In order of problems listed above:  CAD status post NSTEMI 12/04/2021 cardiac cath with total mid LAD with collaterals from the RCA moderate disease in the RCA large OM medical management recommended  Ischemic cardiomyopathy EF 45 to 50% on echo consider switching losartan to Entresto  NSVT on low-dose Toprol  Hypertension  Hyperlipidemia on Lipitor LDL 41  Shared Decision Making/Informed Consent   {Are you ordering a CV Procedure (e.g. stress test, cath, DCCV, TEE, etc)?   Press F2        :[id number]}    Medication Adjustments/Labs and  Tests Ordered: Current medicines are reviewed at length with the patient today.  Concerns regarding medicines are outlined above.  Medication changes, Labs and Tests ordered today are listed in the Patient Instructions below. There are no Patient Instructions on file for this visit.   Elson Clan, PA-C  12/08/2021 11:31 AM    [facility] [address] [place], [place], [place]  [address] Phone: [phone]; Fax: [phone]

## 2021-12-09 ENCOUNTER — Other Ambulatory Visit: Payer: Self-pay

## 2021-12-09 ENCOUNTER — Emergency Department (HOSPITAL_COMMUNITY): Payer: Medicare HMO

## 2021-12-09 ENCOUNTER — Encounter (HOSPITAL_COMMUNITY): Payer: Self-pay

## 2021-12-09 ENCOUNTER — Inpatient Hospital Stay (HOSPITAL_COMMUNITY)
Admission: EM | Admit: 2021-12-09 | Discharge: 2021-12-16 | DRG: 023 | Disposition: A | Discharge status: Skilled Nursing Facility | Payer: Medicare HMO | Attending: Neurology | Admitting: Neurology

## 2021-12-09 ENCOUNTER — Telehealth: Payer: Self-pay | Admitting: *Deleted

## 2021-12-09 DIAGNOSIS — R7401 Elevation of levels of liver transaminase levels: Secondary | ICD-10-CM | POA: Diagnosis present

## 2021-12-09 DIAGNOSIS — R4701 Aphasia: Secondary | ICD-10-CM | POA: Diagnosis present

## 2021-12-09 DIAGNOSIS — I110 Hypertensive heart disease with heart failure: Secondary | ICD-10-CM | POA: Diagnosis present

## 2021-12-09 DIAGNOSIS — Z7902 Long term (current) use of antithrombotics/antiplatelets: Secondary | ICD-10-CM

## 2021-12-09 DIAGNOSIS — H510 Palsy (spasm) of conjugate gaze: Secondary | ICD-10-CM | POA: Diagnosis present

## 2021-12-09 DIAGNOSIS — D72829 Elevated white blood cell count, unspecified: Secondary | ICD-10-CM | POA: Diagnosis present

## 2021-12-09 DIAGNOSIS — R29810 Facial weakness: Secondary | ICD-10-CM | POA: Diagnosis present

## 2021-12-09 DIAGNOSIS — I5022 Chronic systolic (congestive) heart failure: Secondary | ICD-10-CM | POA: Diagnosis present

## 2021-12-09 DIAGNOSIS — R29710 NIHSS score 10: Secondary | ICD-10-CM | POA: Diagnosis not present

## 2021-12-09 DIAGNOSIS — R29711 NIHSS score 11: Secondary | ICD-10-CM | POA: Diagnosis not present

## 2021-12-09 DIAGNOSIS — R471 Dysarthria and anarthria: Secondary | ICD-10-CM | POA: Diagnosis present

## 2021-12-09 DIAGNOSIS — I63232 Cerebral infarction due to unspecified occlusion or stenosis of left carotid arteries: Secondary | ICD-10-CM

## 2021-12-09 DIAGNOSIS — I255 Ischemic cardiomyopathy: Secondary | ICD-10-CM | POA: Diagnosis present

## 2021-12-09 DIAGNOSIS — Z79899 Other long term (current) drug therapy: Secondary | ICD-10-CM

## 2021-12-09 DIAGNOSIS — Z8673 Personal history of transient ischemic attack (TIA), and cerebral infarction without residual deficits: Secondary | ICD-10-CM

## 2021-12-09 DIAGNOSIS — E785 Hyperlipidemia, unspecified: Secondary | ICD-10-CM | POA: Diagnosis present

## 2021-12-09 DIAGNOSIS — F1210 Cannabis abuse, uncomplicated: Secondary | ICD-10-CM | POA: Diagnosis present

## 2021-12-09 DIAGNOSIS — I214 Non-ST elevation (NSTEMI) myocardial infarction: Secondary | ICD-10-CM | POA: Diagnosis present

## 2021-12-09 DIAGNOSIS — Z87891 Personal history of nicotine dependence: Secondary | ICD-10-CM

## 2021-12-09 DIAGNOSIS — R29715 NIHSS score 15: Secondary | ICD-10-CM | POA: Diagnosis not present

## 2021-12-09 DIAGNOSIS — I6522 Occlusion and stenosis of left carotid artery: Secondary | ICD-10-CM | POA: Diagnosis present

## 2021-12-09 DIAGNOSIS — M1990 Unspecified osteoarthritis, unspecified site: Secondary | ICD-10-CM | POA: Diagnosis present

## 2021-12-09 DIAGNOSIS — Z87442 Personal history of urinary calculi: Secondary | ICD-10-CM

## 2021-12-09 DIAGNOSIS — Z751 Person awaiting admission to adequate facility elsewhere: Secondary | ICD-10-CM

## 2021-12-09 DIAGNOSIS — Z20822 Contact with and (suspected) exposure to covid-19: Secondary | ICD-10-CM | POA: Diagnosis present

## 2021-12-09 DIAGNOSIS — Z7982 Long term (current) use of aspirin: Secondary | ICD-10-CM

## 2021-12-09 DIAGNOSIS — R29721 NIHSS score 21: Secondary | ICD-10-CM | POA: Diagnosis not present

## 2021-12-09 DIAGNOSIS — R739 Hyperglycemia, unspecified: Secondary | ICD-10-CM | POA: Diagnosis present

## 2021-12-09 DIAGNOSIS — Z8249 Family history of ischemic heart disease and other diseases of the circulatory system: Secondary | ICD-10-CM

## 2021-12-09 DIAGNOSIS — G8191 Hemiplegia, unspecified affecting right dominant side: Secondary | ICD-10-CM | POA: Diagnosis present

## 2021-12-09 DIAGNOSIS — R040 Epistaxis: Secondary | ICD-10-CM | POA: Diagnosis not present

## 2021-12-09 DIAGNOSIS — Z9049 Acquired absence of other specified parts of digestive tract: Secondary | ICD-10-CM

## 2021-12-09 DIAGNOSIS — I2510 Atherosclerotic heart disease of native coronary artery without angina pectoris: Secondary | ICD-10-CM | POA: Diagnosis present

## 2021-12-09 DIAGNOSIS — I63512 Cerebral infarction due to unspecified occlusion or stenosis of left middle cerebral artery: Principal | ICD-10-CM | POA: Diagnosis present

## 2021-12-09 DIAGNOSIS — R29712 NIHSS score 12: Secondary | ICD-10-CM | POA: Diagnosis not present

## 2021-12-09 DIAGNOSIS — R29714 NIHSS score 14: Secondary | ICD-10-CM | POA: Diagnosis not present

## 2021-12-09 DIAGNOSIS — G43909 Migraine, unspecified, not intractable, without status migrainosus: Secondary | ICD-10-CM | POA: Diagnosis present

## 2021-12-09 DIAGNOSIS — Z880 Allergy status to penicillin: Secondary | ICD-10-CM

## 2021-12-09 DIAGNOSIS — I639 Cerebral infarction, unspecified: Secondary | ICD-10-CM | POA: Diagnosis present

## 2021-12-09 DIAGNOSIS — R1310 Dysphagia, unspecified: Secondary | ICD-10-CM | POA: Diagnosis present

## 2021-12-09 DIAGNOSIS — R29716 NIHSS score 16: Secondary | ICD-10-CM | POA: Diagnosis present

## 2021-12-09 LAB — COMPREHENSIVE METABOLIC PANEL
ALT: 85 U/L — ABNORMAL HIGH (ref 0–44)
AST: 63 U/L — ABNORMAL HIGH (ref 15–41)
Albumin: 4 g/dL (ref 3.5–5.0)
Alkaline Phosphatase: 89 U/L (ref 38–126)
Anion gap: 10 (ref 5–15)
BUN: 20 mg/dL (ref 8–23)
CO2: 22 mmol/L (ref 22–32)
Calcium: 8.7 mg/dL — ABNORMAL LOW (ref 8.9–10.3)
Chloride: 106 mmol/L (ref 98–111)
Creatinine, Ser: 1.21 mg/dL (ref 0.61–1.24)
GFR, Estimated: >60 mL/min (ref >60)
Glucose, Bld: 124 mg/dL — ABNORMAL HIGH (ref 70–99)
Potassium: 3.7 mmol/L (ref 3.5–5.1)
Sodium: 138 mmol/L (ref 135–145)
Total Bilirubin: 0.7 mg/dL (ref 0.3–1.2)
Total Protein: 7.7 g/dL (ref 6.5–8.1)

## 2021-12-09 LAB — DIFFERENTIAL
Abs Immature Granulocytes: 0.01 10*3/uL (ref 0.00–0.07)
Basophils Absolute: 0 10*3/uL (ref 0.0–0.1)
Basophils Relative: 0 %
Eosinophils Absolute: 0.3 10*3/uL (ref 0.0–0.5)
Eosinophils Relative: 3 %
Immature Granulocytes: 0 %
Lymphocytes Relative: 49 %
Lymphs Abs: 5.3 10*3/uL — ABNORMAL HIGH (ref 0.7–4.0)
Monocytes Absolute: 0.8 10*3/uL (ref 0.1–1.0)
Monocytes Relative: 7 %
Neutro Abs: 4.6 10*3/uL (ref 1.7–7.7)
Neutrophils Relative %: 41 %

## 2021-12-09 LAB — CBC
HCT: 45 % (ref 39.0–52.0)
Hemoglobin: 15.8 g/dL (ref 13.0–17.0)
MCH: 32.7 pg (ref 26.0–34.0)
MCHC: 35.1 g/dL (ref 30.0–36.0)
MCV: 93.2 fL (ref 80.0–100.0)
Platelets: 196 10*3/uL (ref 150–400)
RBC: 4.83 MIL/uL (ref 4.22–5.81)
RDW: 13.9 % (ref 11.5–15.5)
WBC: 11.1 10*3/uL — ABNORMAL HIGH (ref 4.0–10.5)
nRBC: 0 % (ref 0.0–0.2)

## 2021-12-09 LAB — APTT: aPTT: 24 seconds (ref 24–36)

## 2021-12-09 LAB — CBG MONITORING, ED: Glucose-Capillary: 128 mg/dL — ABNORMAL HIGH (ref 70–99)

## 2021-12-09 LAB — PROTIME-INR
INR: 1.1 (ref 0.8–1.2)
Prothrombin Time: 14 seconds (ref 11.4–15.2)

## 2021-12-09 LAB — ETHANOL: Alcohol, Ethyl (B): <10 mg/dL (ref <10)

## 2021-12-09 MED ORDER — IOHEXOL 350 MG/ML SOLN
100.0000 mL | Freq: Once | INTRAVENOUS | Status: AC | PRN
Start: 2021-12-09 — End: 2021-12-09
  Administered 2021-12-09: 100 mL via INTRAVENOUS

## 2021-12-09 MED ORDER — TENECTEPLASE FOR STROKE
0.2500 mg/kg | PACK | Freq: Once | INTRAVENOUS | Status: AC
Start: 2021-12-09 — End: 2021-12-09
  Administered 2021-12-09: 23 mg via INTRAVENOUS

## 2021-12-09 MED ORDER — TENECTEPLASE FOR STROKE
PACK | INTRAVENOUS | Status: AC
  Filled 2021-12-09: qty 10

## 2021-12-09 NOTE — ED Provider Notes (Signed)
[facility] [place] EMERGENCY DEPARTMENT Provider Note   CSN: [id number] Arrival date & time: 12/09/21  2230     History {Add pertinent medical, surgical, social history, OB history to HPI:1} Chief Complaint  Patient presents with   Code Stroke    Terry Mayo is a 66 y.o. male.  Patient with history of hypertension and recent MI.  At around 9:50 PM he started to have slurred speech that was witnessed by his wife.  She also states that he was perspiring   Weakness Severity:  Severe Onset quality:  Sudden Timing:  Constant Progression:  Worsening Chronicity:  New Context: not alcohol use   Relieved by:  Nothing Worsened by:  Nothing Ineffective treatments:  None tried Associated symptoms: no abdominal pain        Home Medications Prior to Admission medications   Medication Sig Start Date End Date Taking? Authorizing Provider  acetaminophen (TYLENOL) 650 MG CR tablet Take 650 mg by mouth every 8 (eight) hours as needed for pain.    [provider]  aspirin EC 81 MG tablet Take 1 tablet (81 mg total) by mouth daily. Swallow whole. 12/07/21   Margie Billet, NP  atorvastatin (LIPITOR) 40 MG tablet Take 40 mg by mouth daily. 11/09/21   [provider]  clopidogrel (PLAVIX) 75 MG tablet Take 1 tablet (75 mg total) by mouth daily with breakfast. 12/07/21   Margie Billet, NP  isosorbide mononitrate (IMDUR) 30 MG 24 hr tablet Take 0.5 tablets (15 mg total) by mouth daily. 12/07/21   Margie Billet, NP  losartan (COZAAR) 25 MG tablet Take 25 mg by mouth daily. 11/25/21   [provider]  metoprolol succinate (TOPROL-XL) 25 MG 24 hr tablet Take 0.5 tablets (12.5 mg total) by mouth daily. 12/07/21   Margie Billet, NP  nitroGLYCERIN (NITROSTAT) 0.4 MG SL tablet Place 1 tablet (0.4 mg total) under the tongue every 5 (five) minutes x 3 doses as needed for chest pain. 12/06/21   Margie Billet, NP  spironolactone (ALDACTONE) 25 MG tablet Take 0.5 tablets (12.5 mg total) by mouth daily. 12/07/21    Margie Billet, NP      Allergies    Penicillins    Review of Systems   Review of Systems  Unable to perform ROS: Mental status change  Gastrointestinal:  Negative for abdominal pain.  Neurological:  Positive for weakness.    Physical Exam Updated Vital Signs BP (!) 170/94   Pulse 68   Temp 99.5 F (37.5 C)   Resp 18   Ht 6\' 2"  (1.88 m)   Wt 90.7 kg   SpO2 98%   BMI 25.67 kg/m  Physical Exam Vitals and nursing note reviewed.  Constitutional:      Appearance: He is well-developed.  HENT:     Head: Normocephalic.     Nose: Nose normal.  Eyes:     General: No scleral icterus.    Conjunctiva/sclera: Conjunctivae normal.  Neck:     Thyroid: No thyromegaly.  Cardiovascular:     Rate and Rhythm: Normal rate and regular rhythm.     Heart sounds: No murmur heard.    No friction rub. No gallop.  Pulmonary:     Breath sounds: No stridor. No wheezing or rales.  Chest:     Chest wall: No tenderness.  Abdominal:     General: There is no distension.     Tenderness: There is no abdominal tenderness. There is no rebound.  Musculoskeletal:  Cervical back: Neck supple.     Comments: Patient has profound weakness of his right leg.  And patient is not moving her right arm.  He is alert but slurred speech  Lymphadenopathy:     Cervical: No cervical adenopathy.  Skin:    Findings: No erythema or rash.  Neurological:     Mental Status: He is alert.     Motor: No abnormal muscle tone.     Coordination: Coordination normal.     Comments: Oriented to person     ED Results / Procedures / Treatments   Labs (all labs ordered are listed, but only abnormal results are displayed) Labs Reviewed  CBC - Abnormal; Notable for the following components:      Result Value   WBC 11.1 (*)    All other components within normal limits  DIFFERENTIAL - Abnormal; Notable for the following components:   Lymphs Abs 5.3 (*)    All other components within normal limits  COMPREHENSIVE  METABOLIC PANEL - Abnormal; Notable for the following components:   Glucose, Bld 124 (*)    Calcium 8.7 (*)    AST 63 (*)    ALT 85 (*)    All other components within normal limits  CBG MONITORING, ED - Abnormal; Notable for the following components:   Glucose-Capillary 128 (*)    All other components within normal limits  RESP PANEL BY RT-PCR (FLU A&B, COVID) ARPGX2  ETHANOL  PROTIME-INR  APTT  RAPID URINE DRUG SCREEN, HOSP PERFORMED  URINALYSIS, ROUTINE W REFLEX MICROSCOPIC  I-STAT CHEM 8, ED    EKG None  Radiology CT HEAD CODE STROKE WO CONTRAST  Result Date: 12/09/2021 CLINICAL DATA:  Code stroke.  Sudden onset weakness EXAM: CT HEAD WITHOUT CONTRAST TECHNIQUE: Contiguous axial images were obtained from the base of the skull through the vertex without intravenous contrast. RADIATION DOSE REDUCTION: This exam was performed according to the departmental dose-optimization program which includes automated exposure control, adjustment of the mA and/or kV according to patient size and/or use of iterative reconstruction technique. COMPARISON:  None Available. FINDINGS: Brain: No evidence of acute infarction, hemorrhage, cerebral edema, mass, mass effect, or midline shift. No hydrocephalus or extra-axial collection. Vascular: No definite hyperdense vessel. Skull: Negative for fracture or focal lesion. Sinuses/Orbits: No acute finding. Other: The mastoid air cells are well aerated. ASPECTS [facility] Stroke Program Early CT Score) - Ganglionic level infarction (caudate, lentiform nuclei, internal capsule, insula, M1-M3 cortex): 7 - Supraganglionic infarction (M4-M6 cortex): 3 Total score (0-10 with 10 being normal): 10 IMPRESSION: 1. No acute intracranial process. 2. ASPECTS is 10 Code stroke imaging results were communicated on 12/09/2021 at 10:44 pm to provider Hendrick Pavich via telephone, who verbally acknowledged these results. Electronically Signed   By: Wiliam Ke M.D.   On: 12/09/2021 22:46     Procedures Procedures  {Document cardiac monitor, telemetry assessment procedure when appropriate:1}  Medications Ordered in ED Medications  tenecteplase (TNKASE) injection for Stroke 23 mg (23 mg Intravenous Given 12/09/21 2309)  iohexol (OMNIPAQUE) 350 MG/ML injection 100 mL (100 mLs Intravenous Contrast Given 12/09/21 2314)    ED Course/ Medical Decision Making/ A&P  CRITICAL CARE Performed by: Bethann Berkshire Total critical care time: 45 minutes Critical care time was exclusive of separately billable procedures and treating other patients. Critical care was necessary to treat or prevent imminent or life-threatening deterioration. Critical care was time spent personally by me on the following activities: development of treatment plan with patient and/or surrogate as  well as nursing, discussions with consultants, evaluation of patient's response to treatment, examination of patient, obtaining history from patient or surrogate, ordering and performing treatments and interventions, ordering and review of laboratory studies, ordering and review of radiographic studies, pulse oximetry and re-evaluation of patient's condition. Code stroke was called immediately.  He was seen by the telemetry neurologist and TNK was given.  CT angio shows a large occlusion of the left MCA.  He will be transferred to [facility] and be seen by the interventional radiologist                         Medical Decision Making Amount and/or Complexity of Data Reviewed Labs: ordered. Radiology: ordered.   Patient with a left MCA occlusion and significant stroke.  Patient will be admitted to [facility]  {Document critical care time when appropriate:1} {Document review of labs and clinical decision tools ie heart score, Chads2Vasc2 etc:1}  {Document your independent review of radiology images, and any outside records:1} {Document your discussion with family members, caretakers, and with  consultants:1} {Document social determinants of health affecting pt's care:1} {Document your decision making why or why not admission, treatments were needed:1} Final Clinical Impression(s) / ED Diagnoses Final diagnoses:  None    Rx / DC Orders ED Discharge Orders     None

## 2021-12-09 NOTE — ED Notes (Signed)
[facility] paged @ 2223

## 2021-12-09 NOTE — ED Triage Notes (Signed)
Pt developed right sided weakness, slurred speech, and facial droop around 2150. Pt is on plavix.

## 2021-12-09 NOTE — Telephone Encounter (Signed)
Called patient to speak with about SOS AMI research study. Left message with wife to call back.  He has appt 08/16 @ 115 with [facility] also.

## 2021-12-10 ENCOUNTER — Inpatient Hospital Stay (HOSPITAL_COMMUNITY): Payer: Medicare HMO

## 2021-12-10 ENCOUNTER — Encounter (HOSPITAL_COMMUNITY)
Admission: EM | Disposition: A | Discharge status: Skilled Nursing Facility | Payer: Self-pay | Source: Home / Self Care | Attending: Neurology

## 2021-12-10 ENCOUNTER — Inpatient Hospital Stay (HOSPITAL_COMMUNITY): Payer: Medicare HMO | Admitting: Certified Registered Nurse Anesthetist

## 2021-12-10 ENCOUNTER — Ambulatory Visit: Payer: Medicare HMO | Admitting: Physician Assistant

## 2021-12-10 ENCOUNTER — Encounter (HOSPITAL_COMMUNITY): Payer: Self-pay | Admitting: Certified Registered Nurse Anesthetist

## 2021-12-10 DIAGNOSIS — I10 Essential (primary) hypertension: Secondary | ICD-10-CM | POA: Diagnosis not present

## 2021-12-10 DIAGNOSIS — I252 Old myocardial infarction: Secondary | ICD-10-CM | POA: Diagnosis not present

## 2021-12-10 DIAGNOSIS — I5022 Chronic systolic (congestive) heart failure: Secondary | ICD-10-CM | POA: Diagnosis present

## 2021-12-10 DIAGNOSIS — I639 Cerebral infarction, unspecified: Secondary | ICD-10-CM | POA: Diagnosis not present

## 2021-12-10 DIAGNOSIS — R040 Epistaxis: Secondary | ICD-10-CM | POA: Diagnosis not present

## 2021-12-10 DIAGNOSIS — G8191 Hemiplegia, unspecified affecting right dominant side: Secondary | ICD-10-CM | POA: Diagnosis present

## 2021-12-10 DIAGNOSIS — I669 Occlusion and stenosis of unspecified cerebral artery: Secondary | ICD-10-CM | POA: Diagnosis not present

## 2021-12-10 DIAGNOSIS — J9601 Acute respiratory failure with hypoxia: Secondary | ICD-10-CM

## 2021-12-10 DIAGNOSIS — I110 Hypertensive heart disease with heart failure: Secondary | ICD-10-CM | POA: Diagnosis present

## 2021-12-10 DIAGNOSIS — I6389 Other cerebral infarction: Secondary | ICD-10-CM

## 2021-12-10 DIAGNOSIS — I6522 Occlusion and stenosis of left carotid artery: Secondary | ICD-10-CM

## 2021-12-10 DIAGNOSIS — I63232 Cerebral infarction due to unspecified occlusion or stenosis of left carotid arteries: Secondary | ICD-10-CM | POA: Diagnosis present

## 2021-12-10 DIAGNOSIS — I255 Ischemic cardiomyopathy: Secondary | ICD-10-CM

## 2021-12-10 DIAGNOSIS — R1312 Dysphagia, oropharyngeal phase: Secondary | ICD-10-CM | POA: Diagnosis not present

## 2021-12-10 DIAGNOSIS — R29716 NIHSS score 16: Secondary | ICD-10-CM | POA: Diagnosis present

## 2021-12-10 DIAGNOSIS — I2510 Atherosclerotic heart disease of native coronary artery without angina pectoris: Secondary | ICD-10-CM

## 2021-12-10 DIAGNOSIS — Z8673 Personal history of transient ischemic attack (TIA), and cerebral infarction without residual deficits: Secondary | ICD-10-CM

## 2021-12-10 DIAGNOSIS — R29712 NIHSS score 12: Secondary | ICD-10-CM | POA: Diagnosis not present

## 2021-12-10 DIAGNOSIS — E785 Hyperlipidemia, unspecified: Secondary | ICD-10-CM

## 2021-12-10 DIAGNOSIS — R29715 NIHSS score 15: Secondary | ICD-10-CM | POA: Diagnosis not present

## 2021-12-10 DIAGNOSIS — R1310 Dysphagia, unspecified: Secondary | ICD-10-CM | POA: Diagnosis present

## 2021-12-10 DIAGNOSIS — I4729 Other ventricular tachycardia: Secondary | ICD-10-CM

## 2021-12-10 DIAGNOSIS — R29710 NIHSS score 10: Secondary | ICD-10-CM | POA: Diagnosis not present

## 2021-12-10 DIAGNOSIS — E7800 Pure hypercholesterolemia, unspecified: Secondary | ICD-10-CM | POA: Diagnosis not present

## 2021-12-10 DIAGNOSIS — R29714 NIHSS score 14: Secondary | ICD-10-CM | POA: Diagnosis not present

## 2021-12-10 DIAGNOSIS — Z20822 Contact with and (suspected) exposure to covid-19: Secondary | ICD-10-CM | POA: Diagnosis present

## 2021-12-10 DIAGNOSIS — F1210 Cannabis abuse, uncomplicated: Secondary | ICD-10-CM | POA: Diagnosis present

## 2021-12-10 DIAGNOSIS — Z87442 Personal history of urinary calculi: Secondary | ICD-10-CM | POA: Diagnosis not present

## 2021-12-10 DIAGNOSIS — I214 Non-ST elevation (NSTEMI) myocardial infarction: Secondary | ICD-10-CM | POA: Diagnosis present

## 2021-12-10 DIAGNOSIS — D72829 Elevated white blood cell count, unspecified: Secondary | ICD-10-CM | POA: Diagnosis present

## 2021-12-10 DIAGNOSIS — R4701 Aphasia: Secondary | ICD-10-CM | POA: Diagnosis present

## 2021-12-10 DIAGNOSIS — R29721 NIHSS score 21: Secondary | ICD-10-CM | POA: Diagnosis not present

## 2021-12-10 DIAGNOSIS — I25119 Atherosclerotic heart disease of native coronary artery with unspecified angina pectoris: Secondary | ICD-10-CM | POA: Diagnosis not present

## 2021-12-10 DIAGNOSIS — R29711 NIHSS score 11: Secondary | ICD-10-CM | POA: Diagnosis not present

## 2021-12-10 DIAGNOSIS — I63312 Cerebral infarction due to thrombosis of left middle cerebral artery: Secondary | ICD-10-CM | POA: Diagnosis not present

## 2021-12-10 DIAGNOSIS — I63512 Cerebral infarction due to unspecified occlusion or stenosis of left middle cerebral artery: Secondary | ICD-10-CM | POA: Diagnosis present

## 2021-12-10 DIAGNOSIS — R471 Dysarthria and anarthria: Secondary | ICD-10-CM | POA: Diagnosis present

## 2021-12-10 HISTORY — PX: IR CT HEAD LTD: IMG2386

## 2021-12-10 HISTORY — PX: IR US GUIDE VASC ACCESS RIGHT: IMG2390

## 2021-12-10 HISTORY — PX: RADIOLOGY WITH ANESTHESIA: SHX6223

## 2021-12-10 HISTORY — PX: IR PERCUTANEOUS ART THROMBECTOMY/INFUSION INTRACRANIAL INC DIAG ANGIO: IMG6087

## 2021-12-10 LAB — URINALYSIS, ROUTINE W REFLEX MICROSCOPIC
Bilirubin Urine: NEGATIVE
Glucose, UA: NEGATIVE mg/dL
Ketones, ur: 5 mg/dL — AB
Leukocytes,Ua: NEGATIVE
Nitrite: NEGATIVE
Protein, ur: 30 mg/dL — AB
Specific Gravity, Urine: 1.033 — ABNORMAL HIGH (ref 1.005–1.030)
pH: 6 (ref 5.0–8.0)

## 2021-12-10 LAB — POCT I-STAT 7, (LYTES, BLD GAS, ICA,H+H)
Acid-base deficit: 5 mmol/L — ABNORMAL HIGH (ref 0.0–2.0)
Bicarbonate: 18.8 mmol/L — ABNORMAL LOW (ref 20.0–28.0)
Calcium, Ion: 1.13 mmol/L — ABNORMAL LOW (ref 1.15–1.40)
HCT: 39 % (ref 39.0–52.0)
Hemoglobin: 13.3 g/dL (ref 13.0–17.0)
O2 Saturation: 99 %
Patient temperature: 97.8
Potassium: 4.3 mmol/L (ref 3.5–5.1)
Sodium: 137 mmol/L (ref 135–145)
TCO2: 20 mmol/L — ABNORMAL LOW (ref 22–32)
pCO2 arterial: 28.6 mmHg — ABNORMAL LOW (ref 32–48)
pH, Arterial: 7.424 (ref 7.35–7.45)
pO2, Arterial: 154 mmHg — ABNORMAL HIGH (ref 83–108)

## 2021-12-10 LAB — ECHOCARDIOGRAM LIMITED
Calc EF: 51.9 %
Height: 74 in
Single Plane A2C EF: 45.2 %
Single Plane A4C EF: 59.4 %
Weight: 3199.32 oz

## 2021-12-10 LAB — GLUCOSE, CAPILLARY
Glucose-Capillary: 121 mg/dL — ABNORMAL HIGH (ref 70–99)
Glucose-Capillary: 123 mg/dL — ABNORMAL HIGH (ref 70–99)
Glucose-Capillary: 128 mg/dL — ABNORMAL HIGH (ref 70–99)
Glucose-Capillary: 134 mg/dL — ABNORMAL HIGH (ref 70–99)
Glucose-Capillary: 152 mg/dL — ABNORMAL HIGH (ref 70–99)

## 2021-12-10 LAB — RAPID URINE DRUG SCREEN, HOSP PERFORMED
Amphetamines: NOT DETECTED
Barbiturates: NOT DETECTED
Benzodiazepines: NOT DETECTED
Cocaine: NOT DETECTED
Opiates: NOT DETECTED
Tetrahydrocannabinol: POSITIVE — AB

## 2021-12-10 LAB — MRSA NEXT GEN BY PCR, NASAL: MRSA by PCR Next Gen: NOT DETECTED

## 2021-12-10 LAB — RESP PANEL BY RT-PCR (FLU A&B, COVID) ARPGX2
Influenza A by PCR: NEGATIVE
Influenza B by PCR: NEGATIVE
SARS Coronavirus 2 by RT PCR: NEGATIVE

## 2021-12-10 SURGERY — IR WITH ANESTHESIA
Anesthesia: General

## 2021-12-10 MED ORDER — POLYETHYLENE GLYCOL 3350 17 G PO PACK: 17.0000 g | PACK | Freq: Every day | ORAL | Status: DC

## 2021-12-10 MED ORDER — FENTANYL CITRATE PF 50 MCG/ML IJ SOSY: 25.0000 ug | PREFILLED_SYRINGE | INTRAMUSCULAR | Status: DC | PRN

## 2021-12-10 MED ORDER — INSULIN ASPART 100 UNIT/ML IJ SOLN
1.0000 [IU] | INTRAMUSCULAR | Status: DC
  Administered 2021-12-10: 2 [IU] via SUBCUTANEOUS
  Administered 2021-12-10 – 2021-12-15 (×11): 1 [IU] via SUBCUTANEOUS

## 2021-12-10 MED ORDER — ACETAMINOPHEN 325 MG PO TABS: 650.0000 mg | ORAL_TABLET | ORAL | Status: DC | PRN

## 2021-12-10 MED ORDER — LIDOCAINE HCL (CARDIAC) PF 100 MG/5ML IV SOSY
PREFILLED_SYRINGE | INTRAVENOUS | Status: DC | PRN
  Administered 2021-12-10: 60 mg via INTRAVENOUS

## 2021-12-10 MED ORDER — ORAL CARE MOUTH RINSE: 15.0000 mL | OROMUCOSAL | Status: DC | PRN

## 2021-12-10 MED ORDER — PROPOFOL 1000 MG/100ML IV EMUL
0.0000 ug/kg/min | INTRAVENOUS | Status: DC
  Administered 2021-12-10: 35 ug/kg/min via INTRAVENOUS
  Filled 2021-12-10: qty 100

## 2021-12-10 MED ORDER — ROCURONIUM BROMIDE 100 MG/10ML IV SOLN
INTRAVENOUS | Status: DC | PRN
  Administered 2021-12-10: 60 mg via INTRAVENOUS

## 2021-12-10 MED ORDER — FENTANYL CITRATE PF 50 MCG/ML IJ SOSY
25.0000 ug | PREFILLED_SYRINGE | INTRAMUSCULAR | Status: DC | PRN
  Administered 2021-12-10: 50 ug via INTRAVENOUS
  Filled 2021-12-10: qty 2

## 2021-12-10 MED ORDER — DOCUSATE SODIUM 50 MG/5ML PO LIQD
100.0000 mg | Freq: Two times a day (BID) | ORAL | Status: DC
  Administered 2021-12-13 – 2021-12-15 (×4): 100 mg
  Filled 2021-12-10 (×5): qty 10

## 2021-12-10 MED ORDER — EPHEDRINE SULFATE (PRESSORS) 50 MG/ML IJ SOLN
INTRAMUSCULAR | Status: DC | PRN
  Administered 2021-12-10 (×2): 2.5 mg via INTRAVENOUS

## 2021-12-10 MED ORDER — SUCCINYLCHOLINE CHLORIDE 200 MG/10ML IV SOSY
PREFILLED_SYRINGE | INTRAVENOUS | Status: DC | PRN
  Administered 2021-12-10: 120 mg via INTRAVENOUS

## 2021-12-10 MED ORDER — PANTOPRAZOLE SODIUM 40 MG IV SOLR
40.0000 mg | Freq: Every day | INTRAVENOUS | Status: DC
  Administered 2021-12-10 – 2021-12-11 (×2): 40 mg via INTRAVENOUS
  Filled 2021-12-10 (×2): qty 10

## 2021-12-10 MED ORDER — PROPOFOL 1000 MG/100ML IV EMUL
INTRAVENOUS | Status: AC
  Administered 2021-12-10: 50 ug/kg/min via INTRAVENOUS
  Filled 2021-12-10: qty 100

## 2021-12-10 MED ORDER — DOCUSATE SODIUM 50 MG/5ML PO LIQD: 100.0000 mg | Freq: Two times a day (BID) | ORAL | Status: DC

## 2021-12-10 MED ORDER — PROPOFOL 10 MG/ML IV BOLUS
INTRAVENOUS | Status: DC | PRN
  Administered 2021-12-10: 30 mg via INTRAVENOUS
  Administered 2021-12-10: 100 mg via INTRAVENOUS

## 2021-12-10 MED ORDER — PHENYLEPHRINE HCL-NACL 20-0.9 MG/250ML-% IV SOLN
25.0000 ug/min | INTRAVENOUS | Status: DC
  Administered 2021-12-10: 30 ug/min via INTRAVENOUS

## 2021-12-10 MED ORDER — ACETAMINOPHEN 160 MG/5ML PO SOLN: 650.0000 mg | ORAL | Status: DC | PRN

## 2021-12-10 MED ORDER — LABETALOL HCL 5 MG/ML IV SOLN
20.0000 mg | INTRAVENOUS | Status: DC | PRN
  Administered 2021-12-10: 20 mg via INTRAVENOUS
  Filled 2021-12-10: qty 4

## 2021-12-10 MED ORDER — SENNOSIDES-DOCUSATE SODIUM 8.6-50 MG PO TABS: 1.0000 | ORAL_TABLET | Freq: Every evening | ORAL | Status: DC | PRN

## 2021-12-10 MED ORDER — CLEVIDIPINE BUTYRATE 0.5 MG/ML IV EMUL
0.0000 mg/h | INTRAVENOUS | Status: DC
  Administered 2021-12-10: 2 mg/h via INTRAVENOUS
  Filled 2021-12-10 (×2): qty 100
  Filled 2021-12-10: qty 50
  Filled 2021-12-10: qty 100

## 2021-12-10 MED ORDER — PHENYLEPHRINE HCL (PRESSORS) 10 MG/ML IV SOLN
INTRAVENOUS | Status: DC | PRN
  Administered 2021-12-10 (×3): 80 ug via INTRAVENOUS

## 2021-12-10 MED ORDER — ONDANSETRON HCL 4 MG/2ML IJ SOLN
INTRAMUSCULAR | Status: DC | PRN
  Administered 2021-12-10: 4 mg via INTRAVENOUS

## 2021-12-10 MED ORDER — DEXAMETHASONE SODIUM PHOSPHATE 10 MG/ML IJ SOLN
INTRAMUSCULAR | Status: DC | PRN
  Administered 2021-12-10: 5 mg via INTRAVENOUS

## 2021-12-10 MED ORDER — SODIUM CHLORIDE 0.9 % IV SOLN
INTRAVENOUS | Status: DC
Start: 2021-12-10 — End: 2021-12-16

## 2021-12-10 MED ORDER — HYDRALAZINE HCL 20 MG/ML IJ SOLN
20.0000 mg | INTRAMUSCULAR | Status: DC | PRN
Start: 2021-12-10 — End: 2021-12-16
  Administered 2021-12-10: 20 mg via INTRAVENOUS
  Filled 2021-12-10 (×2): qty 1

## 2021-12-10 MED ORDER — CLEVIDIPINE BUTYRATE 0.5 MG/ML IV EMUL: 0.0000 mg/h | INTRAVENOUS | Status: DC

## 2021-12-10 MED ORDER — FENTANYL CITRATE (PF) 100 MCG/2ML IJ SOLN
INTRAMUSCULAR | Status: AC
  Filled 2021-12-10: qty 2

## 2021-12-10 MED ORDER — CHLORHEXIDINE GLUCONATE CLOTH 2 % EX PADS
6.0000 | MEDICATED_PAD | Freq: Every day | CUTANEOUS | Status: DC
  Administered 2021-12-11 (×2): 6 via TOPICAL

## 2021-12-10 MED ORDER — HYDRALAZINE HCL 20 MG/ML IJ SOLN
10.0000 mg | INTRAMUSCULAR | Status: DC | PRN
  Administered 2021-12-10: 10 mg via INTRAVENOUS
  Filled 2021-12-10: qty 1

## 2021-12-10 MED ORDER — ONDANSETRON HCL 4 MG/2ML IJ SOLN: 4.0000 mg | Freq: Once | INTRAMUSCULAR | Status: AC

## 2021-12-10 MED ORDER — POLYETHYLENE GLYCOL 3350 17 G PO PACK
17.0000 g | PACK | Freq: Every day | ORAL | Status: DC
  Administered 2021-12-13 – 2021-12-15 (×3): 17 g
  Filled 2021-12-10 (×4): qty 1

## 2021-12-10 MED ORDER — ORAL CARE MOUTH RINSE
15.0000 mL | OROMUCOSAL | Status: DC
  Administered 2021-12-10 – 2021-12-11 (×5): 15 mL via OROMUCOSAL

## 2021-12-10 MED ORDER — LABETALOL HCL 5 MG/ML IV SOLN: 10.0000 mg | INTRAVENOUS | Status: DC | PRN

## 2021-12-10 MED ORDER — ONDANSETRON HCL 4 MG/2ML IJ SOLN
INTRAMUSCULAR | Status: AC
  Administered 2021-12-10: 4 mg via INTRAVENOUS
  Filled 2021-12-10: qty 2

## 2021-12-10 MED ORDER — PHENYLEPHRINE HCL-NACL 20-0.9 MG/250ML-% IV SOLN
INTRAVENOUS | Status: DC | PRN
  Administered 2021-12-10: 20 ug/min via INTRAVENOUS

## 2021-12-10 MED ORDER — ORAL CARE MOUTH RINSE
15.0000 mL | OROMUCOSAL | Status: DC
  Administered 2021-12-10 (×4): 15 mL via OROMUCOSAL

## 2021-12-10 MED ORDER — STROKE: EARLY STAGES OF RECOVERY BOOK
Freq: Once | Status: AC
  Filled 2021-12-10: qty 1

## 2021-12-10 MED ORDER — PROPOFOL 500 MG/50ML IV EMUL
INTRAVENOUS | Status: DC | PRN
  Administered 2021-12-10: 75 ug/kg/min via INTRAVENOUS

## 2021-12-10 MED ORDER — ACETAMINOPHEN 650 MG RE SUPP: 650.0000 mg | RECTAL | Status: DC | PRN

## 2021-12-10 MED ORDER — IOHEXOL 300 MG/ML  SOLN
100.0000 mL | Freq: Once | INTRAMUSCULAR | Status: AC | PRN
  Administered 2021-12-10: 70 mL via INTRA_ARTERIAL

## 2021-12-10 MED ORDER — SUGAMMADEX SODIUM 200 MG/2ML IV SOLN
INTRAVENOUS | Status: DC | PRN
  Administered 2021-12-10: 200 mg via INTRAVENOUS

## 2021-12-10 MED ORDER — SODIUM CHLORIDE 0.9 % IV SOLN: INTRAVENOUS | Status: DC | PRN

## 2021-12-10 MED ORDER — ESMOLOL HCL 100 MG/10ML IV SOLN
INTRAVENOUS | Status: DC | PRN
  Administered 2021-12-10: 20 mg via INTRAVENOUS

## 2021-12-10 MED ORDER — SODIUM CHLORIDE 0.9 % IV SOLN: 250.0000 mL | INTRAVENOUS | Status: DC

## 2021-12-10 MED ORDER — FENTANYL CITRATE (PF) 100 MCG/2ML IJ SOLN
INTRAMUSCULAR | Status: DC | PRN
  Administered 2021-12-10: 50 ug via INTRAVENOUS

## 2021-12-10 MED ORDER — OXYMETAZOLINE HCL 0.05 % NA SOLN
3.0000 | Freq: Two times a day (BID) | NASAL | Status: AC | PRN
Start: 2021-12-10 — End: 2021-12-13
  Administered 2021-12-10: 3 via NASAL
  Filled 2021-12-10: qty 30

## 2021-12-10 NOTE — ED Notes (Signed)
[facility] arrived to transport pt.  

## 2021-12-10 NOTE — Progress Notes (Signed)
2228 Code stroke activated- pre-elert 2230 door time- TNK prepulled to bedside 2334 pt to CT 2339 TS paged 2240 pt back from CT 2251 Dr Anette Riedel on camera-told of negative NCCT 2302 TNK ordered 2309 23mg  TNK IV given after time out performed. 2312 pt back to CT for CTA/P 2325 pt back from CT

## 2021-12-10 NOTE — TOC Progression Note (Signed)
Transition of Care [facility]) - Progression Note    Patient Details  Name: Terry Mayo MRN: [id number] Date of Birth: [birth year]  Transition of Care [facility]) CM/SW Contact  Beckie Busing, RN Phone [phone]  12/10/2021, 4:24 PM  Clinical Narrative:    TOC following for disposition planning for patient admitted with acute onset right-sided weakness. Current CIR recommendation with CIR following.           Expected Discharge Plan and Services                                                 Social Determinants of Health (SDOH) Interventions    Readmission Risk Interventions     No data to display

## 2021-12-10 NOTE — Transfer of Care (Signed)
Immediate Anesthesia Transfer of Care Note  Patient: Terry Mayo  Procedure(s) Performed: IR WITH ANESTHESIA  Patient Location: ICU  Anesthesia Type:General  Level of Consciousness: Patient remains intubated per anesthesia plan  Airway & Oxygen Therapy: Patient remains intubated per anesthesia plan and Patient placed on Ventilator (see vital sign flow sheet for setting)  Post-op Assessment: Report given to RN and Post -op Vital signs reviewed and stable  Post vital signs: Reviewed and stable  Last Vitals:  Vitals Value Taken Time  BP    Temp    Pulse 56 12/10/21 0304  Resp 16 12/10/21 0304  SpO2 100 % 12/10/21 0304  Vitals shown include unvalidated device data.  Last Pain:  Vitals:   12/09/21 2232  PainSc: 0-No pain         Complications: No notable events documented.

## 2021-12-10 NOTE — Progress Notes (Signed)
eLink Physician-Brief Progress Note Patient Name: Terry Mayo DOB: [birth year] MRN: [id number]   Date of Service  12/10/2021  HPI/Events of Note  Patient transferred to [facility] post-TNK for thrombectomy of left MCA territory CVA, following the procedure he developed epistaxis and was transferred to the ICU intubated and mechanically ventilated  eICU Interventions  New Patient Evaluation.        Terry Mayo [name] 12/10/2021, 3:27 AM

## 2021-12-10 NOTE — ED Notes (Signed)
Pt actively vomiting, Dr. Judd Lien made aware. Orders received.

## 2021-12-10 NOTE — Procedures (Addendum)
INTERVENTIONAL NEURORADIOLOGY BRIEF POSTPROCEDURE NOTE  DIAGNOSTIC CEREBRAL ANGIOGRAM AND MECHANICAL THROMBECTOMY  Attending: Dr. Baldemar Lenis  Diagnosis: Left ICA terminus occlusion.  Access site: RCFA  Access closure: Perclose Prostyle  Anesthesia: GETA  Medication used: refer to anesthesia documentation.  Complications: None.  Estimated blood loss: 50 mL  Specimen: None.  Findings: Occlusion of the left ICA terminus completely recanalized after 2 direct contact aspiration passes (TICI 3). No thromboembolic or hemorrhagic complication.  The patient tolerated the procedure well without incident or complication and is in stable condition.   PLAN: - Bed rest x 6 hours post femoral access - SBP 120-140 mmHg  UPDATE: - Patient will remain intubated until he can sit up due to TNK related epistaxis.

## 2021-12-10 NOTE — Procedures (Signed)
Extubation Procedure Note  Patient Details:   Name: MARRION ACCOMANDO DOB: May 27, 1955 MRN: [id number]   Airway Documentation:    Vent end date: 12/10/21 Vent end time: 0938   Evaluation  O2 sats: stable throughout Complications: No apparent complications Patient did tolerate procedure well. Bilateral Breath Sounds: Rhonchi, Diminished   Yes, pt could speak post extubation.  Pt extuated to 3 l/m Bells.  Audrie Lia 12/10/2021, 9:39 AM

## 2021-12-10 NOTE — Evaluation (Signed)
Physical Therapy Evaluation Patient Details Name: Terry Mayo MRN: [id number] DOB: January 07, 1956 Today's Date: 12/10/2021  History of Present Illness  Pt is a 66 y/o male admitted 8/15 with acute onset of right-sided weakness and dysarthria.  MRI pending.  PMHx HTN, HLD, former smoker, recent NSTEMI, s/p cath 12/04/2021  Clinical Impression  Pt admitted with/for R sided weakness and communication difficulties.  Pt Not at baseline functioning, needing moderate assist for basic mobility/transfers.  Pt has not ambulated at this point. .  Pt currently limited functionally due to the problems listed. ( See problems list.)   Pt will benefit from PT to maximize function and safety in order to get ready for next venue listed below.        Recommendations for follow up therapy are one component of a multi-disciplinary discharge planning process, led by the attending physician.  Recommendations may be updated based on patient status, additional functional criteria and insurance authorization.  Follow Up Recommendations Acute inpatient rehab (3hours/day)      Assistance Recommended at Discharge Frequent or constant Supervision/Assistance  Patient can return home with the following  A little help with walking and/or transfers;A little help with bathing/dressing/bathroom;Assistance with cooking/housework;Assist for transportation;Help with stairs or ramp for entrance    Equipment Recommendations Other (comment) (TBD)  Recommendations for Other Services  Rehab consult    Functional Status Assessment Patient has had a recent decline in their functional status and demonstrates the ability to make significant improvements in function in a reasonable and predictable amount of time.     Precautions / Restrictions Precautions Precautions: Fall      Mobility  Bed Mobility Overal bed mobility: Needs Assistance Bed Mobility: Rolling, Sidelying to Sit Rolling: Mod assist Sidelying to sit: Mod assist        General bed mobility comments: cues for direction, assist LE's and trunk for coordination of movement up via L Elbow.  Assisting R UE fulling.    Transfers Overall transfer level: Needs assistance   Transfers: Sit to/from Stand Sit to Stand: Mod assist           General transfer comment: cues for hand placement, assist forward and with boost.  Stability assist.    Ambulation/Gait Ambulation/Gait assistance: Mod assist Gait Distance (Feet): 3 Feet (sidestepping toward HOB)   Gait Pattern/deviations: Step-to pattern   Gait velocity interpretation: <1.31 ft/sec, indicative of household ambulator   General Gait Details: assisted w/shifting and stability while helping pt unload to step uncoordinatedly with the R LE.  Stairs            Wheelchair Mobility    Modified Rankin (Stroke Patients Only) Modified Rankin (Stroke Patients Only) Pre-Morbid Rankin Score: No symptoms Modified Rankin: Moderately severe disability     Balance Overall balance assessment: Needs assistance Sitting-balance support: Single extremity supported, No upper extremity supported, Feet supported Sitting balance-Leahy Scale: Fair Sitting balance - Comments: unable to accept challenge toward the R and will fall off R without being able to recover.     Standing balance-Leahy Scale: Poor Standing balance comment: stood x3, pregait activity including w/shifting and rotational activity.  reliant on external assist or stationary surface.                             Pertinent Vitals/Pain Pain Assessment Pain Assessment: Faces Faces Pain Scale: No hurt Pain Intervention(s): Monitored during session    Home Living Family/patient expects to be discharged to:: Private  residence Living Arrangements: Spouse/significant other Available Help at Discharge: Family;Available 24 hours/day Type of Home: House Home Access: Level entry       Home Layout: One level Home Equipment:   (TBD)      Prior Function Prior Level of Function :  (no driving per pt response)               ADLs Comments: pt relays independent with ADL's     Hand Dominance        Extremity/Trunk Assessment   Upper Extremity Assessment Upper Extremity Assessment: Defer to OT evaluation    Lower Extremity Assessment Lower Extremity Assessment: RLE deficits/detail RLE Deficits / Details: incoordination, grossly 4-/5, significantly diminished LT/"pinching".  decreased isolated movement. RLE Sensation: decreased light touch RLE Coordination: decreased fine motor    Cervical / Trunk Assessment Cervical / Trunk Assessment: Normal  Communication   Communication: Expressive difficulties (?some receptive problems)  Cognition Arousal/Alertness: Lethargic, Awake/alert Behavior During Therapy: Flat affect Overall Cognitive Status: Difficult to assess                                          General Comments General comments (skin integrity, edema, etc.): vss    Exercises     Assessment/Plan    PT Assessment Patient needs continued PT services  PT Problem List Decreased strength;Decreased activity tolerance;Decreased balance;Decreased mobility;Decreased coordination;Impaired tone;Impaired sensation       PT Treatment Interventions DME instruction;Gait training;Functional mobility training;Therapeutic activities;Balance training;Neuromuscular re-education;Patient/family education    PT Goals (Current goals can be found in the Care Plan section)  Acute Rehab PT Goals PT Goal Formulation: Patient unable to participate in goal setting Time For Goal Achievement: 12/24/21 Potential to Achieve Goals: Good    Frequency Min 4X/week     Co-evaluation               AM-PAC PT "6 Clicks" Mobility  Outcome Measure Help needed turning from your back to your side while in a flat bed without using bedrails?: A Lot Help needed moving from lying on your back to  sitting on the side of a flat bed without using bedrails?: A Lot Help needed moving to and from a bed to a chair (including a wheelchair)?: A Lot Help needed standing up from a chair using your arms (e.g., wheelchair or bedside chair)?: A Lot Help needed to walk in hospital room?: A Little Help needed climbing 3-5 steps with a railing? : A Little 6 Click Score: 14    End of Session   Activity Tolerance: Patient tolerated treatment well;Patient limited by fatigue Patient left: in bed;with call bell/phone within reach Nurse Communication: Mobility status PT Visit Diagnosis: Other abnormalities of gait and mobility (R26.89);Hemiplegia and hemiparesis Hemiplegia - Right/Left: Right Hemiplegia - caused by: Cerebral infarction    Time: 1610-9604 PT Time Calculation (min) (ACUTE ONLY): 28 min   Charges:   PT Evaluation $PT Eval Moderate Complexity: 1 Mod PT Treatments $Therapeutic Activity: 8-22 mins        12/10/2021  Jacinto Halim., PT Acute Rehabilitation Services [phone]  (pager) [phone]  (office)  [name] Dempsy Damiano 12/10/2021, 12:39 PM

## 2021-12-10 NOTE — Progress Notes (Signed)
Inpatient Rehab Admissions Coordinator Note:   Per PT recommendations patient was screened for CIR candidacy by Stephania Fragmin, PT. At this time, pt appears to be a potential candidate for CIR. I will place an order for rehab consult for full assessment, per our protocol.  Please contact me any with questions.Estill Dooms, PT, DPT [phone] 12/10/21 1:02 PM

## 2021-12-10 NOTE — Progress Notes (Signed)
   NAME:  ILHAN MADAN, MRN:  [id number], DOB:  08/25/1955, LOS: 0 ADMISSION DATE:  12/09/2021, CONSULTATION DATE:  12/10/21 REFERRING MD:  Tommie Sams, CHIEF COMPLAINT:  right sided weakness and trouble speaking  History of Present Illness:  TRAYE BATES is a 66 y.o. male with a past medical history significant for hypertension, hyperlipidemia, former smoking, BMI 25.67, recent NSTEMI s/p cath 12/04/2021 (medical management).   He was doing well after discharge until he developed acute onset right-sided weakness and dysarthria for which he presented to the ED for evaluation. Had been having headaches for the past couple of weeks (his typical migraines, but these had been resolved for the last few years).  Wife had stepped away to use the bathroom, and found him 5 to 10 minutes later with severely slurred speech and right-sided weakness as well as some confusion.  She notes his weight is very variable but denies any other health concerns. He was initially evaluated by telespecialists who administered TNK. LKW: 2150 Per telemetry specialists evaluation TNK given?:  2309  Pertinent  Medical History   Past Medical History:  Diagnosis Date   Arthritis    Essential hypertension    Hyperlipidemia    Kidney stones   NSTEMI s/p cath 12/04/21  Significant Hospital Events: Including procedures, antibiotic start and stop dates in addition to other pertinent events   8/16 - TNK and thrombectomy.   Interim History / Subjective:  Transferred to ICU s/p thrombectomy for vent management due to epistaxis  Objective   Blood pressure 131/77, pulse 88, temperature 97.8 F (36.6 C), temperature source Axillary, resp. rate 19, height 6\' 2"  (1.88 m), weight 90.7 kg, SpO2 100 %.    Vent Mode: PRVC FiO2 (%):  [30 %-100 %] 32 % Set Rate:  [16 bmp] 16 bmp Vt Set:  [650 mL] 650 mL PEEP:  [5 cmH20] 5 cmH20 Plateau Pressure:  [16 cmH20] 16 cmH20   Intake/Output Summary (Last 24 hours) at 12/10/2021  1019 Last data filed at 12/10/2021 0917 Gross per 24 hour  Intake 1924.53 ml  Output 60 ml  Net 1864.53 ml   Filed Weights   12/09/21 2232  Weight: 90.7 kg    Examination: General: sedated, ventilated HENT: dried blood right nairs. intubated Lungs: CTAB, no wrr Cardiovascular: RRR, no mrg Abdomen: sfot, nt, +bs Extremities: no deformities Neuro: sedated, rouses to voice. Right side eyelid droop  Resolved Hospital Problem list     Assessment & Plan:   Left internal carotid artery stroke S/p tnk and thrombectomy. Intubated Neurology consulted - resume asa/plavix 24 hr s/p TNK - swallow eval, stroke pathway - tight BP control 120-140  Post-op ventilation Epistaxis Intubated for thrombectomy. Unable to be extubated due to epistaxis. No further bleeding.  82ml brown ngt output.  - wean from vent today - swallow eval - afrin  CAD with Recent NSTEMI NSTEMI with medical management. No stent placed.  - holding ASA and plavix, consider restarting 24 hr s/p TNK - hold losartan - cardiology consulted  HTN - tight BP control 120-140 s/p thrombectomy and TNK   HLD  Best Practice (right click and "Reselect all SmartList Selections" daily)   Diet/type: NPO DVT prophylaxis: not indicated GI prophylaxis: PPI Lines: N/A Foley:  N/A Code Status:  full code Last date of multidisciplinary goals of care discussion []   45m, MD

## 2021-12-10 NOTE — Progress Notes (Signed)
Referring Physician(s): Srishti Bhagat  Supervising Physician: Pedro Earls  Patient Status:  [facility] - In-pt  Chief Complaint:  Code Stroke  Brief History: [facility] is a 66 y.o. male with medical issues including hypertension, hyperlipidemia, former smoking, BMI 25.67, recent NSTEMI s/p cath 12/04/2021 (medical management).   He presented to the ED with right-sided weakness and dysarthria yesterday.   He received TNK at 2309.  He then underwent cerebral intervention by Dr. Karenann Cai around 2 am.  Her note reads= DIAGNOSTIC CEREBRAL ANGIOGRAM AND MECHANICAL THROMBECTOMY   Attending: Dr. Pedro Earls   Diagnosis: Left ICA terminus occlusion.   Access site: RCFA   Access closure: Perclose Prostyle   Anesthesia: GETA   Medication used: refer to anesthesia documentation.   Complications: None.   Estimated blood loss: 50 mL   Specimen: None.   Findings: Occlusion of the left ICA terminus completely recanalized after 2 direct contact aspiration passes (TICI 3). No thromboembolic or hemorrhagic complication.   The patient tolerated the procedure well without incident or complication and is in stable condition.    PLAN: - Bed rest x 6 hours post femoral access - SBP 120-140 mmHg   UPDATE: - Patient will remain intubated until he can sit up due to TNK related epistaxis.  Subjective:  He is lying in bed, awake. Still with dysarthria.   Allergies: Penicillins  Medications: Prior to Admission medications   Medication Sig Start Date End Date Taking? Authorizing Provider  acetaminophen (TYLENOL) 650 MG CR tablet Take 650 mg by mouth every 8 (eight) hours as needed for pain.    [provider]  aspirin EC 81 MG tablet Take 1 tablet (81 mg total) by mouth daily. Swallow whole. 12/07/21   Margie Billet, NP  atorvastatin (LIPITOR) 40 MG tablet Take 40 mg by mouth daily. 11/09/21   [provider]  clopidogrel  (PLAVIX) 75 MG tablet Take 1 tablet (75 mg total) by mouth daily with breakfast. 12/07/21   Margie Billet, NP  isosorbide mononitrate (IMDUR) 30 MG 24 hr tablet Take 0.5 tablets (15 mg total) by mouth daily. 12/07/21   Margie Billet, NP  losartan (COZAAR) 25 MG tablet Take 25 mg by mouth daily. 11/25/21   [provider]  metoprolol succinate (TOPROL-XL) 25 MG 24 hr tablet Take 0.5 tablets (12.5 mg total) by mouth daily. 12/07/21   Margie Billet, NP  nitroGLYCERIN (NITROSTAT) 0.4 MG SL tablet Place 1 tablet (0.4 mg total) under the tongue every 5 (five) minutes x 3 doses as needed for chest pain. 12/06/21   Margie Billet, NP  spironolactone (ALDACTONE) 25 MG tablet Take 0.5 tablets (12.5 mg total) by mouth daily. 12/07/21   Margie Billet, NP     Vital Signs: BP (!) 148/89   Pulse 88   Temp 97.8 F (36.6 C) (Axillary)   Resp 19   Ht _0  (1.88 m)   Wt 199 lb 15.3 oz (90.7 kg)   SpO2 100%   BMI 25.67 kg/m   Physical Exam Vitals reviewed.  Cardiovascular:     Rate and Rhythm: Normal rate and regular rhythm.  Pulmonary:     Effort: Pulmonary effort is normal. No respiratory distress.  Neurological:     Mental Status: He is alert.     Comments: + Dysarthria Right Facial droop 3/5 grip Strength on the right but unable to raise arm. No sensation right arm Able to raise right leg. Common femoral artery  puncture site looks good, no bleeding, no hematoma, no pseudoaneurysm     Imaging: IR PERCUTANEOUS ART THROMBECTOMY/INFUSION INTRACRANIAL INC DIAG ANGIO  Result Date: 12/10/2021 INDICATION: 66 year old male who presented to [facility] with right-sided weakness and dysarthria. His past medical history significant for hypertension, hyperlipidemia, former smoking, BMI 25.67, recent NSTEMI. Head CT showed hypodensity of the left insular cortical ribbon and basal ganglia (ASPECTS 8), no hemorrhage. Intravenous TNK was then administered. CT angiogram of the head and neck showed an occlusion of  the left ICA terminus. He was then transferred to our service for a diagnostic cerebral angiogram and mechanical thrombectomy. EXAM: ULTRASOUND-GUIDED VASCULAR ACCESS DIAGNOSTIC CEREBRAL ANGIOGRAM MECHANICAL THROMBECTOMY FLAT PANEL HEAD CT COMPARISON:  CT/CT angiogram of the head and neck December 09, 2021. MEDICATIONS: No antibiotics administered. ANESTHESIA/SEDATION: The procedure was performed under general anesthesia. CONTRAST:  70 mL of Omnipaque 300 milligram/mL FLUOROSCOPY: Radiation Exposure Index (as provided by the fluoroscopic device): 852.7 mGy Kerma COMPLICATIONS: None immediate. TECHNIQUE: Informed written consent was obtained from the patient's daughter after a thorough discussion of the procedural risks, benefits and alternatives. All questions were addressed. Maximal Sterile Barrier Technique was utilized including caps, mask, sterile gowns, sterile gloves, sterile drape, hand hygiene and skin antiseptic. A timeout was performed prior to the initiation of the procedure. The right groin was prepped and draped in the usual sterile fashion. Using a micropuncture kit and the modified Seldinger technique, access was gained to the right common femoral artery and an 8 French sheath was placed. Real-time ultrasound guidance was utilized for vascular access including the acquisition of a permanent ultrasound image documenting patency of the accessed vessel. Under fluoroscopy, a Zoom 88 guide catheter was navigated over a 6 [place] Berenstein 2 catheter and a 0.035" Terumo Glidewire into the aortic arch. Attempt to catheterize the left common carotid artery proved unsuccessful. The Berenstein 2 catheter was then exchanged for a VTK catheter. The catheter was placed into the left common carotid artery and then advanced into the left internal carotid artery. The diagnostic catheter was removed. Frontal and lateral angiograms of the head were obtained. FINDINGS: 1. Normal caliber of the right common femoral artery,  adequate for vascular access. 2. 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 3. Severe tortuosity of the cervical left ICA and proximal left common carotid artery. PROCEDURE: Using biplane roadmap, a zoom 71 aspiration catheter was navigated over Colossus 35 microguidewire into the cavernous segment of the left ICA. The aspiration catheter was then advanced to the level of occlusion and connected to an aspiration pump. Continuous aspiration was performed for 2 minutes. The guide catheter was connected to a VacLok syringe. The aspiration catheter was subsequently removed under constant aspiration. The guide catheter was aspirated for debris. Left internal carotid artery angiogram showed recanalization of the left ICA terminus with patent but stenotic proximal left M1/MCA related to residual thrombus with slow distal flow (TICI 2C). Using biplane roadmap, a zoom 71 aspiration catheter was navigated over Colossus 35 microguidewire into the cavernous segment of the left ICA. The aspiration catheter was then advanced to the level of occlusion in the M1 segment and connected to an aspiration pump. Continuous aspiration was performed for 2 minutes. The guide catheter was connected to a VacLok syringe. The aspiration catheter was subsequently removed under constant aspiration. The guide catheter was aspirated for debris. Left internal carotid artery angiogram with frontal and lateral views of the head showed  complete recanalization of the left ICA, ACA and MCA vascular territories. Flat panel CT of the head was obtained and post processed in a separate workstation with concurrent attending physician supervision. Selected images were sent to PACS. No evidence of hemorrhagic complication. Delayed left internal carotid artery angiograms with frontal and lateral views of the head showed persistent patency of the left anterior circulation. The catheter was subsequently  withdrawn. Right common femoral artery angiogram was obtained in right anterior oblique view. The puncture is at the level of the common femoral artery. The artery has normal caliber, adequate for closure device. The sheath was exchanged over the wire for a Perclose prostyle which was utilized for access closure. Immediate hemostasis was achieved. IMPRESSION: 1. Successful mechanical thrombectomy for treatment of a left ICA terminus occlusion with direct contact aspiration achieving complete recanalization (TICI 3). 2. No thromboembolic or hemorrhagic complication. PLAN: Transfer to ICU for continued post stroke care. Electronically Signed   By: Pedro Earls M.D.   On: 12/10/2021 10:24   IR US Guide Vasc Access Right  Result Date: 12/10/2021 INDICATION: 66 year old male who presented to [facility] with right-sided weakness and dysarthria. His past medical history significant for hypertension, hyperlipidemia, former smoking, BMI 25.67, recent NSTEMI. Head CT showed hypodensity of the left insular cortical ribbon and basal ganglia (ASPECTS 8), no hemorrhage. Intravenous TNK was then administered. CT angiogram of the head and neck showed an occlusion of the left ICA terminus. He was then transferred to our service for a diagnostic cerebral angiogram and mechanical thrombectomy. EXAM: ULTRASOUND-GUIDED VASCULAR ACCESS DIAGNOSTIC CEREBRAL ANGIOGRAM MECHANICAL THROMBECTOMY FLAT PANEL HEAD CT COMPARISON:  CT/CT angiogram of the head and neck December 09, 2021. MEDICATIONS: No antibiotics administered. ANESTHESIA/SEDATION: The procedure was performed under general anesthesia. CONTRAST:  70 mL of Omnipaque 300 milligram/mL FLUOROSCOPY: Radiation Exposure Index (as provided by the fluoroscopic device): 527.7 mGy Kerma COMPLICATIONS: None immediate. TECHNIQUE: Informed written consent was obtained from the patient's daughter after a thorough discussion of the procedural risks, benefits and  alternatives. All questions were addressed. Maximal Sterile Barrier Technique was utilized including caps, mask, sterile gowns, sterile gloves, sterile drape, hand hygiene and skin antiseptic. A timeout was performed prior to the initiation of the procedure. The right groin was prepped and draped in the usual sterile fashion. Using a micropuncture kit and the modified Seldinger technique, access was gained to the right common femoral artery and an 8 French sheath was placed. Real-time ultrasound guidance was utilized for vascular access including the acquisition of a permanent ultrasound image documenting patency of the accessed vessel. Under fluoroscopy, a Zoom 88 guide catheter was navigated over a 6 [place] Berenstein 2 catheter and a 0.035" Terumo Glidewire into the aortic arch. Attempt to catheterize the left common carotid artery proved unsuccessful. The Berenstein 2 catheter was then exchanged for a VTK catheter. The catheter was placed into the left common carotid artery and then advanced into the left internal carotid artery. The diagnostic catheter was removed. Frontal and lateral angiograms of the head were obtained. FINDINGS: 1. Normal caliber of the right common femoral artery, adequate for vascular access. 2. 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 3. Severe tortuosity of the cervical left ICA and proximal left common carotid artery. PROCEDURE: Using biplane roadmap, a zoom 71 aspiration catheter was navigated over Colossus 35 microguidewire into the cavernous segment of the left ICA. The aspiration catheter was then advanced to  the level of occlusion and connected to an aspiration pump. Continuous aspiration was performed for 2 minutes. The guide catheter was connected to a VacLok syringe. The aspiration catheter was subsequently removed under constant aspiration. The guide catheter was aspirated for debris. Left internal carotid artery  angiogram showed recanalization of the left ICA terminus with patent but stenotic proximal left M1/MCA related to residual thrombus with slow distal flow (TICI 2C). Using biplane roadmap, a zoom 71 aspiration catheter was navigated over Colossus 35 microguidewire into the cavernous segment of the left ICA. The aspiration catheter was then advanced to the level of occlusion in the M1 segment and connected to an aspiration pump. Continuous aspiration was performed for 2 minutes. The guide catheter was connected to a VacLok syringe. The aspiration catheter was subsequently removed under constant aspiration. The guide catheter was aspirated for debris. Left internal carotid artery angiogram with frontal and lateral views of the head showed complete recanalization of the left ICA, ACA and MCA vascular territories. Flat panel CT of the head was obtained and post processed in a separate workstation with concurrent attending physician supervision. Selected images were sent to PACS. No evidence of hemorrhagic complication. Delayed left internal carotid artery angiograms with frontal and lateral views of the head showed persistent patency of the left anterior circulation. The catheter was subsequently withdrawn. Right common femoral artery angiogram was obtained in right anterior oblique view. The puncture is at the level of the common femoral artery. The artery has normal caliber, adequate for closure device. The sheath was exchanged over the wire for a Perclose prostyle which was utilized for access closure. Immediate hemostasis was achieved. IMPRESSION: 1. Successful mechanical thrombectomy for treatment of a left ICA terminus occlusion with direct contact aspiration achieving complete recanalization (TICI 3). 2. No thromboembolic or hemorrhagic complication. PLAN: Transfer to ICU for continued post stroke care. Electronically Signed   By: Pedro Earls M.D.   On: 12/10/2021 10:24   IR CT Head  Ltd  Result Date: 12/10/2021 INDICATION: 66 year old male who presented to [facility] with right-sided weakness and dysarthria. His past medical history significant for hypertension, hyperlipidemia, former smoking, BMI 25.67, recent NSTEMI. Head CT showed hypodensity of the left insular cortical ribbon and basal ganglia (ASPECTS 8), no hemorrhage. Intravenous TNK was then administered. CT angiogram of the head and neck showed an occlusion of the left ICA terminus. He was then transferred to our service for a diagnostic cerebral angiogram and mechanical thrombectomy. EXAM: ULTRASOUND-GUIDED VASCULAR ACCESS DIAGNOSTIC CEREBRAL ANGIOGRAM MECHANICAL THROMBECTOMY FLAT PANEL HEAD CT COMPARISON:  CT/CT angiogram of the head and neck December 09, 2021. MEDICATIONS: No antibiotics administered. ANESTHESIA/SEDATION: The procedure was performed under general anesthesia. CONTRAST:  70 mL of Omnipaque 300 milligram/mL FLUOROSCOPY: Radiation Exposure Index (as provided by the fluoroscopic device): 270.3 mGy Kerma COMPLICATIONS: None immediate. TECHNIQUE: Informed written consent was obtained from the patient's daughter after a thorough discussion of the procedural risks, benefits and alternatives. All questions were addressed. Maximal Sterile Barrier Technique was utilized including caps, mask, sterile gowns, sterile gloves, sterile drape, hand hygiene and skin antiseptic. A timeout was performed prior to the initiation of the procedure. The right groin was prepped and draped in the usual sterile fashion. Using a micropuncture kit and the modified Seldinger technique, access was gained to the right common femoral artery and an 8 French sheath was placed. Real-time ultrasound guidance was utilized for vascular access including the acquisition of a permanent ultrasound image documenting patency of  the accessed vessel. Under fluoroscopy, a Zoom 88 guide catheter was navigated over a 6 [place] Berenstein 2 catheter and a  0.035" Terumo Glidewire into the aortic arch. Attempt to catheterize the left common carotid artery proved unsuccessful. The Berenstein 2 catheter was then exchanged for a VTK catheter. The catheter was placed into the left common carotid artery and then advanced into the left internal carotid artery. The diagnostic catheter was removed. Frontal and lateral angiograms of the head were obtained. FINDINGS: 1. Normal caliber of the right common femoral artery, adequate for vascular access. 2. 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 3. Severe tortuosity of the cervical left ICA and proximal left common carotid artery. PROCEDURE: Using biplane roadmap, a zoom 71 aspiration catheter was navigated over Colossus 35 microguidewire into the cavernous segment of the left ICA. The aspiration catheter was then advanced to the level of occlusion and connected to an aspiration pump. Continuous aspiration was performed for 2 minutes. The guide catheter was connected to a VacLok syringe. The aspiration catheter was subsequently removed under constant aspiration. The guide catheter was aspirated for debris. Left internal carotid artery angiogram showed recanalization of the left ICA terminus with patent but stenotic proximal left M1/MCA related to residual thrombus with slow distal flow (TICI 2C). Using biplane roadmap, a zoom 71 aspiration catheter was navigated over Colossus 35 microguidewire into the cavernous segment of the left ICA. The aspiration catheter was then advanced to the level of occlusion in the M1 segment and connected to an aspiration pump. Continuous aspiration was performed for 2 minutes. The guide catheter was connected to a VacLok syringe. The aspiration catheter was subsequently removed under constant aspiration. The guide catheter was aspirated for debris. Left internal carotid artery angiogram with frontal and lateral views of the head showed  complete recanalization of the left ICA, ACA and MCA vascular territories. Flat panel CT of the head was obtained and post processed in a separate workstation with concurrent attending physician supervision. Selected images were sent to PACS. No evidence of hemorrhagic complication. Delayed left internal carotid artery angiograms with frontal and lateral views of the head showed persistent patency of the left anterior circulation. The catheter was subsequently withdrawn. Right common femoral artery angiogram was obtained in right anterior oblique view. The puncture is at the level of the common femoral artery. The artery has normal caliber, adequate for closure device. The sheath was exchanged over the wire for a Perclose prostyle which was utilized for access closure. Immediate hemostasis was achieved. IMPRESSION: 1. Successful mechanical thrombectomy for treatment of a left ICA terminus occlusion with direct contact aspiration achieving complete recanalization (TICI 3). 2. No thromboembolic or hemorrhagic complication. PLAN: Transfer to ICU for continued post stroke care. Electronically Signed   By: Pedro Earls M.D.   On: 12/10/2021 10:24   DG CHEST PORT 1 VIEW  Result Date: 12/10/2021 CLINICAL DATA:  Intubation and OG tube placement. EXAM: PORTABLE CHEST 1 VIEW COMPARISON:  12/04/2021. FINDINGS: The heart size and mediastinal contours are within normal limits. Linear densities are present over the lateral right lung which may be due to external structures. No consolidation, effusion, or pneumothorax. The endotracheal tube terminates 7.4 cm above the carina. An enteric tube is seen in the stomach. No acute osseous abnormality. IMPRESSION: 1. No acute cardiopulmonary process. 2. Endotracheal and enteric tubes as described above. Electronically Signed   By: Regan Rakers.D.  On: 12/10/2021 04:15   CT ANGIO HEAD NECK W WO CM W PERF (CODE STROKE)  Result Date: 12/09/2021 CLINICAL DATA:   Right-sided weakness, slurred speech, facial droop EXAM: CT ANGIOGRAPHY HEAD AND NECK CT PERFUSION BRAIN TECHNIQUE: Multidetector CT imaging of the head and neck was performed using the standard protocol during bolus administration of intravenous contrast. Multiplanar CT image reconstructions and MIPs were obtained to evaluate the vascular anatomy. Carotid stenosis measurements (when applicable) are obtained utilizing NASCET criteria, using the distal internal carotid diameter as the denominator. Multiphase CT imaging of the brain was performed following IV bolus contrast injection. Subsequent parametric perfusion maps were calculated using RAPID software. RADIATION DOSE REDUCTION: This exam was performed according to the departmental dose-optimization program which includes automated exposure control, adjustment of the mA and/or kV according to patient size and/or use of iterative reconstruction technique. CONTRAST:  132m OMNIPAQUE IOHEXOL 350 MG/ML SOLN COMPARISON:  12/09/2021 CT head, no prior CTA FINDINGS: CT HEAD FINDINGS For noncontrast findings, please see same day CT head. CTA NECK FINDINGS Aortic arch: 4 vessel arch, with the origin of the left vertebral artery from the aorta. Imaged portion shows no evidence of aneurysm or dissection. No significant stenosis of the major arch vessel origins. Right carotid system: No evidence of dissection, occlusion, or hemodynamically significant stenosis (greater than 50%). Left carotid system: No evidence of dissection, occlusion, or hemodynamically significant stenosis (greater than 50%). Vertebral arteries: No evidence of dissection, occlusion, or hemodynamically significant stenosis (greater than 50%). Skeleton: No acute osseous abnormality. Other neck: Negative Upper chest: Centrilobular and paraseptal emphysema. No focal pulmonary opacity or pleural effusion. Review of the MIP images confirms the above findings CTA HEAD FINDINGS Anterior circulation: Focal cutoff  of the left ICA near the terminus (series 6, image 97). The right ICA is patent to the terminus without significant stenosis. A1 segments patent, with the left A1 likely filling from retrograde flow or collaterals. The origin of the left A1 is not opacified. Normal anterior communicating artery. Anterior cerebral arteries are patent to their distal aspects. The left MCA is not definitively opacified nor are its branches opacified. The right M1 and distal MCA branches are perfused without significant stenosis. MCA branches perfused and symmetric. Posterior circulation: Vertebral arteries patent to the vertebrobasilar junction without stenosis. Posterior inferior cerebellar arteries patent proximally. Basilar patent to its distal aspect. Superior cerebellar arteries patent proximally. Patent P1 segments. PCAs perfused to their distal aspects without stenosis. The left posterior communicating artery is visualized. Venous sinuses: As permitted by contrast timing, patent. Anatomic variants: None significant. Review of the MIP images confirms the above findings CT Brain Perfusion Findings: ASPECTS: 10, although in retrospect there is some loss of definition of the insula. CBF (<30%) Volume: 660mPerfusion (Tmax>6.0s) volume: 35920mismatch Volume: 290m24mfarction Location:Left frontal and lobe in the left MCA territory. IMPRESSION: 1. Occlusion of the distal left ICA (series 6, image 97), with no definite opacification of the left MCA and its branches. 2. Infarct core in the left MCA territory measures 69 mL, with penumbra measuring 359 mL with a mismatch volume of 290 mL. 3. No other hemodynamically significant stenosis in the head or neck. Code stroke imaging results were communicated on 12/09/2021 at 11:44 pm to provider ZAMMIT via telephone, who verbally acknowledged these results. Electronically Signed   By: AlisMerilyn Baba.   On: 12/09/2021 23:54   CT HEAD CODE STROKE WO CONTRAST  Result Date:  12/09/2021 CLINICAL DATA:  Code stroke.  Sudden onset weakness EXAM: CT HEAD WITHOUT CONTRAST TECHNIQUE: Contiguous axial images were obtained from the base of the skull through the vertex without intravenous contrast. RADIATION DOSE REDUCTION: This exam was performed according to the departmental dose-optimization program which includes automated exposure control, adjustment of the mA and/or kV according to patient size and/or use of iterative reconstruction technique. COMPARISON:  None Available. FINDINGS: Brain: No evidence of acute infarction, hemorrhage, cerebral edema, mass, mass effect, or midline shift. No hydrocephalus or extra-axial collection. Vascular: No definite hyperdense vessel. Skull: Negative for fracture or focal lesion. Sinuses/Orbits: No acute finding. Other: The mastoid air cells are well aerated. ASPECTS [facility] Stroke Program Early CT Score) - Ganglionic level infarction (caudate, lentiform nuclei, internal capsule, insula, M1-M3 cortex): 7 - Supraganglionic infarction (M4-M6 cortex): 3 Total score (0-10 with 10 being normal): 10 IMPRESSION: 1. No acute intracranial process. 2. ASPECTS is 10 Code stroke imaging results were communicated on 12/09/2021 at 10:44 pm to provider ZAMMIT via telephone, who verbally acknowledged these results. Electronically Signed   By: Merilyn Baba M.D.   On: 12/09/2021 22:46    Labs:  CBC: Recent Labs    12/04/21 0340 12/04/21 1614 12/05/21 0317 12/09/21 2235 12/10/21 0350  WBC 10.0 10.8* 12.5* 11.1*  --   HGB 16.4 12.1* 14.2 15.8 13.3  HCT 47.1 34.0* 39.9 45.0 39.0  PLT 178 138* 151 196  --     COAGS: Recent Labs    12/09/21 2235  INR 1.1  APTT 24    BMP: Recent Labs    12/05/21 0317 12/06/21 0241 12/06/21 0732 12/09/21 2235 12/10/21 0350  NA 136 137 135 138 137  K 4.0 4.0 3.9 3.7 4.3  CL 109 105 105 106  --   CO2 21* _0 --   GLUCOSE 129* 108* 134* 124*  --   BUN 7* _1 --   CALCIUM 8.4* 8.8* 8.7* 8.7*  --    CREATININE 0.84 1.01 1.14 1.21  --   GFRNONAA >60 >60 >60 >60  --     LIVER FUNCTION TESTS: Recent Labs    12/04/21 0340 12/09/21 2235  BILITOT 1.0 0.7  AST 38 63*  ALT 28 85*  ALKPHOS 84 89  PROT 7.6 7.7  ALBUMIN 4.1 4.0    Assessment and Plan:  Code stroke = s/p mechanical thrombectomy by Dr. Karenann Cai  Care per neurology. Ok for PT/OT eval.  Electronically Signed: Murrell Redden, PA-C 12/10/2021, 10:54 AM    I spent a total of 15 Minutes at the the patient's bedside AND on the patient's hospital floor or unit, greater than 50% of which was counseling/coordinating care for cerebral intervention.

## 2021-12-10 NOTE — Progress Notes (Signed)
Echocardiogram 2D Echocardiogram has been performed.  Warren Lacy Zaidin Blyden RDCS 12/10/2021, 10:31 AM

## 2021-12-10 NOTE — Evaluation (Signed)
Speech Language Pathology Evaluation Patient Details Name: Terry Mayo MRN: [id number] DOB: 1955-12-25 Today's Date: 12/10/2021 Time: 4008-6761 SLP Time Calculation (min) (ACUTE ONLY): 20 min  Problem List:  Patient Active Problem List   Diagnosis Date Noted   Status post stroke 12/10/2021   Acute left ICA ischemic stroke (HCC) 12/10/2021   CAD (coronary artery disease) 12/05/2021   Ischemic cardiomyopathy 12/05/2021   NSVT (nonsustained ventricular tachycardia) (HCC) 12/05/2021   Hypertension 12/05/2021   Hyperlipidemia 12/05/2021   NSTEMI (non-ST elevated myocardial infarction) (HCC) 12/04/2021   Colon cancer screening    Loss of weight 09/04/2015   Encounter for screening colonoscopy 09/04/2015   Past Medical History:  Past Medical History:  Diagnosis Date   Arthritis    Essential hypertension    Hyperlipidemia    Kidney stones    Past Surgical History:  Past Surgical History:  Procedure Laterality Date   CHOLECYSTECTOMY  1990s   COLONOSCOPY WITH PROPOFOL N/A 09/30/2015   Procedure: COLONOSCOPY WITH PROPOFOL;  Surgeon: Corbin Ade, MD;  [facility];  Service: Endoscopy;  Laterality: N/A;  930   IR CT HEAD LTD  12/10/2021   IR PERCUTANEOUS ART THROMBECTOMY/INFUSION INTRACRANIAL INC DIAG ANGIO  12/10/2021   IR US GUIDE VASC ACCESS RIGHT  12/10/2021   KIDNEY STONE SURGERY  2005   LEFT HEART CATH AND CORONARY ANGIOGRAPHY N/A 12/04/2021   Procedure: LEFT HEART CATH AND CORONARY ANGIOGRAPHY;  Surgeon: Lyn Records, MD;  [facility];  Service: Cardiovascular;  Laterality: N/A;   HPI:  Patient is a 66 y.o. male with PMH: HTN, HLD, former smoker, recent NSTEMI s/p cath (12/04/21). He was reportedly doing well since cath procedure until he developed acute onset right sided weakness and dysarthria. He presented to the ED for evaluation and was administered TNK. CT head did not show any acute intracranial abnormality but CT angio head neck showed definite  opacification of the left MCA and its branches; Infarct core in the left MCA territory. MRI pending. He was intubted on 12/10/21 for thrombectomy at 0130 and extubated same day 0938.   Assessment / Plan / Recommendation Clinical Impression  Patient is currently presenting with a moderate flaccid dysarthria, mild voice disorder (question actual cause as he was only intubated for 8 hours), mild-moderate cognitive-linguistic disorder. Speech intelligibility was significantly reduced at even word level secondary to flaccid right side facial and bilabial weakness and decreased ROM. He was oriented to approximate time (states year as "22" when given cue "two thousand and......"). He exhibited language errors in areas of confrontational naming, divergent naming but performed well with repetition at word and short phrase level and responsive naming at basic level. At times, SLP unable to judge language secondar to significantly impaired speech intelligibility. SLP is recommending skilled services while hospitalized as well as at next venue of care.    SLP Assessment  SLP Recommendation/Assessment: Patient needs continued Speech Lanaguage Pathology Services SLP Visit Diagnosis: Cognitive communication deficit (R41.841);Dysarthria and anarthria (R47.1);Aphasia (R47.01)    Recommendations for follow up therapy are one component of a multi-disciplinary discharge planning process, led by the attending physician.  Recommendations may be updated based on patient status, additional functional criteria and insurance authorization.    Follow Up Recommendations  Acute inpatient rehab (3hours/day)    Assistance Recommended at Discharge  Frequent or constant Supervision/Assistance  Functional Status Assessment Patient has had a recent decline in their functional status and demonstrates the ability to make significant improvements  in function in a reasonable and predictable amount of time.  Frequency and Duration min  2x/week  2 weeks      SLP Evaluation Cognition  Overall Cognitive Status: Impaired/Different from baseline Orientation Level: Oriented to person;Oriented to place;Oriented to time;Disoriented to situation Year: 2022 Month: August Attention: Sustained Sustained Attention: Impaired Sustained Attention Impairment: Verbal basic;Functional basic Memory: Impaired Memory Impairment: Decreased recall of new information;Retrieval deficit Awareness: Impaired Awareness Impairment: Intellectual impairment;Emergent impairment       Comprehension  Auditory Comprehension Overall Auditory Comprehension: Appears within functional limits for tasks assessed    Expression Expression Primary Mode of Expression: Verbal Verbal Expression Overall Verbal Expression: Impaired Initiation: No impairment Level of Generative/Spontaneous Verbalization: Phrase;Word Repetition: No impairment Naming: Impairment Responsive: 51-75% accurate Confrontation: Impaired Convergent: Not tested Divergent: 25-49% accurate Verbal Errors: Perseveration Pragmatics: No impairment Interfering Components: Attention Effective Techniques: Open ended questions;Semantic cues Non-Verbal Means of Communication: Not applicable   Oral / Motor  Oral Motor/Sensory Function Overall Oral Motor/Sensory Function: Severe impairment Facial ROM: Reduced right;Suspected CN VII (facial) dysfunction Facial Symmetry: Abnormal symmetry right;Suspected CN VII (facial) dysfunction Facial Strength: Reduced right;Suspected CN VII (facial) dysfunction Lingual ROM: Reduced right;Suspected CN XII (hypoglossal) dysfunction Lingual Symmetry: Abnormal symmetry right;Suspected CN XII (hypoglossal) dysfunction Lingual Strength: Reduced Motor Speech Overall Motor Speech: Impaired Respiration: Impaired Level of Impairment: Phrase Phonation: Hoarse Resonance: Within functional limits Articulation: Impaired Level of Impairment:  Phrase Intelligibility: Intelligibility reduced Word: 25-49% accurate Phrase: 0-24% accurate Sentence: Not tested Conversation: Not tested Motor Planning: Witnin functional limits Motor Speech Errors: Not applicable Effective Techniques: Slow rate           Angela Nevin, MA, CCC-SLP Speech Therapy

## 2021-12-10 NOTE — Evaluation (Signed)
Clinical/Bedside Swallow Evaluation Patient Details  Name: Terry Mayo MRN: [id number] Date of Birth: 03-Jul-1955  Today's Date: 12/10/2021 Time: SLP Start Time (ACUTE ONLY): 1345 SLP Stop Time (ACUTE ONLY): 1400 SLP Time Calculation (min) (ACUTE ONLY): 15 min  Past Medical History:  Past Medical History:  Diagnosis Date   Arthritis    Essential hypertension    Hyperlipidemia    Kidney stones    Past Surgical History:  Past Surgical History:  Procedure Laterality Date   CHOLECYSTECTOMY  1990s   COLONOSCOPY WITH PROPOFOL N/A 09/30/2015   Procedure: COLONOSCOPY WITH PROPOFOL;  Surgeon: Corbin Ade, MD;  [facility];  Service: Endoscopy;  Laterality: N/A;  930   IR CT HEAD LTD  12/10/2021   IR PERCUTANEOUS ART THROMBECTOMY/INFUSION INTRACRANIAL INC DIAG ANGIO  12/10/2021   IR US GUIDE VASC ACCESS RIGHT  12/10/2021   KIDNEY STONE SURGERY  2005   LEFT HEART CATH AND CORONARY ANGIOGRAPHY N/A 12/04/2021   Procedure: LEFT HEART CATH AND CORONARY ANGIOGRAPHY;  Surgeon: Lyn Records, MD;  [facility]: [facility];  Service: Cardiovascular;  Laterality: N/A;   HPI:  Patient is a 66 y.o. male with PMH: HTN, HLD, former smoker, recent NSTEMI s/p cath (12/04/21). He was reportedly doing well since cath procedure until he developed acute onset right sided weakness and dysarthria. He presented to the ED for evaluation and was administered TNK. CT head did not show any acute intracranial abnormality but CT angio head neck showed definite opacification of the left MCA and its branches; Infarct core in the left MCA territory. MRI pending. He was intubted on 12/10/21 for thrombectomy at 0130 and extubated same day 0938.    Assessment / Plan / Recommendation  Clinical Impression  Patient presenting with clinical s/s of dysphagia as per this bedside/clinical swallow evaluation. SLP tested patient's swallow with small ice chips and spoon sips of plain, thin water. With ice chips he exhibited  minimal mastication and SLP and no swallow observed. With thin liquids by spoon (water), patient exhibited poor oral preparatory phase (not forming mouth on spoon and allowing SLP to pour water into anterior portion of oral cavity). He did exhibit a delayed swallow initiation with teaspoon sips water, however per palpation, pharyngeal contraction appeared impaired. SLP is recommending continue NPO but allow for small ice chips and/or spoon sips of water PRN after oral care with nursing or therapy only. SLP will f/u next date. SLP Visit Diagnosis: Dysphagia, unspecified (R13.10)    Aspiration Risk  Mild aspiration risk;Moderate aspiration risk    Diet Recommendation NPO   Medication Administration: Via alternative means    Other  Recommendations Oral Care Recommendations: Oral care QID;Staff/trained caregiver to provide oral care;Oral care before and after PO    Recommendations for follow up therapy are one component of a multi-disciplinary discharge planning process, led by the attending physician.  Recommendations may be updated based on patient status, additional functional criteria and insurance authorization.  Follow up Recommendations Acute inpatient rehab (3hours/day)      Assistance Recommended at Discharge Frequent or constant Supervision/Assistance  Functional Status Assessment Patient has had a recent decline in their functional status and demonstrates the ability to make significant improvements in function in a reasonable and predictable amount of time.  Frequency and Duration min 2x/week  2 weeks       Prognosis Prognosis for Safe Diet Advancement: Good Barriers to Reach Goals: Time post onset;Severity of deficits  Swallow Study   General Date of Onset: 12/10/21 HPI: Patient is a 66 y.o. male with PMH: HTN, HLD, former smoker, recent NSTEMI s/p cath (12/04/21). He was reportedly doing well since cath procedure until he developed acute onset right sided weakness and  dysarthria. He presented to the ED for evaluation and was administered TNK. CT head did not show any acute intracranial abnormality but CT angio head neck showed definite opacification of the left MCA and its branches; Infarct core in the left MCA territory. MRI pending. He was intubted on 12/10/21 for thrombectomy at 0130 and extubated same day 0938. Type of Study: Bedside Swallow Evaluation Previous Swallow Assessment: none found Diet Prior to this Study: NPO Temperature Spikes Noted: No History of Recent Intubation: Yes Length of Intubations (days): 1 days (for procedure (4765-4650)) Date extubated: 12/10/21 Behavior/Cognition: Alert;Cooperative;Pleasant mood;Lethargic/Drowsy Oral Cavity Assessment: Excessive secretions Oral Care Completed by SLP: Yes Oral Cavity - Dentition: Adequate natural dentition Self-Feeding Abilities: Total assist Patient Positioning: Upright in bed Baseline Vocal Quality: Low vocal intensity;Hoarse Volitional Cough: Weak Volitional Swallow: Unable to elicit    Oral/Motor/Sensory Function Overall Oral Motor/Sensory Function: Severe impairment Facial ROM: Reduced right;Suspected CN VII (facial) dysfunction Facial Symmetry: Abnormal symmetry right;Suspected CN VII (facial) dysfunction Facial Strength: Reduced right;Suspected CN VII (facial) dysfunction Lingual ROM: Reduced right;Suspected CN XII (hypoglossal) dysfunction Lingual Symmetry: Abnormal symmetry right;Suspected CN XII (hypoglossal) dysfunction Lingual Strength: Reduced   Ice Chips Ice chips: Impaired Presentation: Spoon Oral Phase Impairments: Reduced lingual movement/coordination;Reduced labial seal Pharyngeal Phase Impairments: Unable to trigger swallow   Thin Liquid Thin Liquid: Impaired Presentation: Spoon Oral Phase Impairments: Reduced labial seal;Reduced lingual movement/coordination Pharyngeal  Phase Impairments: Suspected delayed Swallow;Decreased hyoid-laryngeal movement    Nectar Thick      Honey Thick     Puree Puree: Not tested   Solid     Solid: Not tested      Angela Nevin, MA, CCC-SLP Speech Therapy

## 2021-12-10 NOTE — Consult Note (Signed)
[facility] TeleNeurology Consult Services   Patient Name:   Terry Mayo, Terry Mayo Date of Birth:   August 22, 1955 Identification Number:   MRN - [id number] Date of Service:   12/09/2021 22:39:28  Diagnosis:       I63.9 - Cerebrovascular accident (CVA), unspecified mechanism (HCC)  Impression:      66 y/o man with history of ischemic CM, HTN, and HLD presenting with acute onset of right sided weakness and dysarthria. Arrived within thrombolytic window and after discussion of risk and benefits with patient and daughter at bedside, decision made to administer administer TNK. CTA official read stating a distal left ICA stenosis, however there is significant perfusion mismatch on CTP. There looks to be likely left M1 occlusion and clinically he looks to have a large vessel occlusion. Decision made to transfer to [facility] for further management.  Discussed with NIR Text: Discussed with Dr. Iver Nestle (Neurohospitalist at [facility]) who is accepting of transfer. She is discussing with NIR Dr. Sherlon Handing as well.  Our recommendations are outlined below. Recommendations: IV Tenecteplase recommended.  I confirmed the following. (Patient name, DOB, MRN, Blood Pressure, dose of Thrombolytic and waste, weight completed by stretcher/scale not stated weight, have ED staff inform ED MD of thrombolytic decision) Thrombolytic bolus given Without Complication.   IV Tenecteplase Total Dose - 22.7 mg   Routine post Thrombolytic monitoring including neuro checks and blood pressure control during/after treatment Monitor blood pressure Check blood pressure and neuro assessment every 15 min for 2 h, then every 30 min for 6 h, and finally every hour for 16 h.  Manage Blood Pressure per post Thrombolytic protocol.        Follow designated hospital protocol for admission and post thrombolytic care       CT brain 24 hours post Thrombolytic       NPO until swallowing screen performed and passed       No antiplatelet  agents or anticoagulants (including heparin for DVT prophylaxis) in first 24 hours       No Foley catheter, nasogastric tube, arterial catheter or central venous catheter for 24 hr, unless absolutely necessary       Telemetry       Bedside swallow evaluation       HOB less than 30 degrees       Euglycemia       Avoid hyperthermia, PRN acetaminophen       DVT prophylaxis       Inpatient Neurology Consultation       Stroke evaluation as per inpatient neurology recommendations  Discussed with ED physician    ------------------------------------------------------------------------------  Advanced Imaging: CTA Head and Neck Completed.  CTP Completed.  LVO:Yes  Discussed with NIR :Yes  Initial Call Time To NIR : 12/10/2021 00:05:56  NIR Accept/Decision Time : 12/10/2021 00:06:49  Discussed with NIR Time : 12/10/2021 00:06:49  Discussed with NIR Text : Discussed with Dr. Iver Nestle (Neurohospitalist at [facility]) who is accepting of transfer. She is discussing with NIR Dr. Sherlon Handing as well.  Neurointerventionalist Accepted Case : Case accepted for intervention.   Metrics: Last Known Well: 12/09/2021 21:50:00 [facility] Notification Time: 12/09/2021 22:39:27 Arrival Time: 12/09/2021 22:30:00 Stamp Time: 12/09/2021 22:39:28 Initial Response Time: 12/09/2021 22:46:00 Symptoms: right sided weakness, slurred speech. Initial patient interaction: 12/09/2021 22:53:51 NIHSS Assessment Completed: 12/09/2021 22:58:00 Patient is a candidate for Thrombolytic. Thrombolytic Medical Decision: 12/09/2021 23:01:24 Needle Time: 12/09/2021 00:93:81 Weight Noted by Staff: 90.7 kg  CT head showed no acute hemorrhage or acute core infarct.  Primary Provider Notified of Diagnostic Impression and Management Plan on: 12/09/2021 23:14:05    ------------------------------------------------------------------------------  Thrombolytic Contraindications:  Last Known Well > 4.5 hours: No CT Head  showing hemorrhage: No Ischemic stroke within 3 months: No Severe head trauma within 3 months: No Intracranial/intraspinal surgery within 3 months: No History of intracranial hemorrhage: No Symptoms and signs consistent with an SAH: No GI malignancy or GI bleed within 21 days: No Coagulopathy: Platelets <100 000 /mm3, INR >1.7, aPTT>40 s, or PT >15 s: No Treatment dose of LMWH within the previous 24 hrs: No Use of NOACs in past 48 hours: No Glycoprotein IIb/IIIa receptor inhibitors use: No Symptoms consistent with infective endocarditis: No Suspected aortic arch dissection: No Intra-axial intracranial neoplasm: No  Thrombolytic Decision and Management Plan: Management with thrombolytic treatment was explained to the Family as was risks and benefits and alternatives to the treatment. Patient agrees with the decision to proceed with thrombolytic treatment. . All questions were answered and the Family expressed understanding of the treatment plan.   History of Present Illness: Patient is a 66 year old Male.  Patient was brought by EMS for symptoms of right sided weakness, slurred speech. 66 y/o man with history of ischemic CM, CAD, HTN, HLD presenting with right sided weakness and slurred speech. At approximately 2150, patient suddenly developed weakness of the right arm and leg, as well as dysarthria. Brought here by EMS and stroke alert called. Currently on plavix alone. Daughter at bedside to provide history and consent.    Past Medical History:      Hypertension      Hyperlipidemia      Coronary Artery Disease [place] PMH:  ischemic cardiomyopathy  Medications:  No Anticoagulant use  Antiplatelet use: Yes Plavix Reviewed EMR for current medications  Allergies:  Reviewed  Social History: Drug Use: No  Family History:  There is no family history of premature cerebrovascular disease pertinent to this consultation  ROS : 14 Points Review of Systems was performed and was  negative except mentioned in HPI.  Past Surgical History: There Is No Surgical History Contributory To Today's Visit   Examination: BP(146/85), Pulse(102), Blood Glucose(128) 1A: Level of Consciousness - Alert; keenly responsive + 0 1B: Ask Month and Age - Both Questions Right + 0 1C: Blink Eyes & Squeeze Hands - Performs Both Tasks + 0 2: Test Horizontal Extraocular Movements - Partial Gaze Palsy: Corrects with Oculocephalic Reflex + 1 3: Test Visual Fields - No Visual Loss + 0 4: Test Facial Palsy (Use Grimace if Obtunded) - Partial paralysis (lower face) + 2 5A: Test Left Arm Motor Drift - No Drift for 10 Seconds + 0 5B: Test Right Arm Motor Drift - No Movement + 4 6A: Test Left Leg Motor Drift - No Drift for 5 Seconds + 0 6B: Test Right Leg Motor Drift - Some Effort Against Gravity + 2 7: Test Limb Ataxia (FNF/Heel-Shin) - Does Not Understand + 0 8: Test Sensation - Mild-Moderate Loss: Less Sharp/More Dull + 1 9: Test Language/Aphasia - Mild-Moderate Aphasia: Some Obvious Changes, Without Significant Limitation + 1 10: Test Dysarthria - Severe Dysarthria: Unintelligble Slurring or Out of Proportion to Aphasia + 2 11: Test Extinction/Inattention - Visual/tactile/auditory/spatial/personal inattention + 1  NIHSS Score: 14  Pre-Morbid Modified Rankin Scale: 0 Points = No symptoms at all  Spoke with : Dr. Estell Harpin  Patient/Family was informed the Neurology Consult would occur via TeleHealth consult by way of interactive audio and video telecommunications and consented  to receiving care in this manner.   Patient is being evaluated for possible acute neurologic impairment and high probability of imminent or life-threatening deterioration. I spent total of 65 minutes providing care to this patient, including time for face to face visit via telemedicine, review of medical records, imaging studies and discussion of findings with providers, the patient and/or family.   Dr Shari Prows   [facility] For Inpatient follow-up with [facility] physician please call [facility] [phone]. This is not an outpatient service. Post hospital discharge, please contact hospital directly.

## 2021-12-10 NOTE — Progress Notes (Signed)
Per patients Daughter Terry Mayo, she would prefer all to be notified if any visitors/calls besides   Gina Harriston Drucilla Siddle Aniketh Huberty Olga Millers  All others visitors/calls needed to be cleared by Morrie Sheldon.

## 2021-12-10 NOTE — Progress Notes (Addendum)
STROKE TEAM PROGRESS NOTE  Brief HPI:  Terry Mayo is a 65 y.o. male with a past medical history significant for hypertension, hyperlipidemia, former smoking, BMI 25.67, recent NSTEMI s/p cath 12/04/2021 (medical management).  He was doing well after discharge until he developed acute onset right-sided weakness and dysarthria for which he presented to the ED for evaluation. Received IV TNK. To IR with Dr. Joana Reamer Rodrgiuez for Left ICA terminus occlusion with TICI 3 revascularization.  He has been extubated and has been breathing well.  INTERVAL HISTORY  No family at the bedside. RN at bedside. Cleviprex gtt off.  Alert and oriented x 4 with Dysarthria with mild aphasia. Follows commands. Has Left gaze preference can not cross midline with right side facial droop.Right arm flaccid right leg antigravity weak decreased sensation on right side.  24 hr brain imaging @ 0300- MRI brain ordered  Vital signs stable.  Blood pressure adequately controlled.  MRI scan is pending Vitals:   12/10/21 0615 12/10/21 0630 12/10/21 0645 12/10/21 0713  BP: 133/78 125/71 133/76 (!) 140/79  Pulse: (!) 53 (!) 57 (!) 55 (!) 56  Resp: 16 16 16 16   Temp:    97.8 F (36.6 C)  TempSrc:    Axillary  SpO2: 100% 100% 100% 100%  Weight:      Height:       CBC:  Recent Labs  Lab 12/05/21 0317 12/09/21 2235 12/10/21 0350  WBC 12.5* 11.1*  --   NEUTROABS  --  4.6  --   HGB 14.2 15.8 13.3  HCT 39.9 45.0 39.0  MCV 93.0 93.2  --   PLT 151 196  --    Basic Metabolic Panel:  Recent Labs  Lab 12/05/21 0317 12/06/21 0241 12/06/21 0732 12/09/21 2235 12/10/21 0350  NA 136   < > 135 138 137  K 4.0   < > 3.9 3.7 4.3  CL 109   < > 105 106  --   CO2 21*   < > 24 22  --   GLUCOSE 129*   < > 134* 124*  --   BUN 7*   < > 11 20  --   CREATININE 0.84   < > 1.14 1.21  --   CALCIUM 8.4*   < > 8.7* 8.7*  --   MG 1.9  --   --   --   --    < > = values in this interval not displayed.   Lipid Panel:  Recent Labs   Lab 12/05/21 0317  CHOL 99  TRIG 41  HDL 50  CHOLHDL 2.0  VLDL 8  LDLCALC 41   HgbA1c:  Recent Labs  Lab 12/05/21 0317  HGBA1C 5.5   Urine Drug Screen: No results for input(s): "LABOPIA", "COCAINSCRNUR", "LABBENZ", "AMPHETMU", "THCU", "LABBARB" in the last 168 hours.  Alcohol Level  Recent Labs  Lab 12/09/21 2235  ETH <10    IMAGING past 24 hours DG CHEST PORT 1 VIEW  Result Date: 12/10/2021 CLINICAL DATA:  Intubation and OG tube placement. EXAM: PORTABLE CHEST 1 VIEW COMPARISON:  12/04/2021. FINDINGS: The heart size and mediastinal contours are within normal limits. Linear densities are present over the lateral right lung which may be due to external structures. No consolidation, effusion, or pneumothorax. The endotracheal tube terminates 7.4 cm above the carina. An enteric tube is seen in the stomach. No acute osseous abnormality. IMPRESSION: 1. No acute cardiopulmonary process. 2. Endotracheal and enteric tubes as described above.  Electronically Signed   By: Brett Fairy M.D.   On: 12/10/2021 04:15   CT ANGIO HEAD NECK W WO CM W PERF (CODE STROKE)  Result Date: 12/09/2021 CLINICAL DATA:  Right-sided weakness, slurred speech, facial droop EXAM: CT ANGIOGRAPHY HEAD AND NECK CT PERFUSION BRAIN TECHNIQUE: Multidetector CT imaging of the head and neck was performed using the standard protocol during bolus administration of intravenous contrast. Multiplanar CT image reconstructions and MIPs were obtained to evaluate the vascular anatomy. Carotid stenosis measurements (when applicable) are obtained utilizing NASCET criteria, using the distal internal carotid diameter as the denominator. Multiphase CT imaging of the brain was performed following IV bolus contrast injection. Subsequent parametric perfusion maps were calculated using RAPID software. RADIATION DOSE REDUCTION: This exam was performed according to the departmental dose-optimization program which includes automated exposure  control, adjustment of the mA and/or kV according to patient size and/or use of iterative reconstruction technique. CONTRAST:  120mL OMNIPAQUE IOHEXOL 350 MG/ML SOLN COMPARISON:  12/09/2021 CT head, no prior CTA FINDINGS: CT HEAD FINDINGS For noncontrast findings, please see same day CT head. CTA NECK FINDINGS Aortic arch: 4 vessel arch, with the origin of the left vertebral artery from the aorta. Imaged portion shows no evidence of aneurysm or dissection. No significant stenosis of the major arch vessel origins. Right carotid system: No evidence of dissection, occlusion, or hemodynamically significant stenosis (greater than 50%). Left carotid system: No evidence of dissection, occlusion, or hemodynamically significant stenosis (greater than 50%). Vertebral arteries: No evidence of dissection, occlusion, or hemodynamically significant stenosis (greater than 50%). Skeleton: No acute osseous abnormality. Other neck: Negative Upper chest: Centrilobular and paraseptal emphysema. No focal pulmonary opacity or pleural effusion. Review of the MIP images confirms the above findings CTA HEAD FINDINGS Anterior circulation: Focal cutoff of the left ICA near the terminus (series 6, image 97). The right ICA is patent to the terminus without significant stenosis. A1 segments patent, with the left A1 likely filling from retrograde flow or collaterals. The origin of the left A1 is not opacified. Normal anterior communicating artery. Anterior cerebral arteries are patent to their distal aspects. The left MCA is not definitively opacified nor are its branches opacified. The right M1 and distal MCA branches are perfused without significant stenosis. MCA branches perfused and symmetric. Posterior circulation: Vertebral arteries patent to the vertebrobasilar junction without stenosis. Posterior inferior cerebellar arteries patent proximally. Basilar patent to its distal aspect. Superior cerebellar arteries patent proximally. Patent P1  segments. PCAs perfused to their distal aspects without stenosis. The left posterior communicating artery is visualized. Venous sinuses: As permitted by contrast timing, patent. Anatomic variants: None significant. Review of the MIP images confirms the above findings CT Brain Perfusion Findings: ASPECTS: 10, although in retrospect there is some loss of definition of the insula. CBF (<30%) Volume: 43mL Perfusion (Tmax>6.0s) volume: 366mL Mismatch Volume: 214mL Infarction Location:Left frontal and lobe in the left MCA territory. IMPRESSION: 1. Occlusion of the distal left ICA (series 6, image 97), with no definite opacification of the left MCA and its branches. 2. Infarct core in the left MCA territory measures 69 mL, with penumbra measuring 359 mL with a mismatch volume of 290 mL. 3. No other hemodynamically significant stenosis in the head or neck. Code stroke imaging results were communicated on 12/09/2021 at 11:44 pm to provider ZAMMIT via telephone, who verbally acknowledged these results. Electronically Signed   By: Merilyn Baba M.D.   On: 12/09/2021 23:54   CT HEAD CODE STROKE WO CONTRAST  Result Date: 12/09/2021 CLINICAL DATA:  Code stroke.  Sudden onset weakness EXAM: CT HEAD WITHOUT CONTRAST TECHNIQUE: Contiguous axial images were obtained from the base of the skull through the vertex without intravenous contrast. RADIATION DOSE REDUCTION: This exam was performed according to the departmental dose-optimization program which includes automated exposure control, adjustment of the mA and/or kV according to patient size and/or use of iterative reconstruction technique. COMPARISON:  None Available. FINDINGS: Brain: No evidence of acute infarction, hemorrhage, cerebral edema, mass, mass effect, or midline shift. No hydrocephalus or extra-axial collection. Vascular: No definite hyperdense vessel. Skull: Negative for fracture or focal lesion. Sinuses/Orbits: No acute finding. Other: The mastoid air cells are  well aerated. ASPECTS [facility] Stroke Program Early CT Score) - Ganglionic level infarction (caudate, lentiform nuclei, internal capsule, insula, M1-M3 cortex): 7 - Supraganglionic infarction (M4-M6 cortex): 3 Total score (0-10 with 10 being normal): 10 IMPRESSION: 1. No acute intracranial process. 2. ASPECTS is 10 Code stroke imaging results were communicated on 12/09/2021 at 10:44 pm to provider ZAMMIT via telephone, who verbally acknowledged these results. Electronically Signed   By: Wiliam Ke M.D.   On: 12/09/2021 22:46    PHYSICAL EXAM  Temp:  [97.8 F (36.6 C)-99.5 F (37.5 C)] 97.9 F (36.6 C) (08/16 1145) Pulse Rate:  [49-114] 69 (08/16 1215) Resp:  [0-32] 20 (08/16 1215) BP: (98-170)/(69-115) 123/76 (08/16 1215) SpO2:  [93 %-100 %] 98 % (08/16 1215) FiO2 (%):  [30 %-100 %] 32 % (08/16 0938) Weight:  [90.7 kg] 90.7 kg (08/15 2232)  General - Well nourished, well developed, middle-aged African-American male in no apparent distress. Cardiovascular - Regular rhythm and rate.  NEURO: Patient is awake alert and interactive.  Level of arousal and orientation to time, place, and person were intact. Dysarthria with mild nonfluent aphasia. Follows commands. Has Left gaze preference can not cross midline with right side facial droop.Right arm flaccid right leg antigravity strength but weak decreased sensation on right side.  Normal strength and sensation on the left.  ASSESSMENT/PLAN Terry Mayo is a 66 y.o. male with history of hypertension, hyperlipidemia, former smoking, BMI 25.67, recent NSTEMI s/p cath 12/04/2021 (medical management).  He was doing well after discharge until he developed acute onset right-sided weakness and dysarthria for which he presented to the ED for evaluation. Received IV TNK. To IR with Dr. Joana Reamer Rodrgiuez for Left ICA terminus occulsion with TICI 3  Stroke: Acute Left MCA ischemic infarct  s/p IV TNK and thrombectomy of left ICA terminus occlusion with  TICI 3 revascularization by Dr. Joana Reamer Rodrgiuez Etiology:  cardi embolic source to be determined Code Stroke CT head No acute abnormality. ASPECTS 10.    CTA head & neck with CT perfusion- 1. Occlusion of the distal left ICA (series 6, image 97), with no definite opacification of the left MCA and its branches. 2. Infarct core in the left MCA territory measures 69 mL, with penumbra measuring 359 mL with a mismatch volume of 290 mL. 3. No other hemodynamically significant stenosis in the head or neck. Cerebral angio Left internal carotid artery angiogram showed recanalization of the left ICA terminus with patent but stenotic proximal left M1/MCA related to residual thrombus with slow distal flow (TICI 2C Post IR CT -1. Successful mechanical thrombectomy for treatment of a left ICA terminus occlusion with direct contact aspiration achieving complete recanalization (TICI 3). 2. No thromboembolic or hemorrhagic complication MRI  pending  2D Echo EF 45-50% LDL 41 HgbA1c 5.5  VTE prophylaxis - SCD's    Diet   Diet NPO time specified   aspirin 81 mg daily and clopidogrel 75 mg daily prior to admission, now on No antithrombotic.  Therapy recommendations:  pending Disposition:  pending   Hypertension Home meds:  cozaar, imdur. Metoprolol Stable SBP goal 120-140 s/p IR thrombectomy for 24 hrs  Home meds on hold  Long-term BP goal normotensive  Hyperlipidemia Home meds:  atorvastatin,  held in hospital due to elevated AST/ALT LDL 41, goal < 70 High intensity statin not indicated due to below goal  Monitor liver function, restart if appropriate   Recent NSTEMI  Hold ASA/Plavix for 24 hr s/p TNK/IR  Other Stroke Risk Factors Advanced Age >/= 14  Cigarette smoker advised to stop smoking Substance abuse - UDS:  THC  Patient advised to stop using due to stroke risk. Coronary artery disease Migraines Congestive heart failure  Other Active Problems Dysphagia  Leukocytosis   Hospital day  # 0  Beulah Gandy DNP, ACNPC-AG  STROKE MD NOTE :  I have personally obtained history,examined this patient, reviewed notes, independently viewed imaging studies, participated in medical decision making and plan of care.ROS completed by me personally and pertinent positives fully documented  I have made any additions or clarifications directly to the above note. Agree with note above.  Patient presented with aphasia and right hemiparesis due to terminal left carotid occlusion and underwent treatment with IV thrombolysis followed by successful mechanical thrombectomy with TICI 3 Sterilization.  He continues to have mild aphasia, dysarthria and dense right hemiplegia.  Continue close neurological observation and strict blood pressure control as per post thrombectomy and thrombolytic protocols.  Check MRI scan of the brain later today.  Speech therapy for swallow eval.  Mobilize out of bed.  Physical Occupational Therapy.  May need loop recorder at discharge for paroxysmal A-fib unless A-fib is found during monitoring during this hospitalization.  No family available at the bedside for discussion.This patient is critically ill and at significant risk of neurological worsening, death and care requires constant monitoring of vital signs, hemodynamics,respiratory and cardiac monitoring, extensive review of multiple databases, frequent neurological assessment, discussion with family, other specialists and medical decision making of high complexity.I have made any additions or clarifications directly to the above note.This critical care time does not reflect procedure time, or teaching time or supervisory time of PA/NP/Med Resident etc but could involve care discussion time.  I spent 30 minutes of neurocritical care time  in the care of  this patient.      Antony Contras, MD Medical Director [facility] Pager: [phone] 12/10/2021 3:55 PM  To contact Stroke Continuity provider, please refer to  [email or web address] After hours, contact General Neurology

## 2021-12-10 NOTE — Progress Notes (Signed)
Code stroke  Call time  2224 Beeper time  2224 Start   2237 End   2241 Soc   2241 Epic   2241 Rad called  2242

## 2021-12-10 NOTE — Progress Notes (Signed)
2352 [facility]N called Dr Estell Harpin to get call report given to him from radiology as report is not in chart yet. 2355 [facility] called- Dr Anette Riedel spoke with Dr Iver Nestle and Dr Veverly Fells [phone] pt to transfer to [facility] for thrombectomy

## 2021-12-10 NOTE — Anesthesia Postprocedure Evaluation (Signed)
Anesthesia Post Note  Patient: Terry Mayo  Procedure(s) Performed: IR WITH ANESTHESIA     Patient location during evaluation: SICU Anesthesia Type: General Level of consciousness: sedated Pain management: pain level controlled Vital Signs Assessment: post-procedure vital signs reviewed and stable Respiratory status: patient remains intubated per anesthesia plan Cardiovascular status: stable Postop Assessment: no apparent nausea or vomiting Anesthetic complications: no   No notable events documented.  Last Vitals:  Vitals:   12/10/21 0645 12/10/21 0713  BP: 133/76 (!) 140/79  Pulse: (!) 55 (!) 56  Resp: 16 16  Temp:  36.6 C  SpO2: 100% 100%    Last Pain:  Vitals:   12/10/21 0713  TempSrc: Axillary  PainSc:                  Kennieth Rad

## 2021-12-10 NOTE — H&P (Addendum)
Neurology H&P   CC: Acute onset right-sided weakness  History is obtained from: Chart review and family at bedside, primarily wife Terry Mayo  HPI: Terry Mayo is a 66 y.o. male with a past medical history significant for hypertension, hyperlipidemia, former smoking, BMI 25.67, recent NSTEMI s/p cath 12/04/2021 (medical management).  He was doing well after discharge until he developed acute onset right-sided weakness and dysarthria for which he presented to the ED for evaluation.  He was initially evaluated by telespecialists who administered TNK and then discussed potential thrombectomy with myself.  I obtained consent from the patient's wife via telephone for the procedure reviewing risks and benefits and answering her questions.  She confirms that he had been doing well postdischarge, but had been having headaches for the past couple of weeks (his typical migraines, but these had been resolved for the last few years).  She had stepped away to use the bathroom, and found him 5 to 10 minutes later with severely slurred speech and right-sided weakness as well as some confusion.  She notes his weight is very variable but denies any other health concerns  LKW: 2150 Per telemetry specialists evaluation TNK given?:  2309 IA performed?:  Yes Premorbid modified rankin scale:      0 - No symptoms.  ROS: Unable to obtain due to altered mental status and patient being taken for emergent procedure.   Past Medical History:  Diagnosis Date   Arthritis    Essential hypertension    Hyperlipidemia    Kidney stones    Past Surgical History:  Procedure Laterality Date   CHOLECYSTECTOMY  1990s   COLONOSCOPY WITH PROPOFOL N/A 09/30/2015   Procedure: COLONOSCOPY WITH PROPOFOL;  Surgeon: Daneil Dolin, MD;  [facility];  Service: Endoscopy;  Laterality: N/A;  [place]  2005   LEFT HEART CATH AND CORONARY ANGIOGRAPHY N/A 12/04/2021   Procedure: LEFT HEART CATH AND  CORONARY ANGIOGRAPHY;  Surgeon: Belva Crome, MD;  Location: [facility];  Service: Cardiovascular;  Laterality: N/A;   Current Outpatient Medications  Medication Instructions   acetaminophen (TYLENOL) 650 mg, Oral, Every 8 hours PRN   aspirin EC 81 mg, Oral, Daily, Swallow whole.   atorvastatin (LIPITOR) 40 mg, Oral, Daily   clopidogrel (PLAVIX) 75 mg, Oral, Daily with breakfast   isosorbide mononitrate (IMDUR) 15 mg, Oral, Daily   losartan (COZAAR) 25 mg, Oral, Daily   metoprolol succinate (TOPROL-XL) 12.5 mg, Oral, Daily   nitroGLYCERIN (NITROSTAT) 0.4 mg, Sublingual, Every 5 min x3 PRN   spironolactone (ALDACTONE) 12.5 mg, Oral, Daily   Family History  Problem Relation Age of Onset   Coronary artery disease Sister    Colon cancer Neg Hx    Social History:  reports that he quit smoking about 6 years ago. His smoking use included cigarettes. He has never used smokeless tobacco. He reports that he does not currently use alcohol. He reports that he does not use drugs.  Exam: Current vital signs: BP (!) 142/115   Pulse 72   Temp 98.6 F (37 C)   Resp (!) 27   Ht 6\' 2"  (1.88 m)   Wt 90.7 kg   SpO2 97%   BMI 25.67 kg/m  Vital signs in last 24 hours: Temp:  [98.6 F (37 C)-99.5 F (37.5 C)] 98.6 F (37 C) (08/16 0000) Pulse Rate:  [68-85] 72 (08/16 0000) Resp:  [18-29] 27 (08/16 0000) BP: (142-170)/(80-115) 142/115 (08/16 0000)  SpO2:  [97 %-100 %] 97 % (08/16 0000) Weight:  [90.7 kg] 90.7 kg (08/15 2232)   Physical Exam -- Limited en route to IR Constitutional: Appears well-developed and well-nourished.  Psych: Calm and cooperative  Eyes: No scleral injection HENT: No oropharyngeal obstruction. Right temporal skin tear MSK: no joint deformities.  Respiratory: Effort normal, non-labored breathing GI: Soft.  No distension.  Neuro: Mental Status: Patient is awake, alert, oriented to person, place, month, year, and situation. Mild aphasia; some neglect of the  right side Cranial Nerves: II: Visual Fields are notable for right hemianopia.  III,IV, VI: Left gaze, can approach to midline but does not cross VII: Facial movement is notable for a marked right droop.  VIII: hearing is intact to voice XII: tongue is midline without atrophy or fasciculations.  Motor: Tone is normal. Bulk is normal. No movement of the RUE. RLE drifts without hitting the bed Sensory: No sensation of the RUE  NIHSS total 14 --> 16 on arrival to [facility] 1A: Level of Consciousness - Alert; keenly responsive + 0 1B: Ask Month and Age - Both Questions Right + 0 1C: Blink Eyes & Squeeze Hands - Performs Both Tasks + 0 2: Test Horizontal Extraocular Movements - Partial Gaze Palsy: Corrects with Oculocephalic Reflex + 1 3: Test Visual Fields - No Visual Loss + 0 [2 on my eval] 4: Test Facial Palsy (Use Grimace if Obtunded) - Partial paralysis (lower face) + 2 5A: Test Left Arm Motor Drift - No Drift for 10 Seconds + 0 5B: Test Right Arm Motor Drift - No Movement + 4 6A: Test Left Leg Motor Drift - No Drift for 5 Seconds + 0 6B: Test Right Leg Motor Drift - Some Effort Against Gravity + 2 7: Test Limb Ataxia (FNF/Heel-Shin) - Does Not Understand + 0 8: Test Sensation - Mild-Moderate Loss: Less Sharp/More Dull + 1 9: Test Language/Aphasia - Mild-Moderate Aphasia: Some Obvious Changes, Without Significant Limitation + 1 10: Test Dysarthria - Severe Dysarthria: Unintelligble Slurring or Out of Proportion to Aphasia + 2 11: Test Extinction/Inattention - Visual/tactile/auditory/spatial/personal inattention + 1  Per telespecialist Dr. Ria Bush; confirmed deficits but did not complete full eval prior to IR thrombectomy; for me he did also have right hemianopia likely missed on video eval   I have reviewed labs in epic and the results pertinent to this consultation are:  Basic Metabolic Panel: Recent Labs  Lab 12/04/21 0340 12/04/21 1614 12/05/21 0317 12/06/21 0241 12/06/21 0732  12/09/21 2235  NA 139  --  136 137 135 138  K 3.7  --  4.0 4.0 3.9 3.7  CL 108  --  109 105 105 106  CO2 23  --  21* 23 24 22   GLUCOSE 141*  --  129* 108* 134* 124*  BUN 12  --  7* 9 11 20   CREATININE 1.02 0.76 0.84 1.01 1.14 1.21  CALCIUM 8.9  --  8.4* 8.8* 8.7* 8.7*  MG  --   --  1.9  --   --   --     CBC: Recent Labs  Lab 12/04/21 0340 12/04/21 1614 12/05/21 0317 12/09/21 2235  WBC 10.0 10.8* 12.5* 11.1*  NEUTROABS  --   --   --  4.6  HGB 16.4 12.1* 14.2 15.8  HCT 47.1 34.0* 39.9 45.0  MCV 92.9 92.9 93.0 93.2  PLT 178 138* 151 196    Coagulation Studies: Recent Labs    12/09/21 2235  LABPROT 14.0  INR  1.1    Lab Results  Component Value Date   CHOL 99 12/05/2021   HDL 50 12/05/2021   LDLCALC 41 12/05/2021   TRIG 41 12/05/2021   CHOLHDL 2.0 12/05/2021    Lab Results  Component Value Date   HGBA1C 5.5 12/05/2021      I have reviewed the images obtained:  Head CT personally reviewed, agree with radiology:   1. No acute intracranial process. 2. ASPECTS is 10  CTA/CTP personally reviewed, agree with radiology:   1. Occlusion of the distal left ICA (series 6, image 97), with no definite opacification of the left MCA and its branches. 2. Infarct core in the left MCA territory measures 69 mL, with penumbra measuring 359 mL with a mismatch volume of 290 mL. 3. No other hemodynamically significant stenosis in the head or neck.     "Cath on 12/04/21 with ambiguous of coronary anatomy with a multiple diagonals as ramus light branches.  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 There was also a faint end of thrombus distal or around the origin of the large septal perforator.  There was moderate diffuse disease of the RCA and a large OM branch. Medical therapy was recommended."  Echocardiogram 12/04/2021:  1. False tendon in the apical LV of no clinical significance. Left  ventricular ejection fraction, by  estimation, is 45 to 50%. The left  ventricle has mildly decreased function. The left ventricle demonstrates  regional wall motion abnormalities (see  scoring diagram/findings for description). Left ventricular diastolic  parameters are indeterminate. There is akinesis of the left ventricular,  apical septal wall and anterior wall. There is hypokinesis of the left  ventricular, mid anterior wall. There is   akinesis of the left ventricular, apical segment. There is akinesis of  the left ventricular, mid anteroseptal wall.   2. Right ventricular systolic function is normal. The right ventricular  size is normal. A Prominent moderator band is visualized. There is normal  pulmonary artery systolic pressure. The estimated right ventricular  systolic pressure is 33.4 mmHg.   3. The mitral valve is normal in structure. Trivial mitral valve  regurgitation. No evidence of mitral stenosis.   4. The aortic valve is normal in structure. Aortic valve regurgitation is  not visualized. No aortic stenosis is present.   5. The inferior vena cava is dilated in size with >50% respiratory  variability, suggesting right atrial pressure of 8 mmHg.   Impression: Likely cardioembolic stroke versus embolic of undetermined source  Recommendations: #Left internal carotid artery stroke - No need to repeat stroke labs given recent A1c and lipid panel, both A1c and LDL are at goal - MRI brain 24 hours post TNK (8/16 ~2300) - Frequent neuro checks - Echocardiogram, consider TEE to more sensitively exclude intracardiac thrombus if TTE is negative - Hold antiplatelets for now due to TNK - Risk factor modification - Telemetry monitoring; 30 day event monitor on discharge if no arrythmias captured  - Blood pressure goal   - Post successful uncomplicated revascularization SBP 120 - 140 for 24 hours - PT consult, OT consult, Speech consult - Admitted to stroke team  # Recent NSTEMI - Cardiology consult in the  morning - Hold antiplatelets for now  # HTN - Titrate oral meds after extubation   # HLD - Continue atorva 40 mg, LDL at goal   # Intubated for procedure, left intubated due to epistaxis - Appreciate CCM management  #  THC use - Cessation counseling  Brooke Dare MD-PhD [facility] [phone] Available 7 PM to 7 AM, outside of these hours please call Neurologist on call as listed on Amion.   Total critical care time: 60 minutes   Critical care time was exclusive of separately billable procedures and treating other patients.   Critical care was necessary to treat or prevent imminent or life-threatening deterioration.   Critical care was time spent personally by me on the following activities: development of treatment plan with patient and/or surrogate as well as nursing, discussions with consultants/primary team, evaluation of patient's response to treatment, examination of patient, obtaining history from patient or surrogate, ordering and performing treatments and interventions, ordering and review of laboratory studies, ordering and review of radiographic studies, and re-evaluation of patient's condition as needed, as documented above.

## 2021-12-10 NOTE — Anesthesia Preprocedure Evaluation (Signed)
Anesthesia Evaluation  Patient identified by MRN, date of birth, ID band Patient awake    Reviewed: Patient's Chart, lab work & pertinent test resultsPreop documentation limited or incomplete due to emergent nature of procedure.  Airway Mallampati: II  TM Distance: >3 FB Neck ROM: Full    Dental  (+) Dental Advisory Given   Pulmonary former smoker,    breath sounds clear to auscultation       Cardiovascular hypertension, Pt. on medications and Pt. on home beta blockers + CAD and + Past MI   Rhythm:Regular Rate:Normal     Neuro/Psych CVA    GI/Hepatic negative GI ROS, Neg liver ROS,   Endo/Other  negative endocrine ROS  Renal/GU Renal disease     Musculoskeletal  (+) Arthritis ,   Abdominal   Peds  Hematology negative hematology ROS (+)   Anesthesia Other Findings   Reproductive/Obstetrics                             Anesthesia Physical Anesthesia Plan  ASA: 4 and emergent  Anesthesia Plan: General   Post-op Pain Management:    Induction: Intravenous and Rapid sequence  PONV Risk Score and Plan: 2 and Dexamethasone, Ondansetron and Treatment may vary due to age or medical condition  Airway Management Planned: Oral ETT  Additional Equipment:   Intra-op Plan:   Post-operative Plan: Possible Post-op intubation/ventilation  Informed Consent:     Only emergency history available  Plan Discussed with: CRNA  Anesthesia Plan Comments:         Anesthesia Quick Evaluation

## 2021-12-10 NOTE — Anesthesia Procedure Notes (Signed)
Procedure Name: Intubation Date/Time: 12/10/2021 1:30 AM  Performed by: Nori Poland T, CRNAPre-anesthesia Checklist: Patient identified, Emergency Drugs available, Suction available and Patient being monitored Patient Re-evaluated:Patient Re-evaluated prior to induction Oxygen Delivery Method: Circle system utilized Preoxygenation: Pre-oxygenation with 100% oxygen Induction Type: IV induction, Rapid sequence and Cricoid Pressure applied Ventilation: Mask ventilation without difficulty Laryngoscope Size: Miller and 3 Grade View: Grade I Tube type: Oral Tube size: 7.5 mm Number of attempts: 1 Airway Equipment and Method: Stylet and Oral airway Placement Confirmation: ETT inserted through vocal cords under direct vision, positive ETCO2 and breath sounds checked- equal and bilateral Secured at: 23 cm Tube secured with: Tape Dental Injury: Teeth and Oropharynx as per pre-operative assessment  Comments: Bleeding noted in oral cavity prior to induction

## 2021-12-10 NOTE — ED Notes (Signed)
[facility] present to transport pt to [facility]

## 2021-12-10 NOTE — Consult Note (Signed)
NAME:  Terry Mayo, MRN:  [id number], DOB:  Dec 18, 1955, LOS: 0 ADMISSION DATE:  12/09/2021, CONSULTATION DATE:  12/10/21 REFERRING MD:  Tommie Sams, CHIEF COMPLAINT:  right sided weakness and trouble speaking   History of Present Illness:  66yM with history of of ICM (EF 45-50%)with recent admission 12/04/21 for NSTEMI found to have multivessel CAD discharged on ASA/plavix, HTN, HLD who presented to ED for abrupt onset of right sided weakness and dysarthria. He was found to have suspected left M1 occlusion, given TNK and transferred to [facility] for thrombectomy. IR cerebral angiogram revealed L ICA terminus occlusion, post-thrombectomy with TICI 3 revascularization. He was noted to have epistaxis following the procedure and plan is to remain intubated until he is able to sit up. He is to remain on bed rest, flat for 6h post thrombectomy with SBP goal 120-140.    Pertinent  Medical History  ICM HTN HLD  Significant Hospital Events: Including procedures, antibiotic start and stop dates in addition to other pertinent events   12/10/21 admitted found to have L ICA terminus occlusion s/p TNK and IR thrombectomy  Interim History / Subjective:  N/a  Objective   Blood pressure (!) 153/92, pulse 78, temperature 98.6 F (37 C), resp. rate 19, height 6\' 2"  (1.88 m), weight 90.7 kg, SpO2 94 %.        Intake/Output Summary (Last 24 hours) at 12/10/2021 0230 Last data filed at 12/10/2021 0211 Gross per 24 hour  Intake 500 ml  Output 20 ml  Net 480 ml   Filed Weights   12/09/21 2232  Weight: 90.7 kg    Examination: General appearance: 66 y.o., male, intubated, sedated Eyes: pupils pinpoint, sluggishly reactive HENT: NCAT; MMM Neck: Trachea midline; no lymphadenopathy, no JVD Lungs: mech breath sounds bl, equal chest rise CV: RRR, no murmur  Abdomen: Soft, non-tender; non-distended, BS hypoactive Extremities: No peripheral edema, warm Neuro:  localizes RUE, moves RUE  spontaneously   Resolved Hospital Problem list    Assessment & Plan:   # Left MCA infarct s/p TNK and thrombectomy - neuro following - CTH 24h post thrombolytic - no antiPLT/AC for first 24h post TNK - neo for SBP goal 120-140 per neuro IR - keep intubated, flat for 6h - bedside swallow, stroke pathway  # Post-op vent management - f/u CXR - full vent support, PAD protocol fentanyl pushes, propofol for RASS -1 - WUA/SBT and hopefully ready for extubation later today if epistaxis has improved  # Epistaxis Some bloody secretions in yankauer but doesn't seem active currently.  - ctm, afrin and hold pressure if recurrent, consider involvement of ENT if persistent  # CAD with recent NSTEMI # HTN - holding ASA/plavix for now as above - assess LFT trend before resumption of atorvastatin - hold metoprolol, imdur restart if BP room later  - hold losartan for now   # transaminitis - trend LFTs  Best Practice (right click and "Reselect all SmartList Selections" daily)   Per primary  Labs   CBC: Recent Labs  Lab 12/04/21 0340 12/04/21 1614 12/05/21 0317 12/09/21 2235  WBC 10.0 10.8* 12.5* 11.1*  NEUTROABS  --   --   --  4.6  HGB 16.4 12.1* 14.2 15.8  HCT 47.1 34.0* 39.9 45.0  MCV 92.9 92.9 93.0 93.2  PLT 178 138* 151 196    Basic Metabolic Panel: Recent Labs  Lab 12/04/21 0340 12/04/21 1614 12/05/21 0317 12/06/21 0241 12/06/21 0732 12/09/21 2235  NA 139  --  136 137 135 138  K 3.7  --  4.0 4.0 3.9 3.7  CL 108  --  109 105 105 106  CO2 23  --  21* 23 24 22   GLUCOSE 141*  --  129* 108* 134* 124*  BUN 12  --  7* 9 11 20   CREATININE 1.02 0.76 0.84 1.01 1.14 1.21  CALCIUM 8.9  --  8.4* 8.8* 8.7* 8.7*  MG  --   --  1.9  --   --   --    GFR: Estimated Creatinine Clearance: 68.9 mL/min (by C-G formula based on SCr of 1.21 mg/dL). Recent Labs  Lab 12/04/21 0340 12/04/21 1614 12/05/21 0317 12/09/21 2235  WBC 10.0 10.8* 12.5* 11.1*    Liver Function  Tests: Recent Labs  Lab 12/04/21 0340 12/09/21 2235  AST 38 63*  ALT 28 85*  ALKPHOS 84 89  BILITOT 1.0 0.7  PROT 7.6 7.7  ALBUMIN 4.1 4.0   No results for input(s): "LIPASE", "AMYLASE" in the last 168 hours. No results for input(s): "AMMONIA" in the last 168 hours.  ABG No results found for: "PHART", "PCO2ART", "PO2ART", "HCO3", "TCO2", "ACIDBASEDEF", "O2SAT"   Coagulation Profile: Recent Labs  Lab 12/09/21 2235  INR 1.1    Cardiac Enzymes: No results for input(s): "CKTOTAL", "CKMB", "CKMBINDEX", "TROPONINI" in the last 168 hours.  HbA1C: Hgb A1c MFr Bld  Date/Time Value Ref Range Status  12/05/2021 03:17 AM 5.5 4.8 - 5.6 % Final    Comment:    (NOTE) Pre diabetes:          5.7%-6.4%  Diabetes:              >6.4%  Glycemic control for   <7.0% adults with diabetes     CBG: Recent Labs  Lab 12/09/21 2234  GLUCAP 128*    Review of Systems:   Unable to obtain, intubated/sedated  Past Medical History:  He,  has a past medical history of Arthritis, Essential hypertension, Hyperlipidemia, and Kidney stones.   Surgical History:   Past Surgical History:  Procedure Laterality Date   CHOLECYSTECTOMY  1990s   COLONOSCOPY WITH PROPOFOL N/A 09/30/2015   Procedure: COLONOSCOPY WITH PROPOFOL;  Surgeon: Daneil Dolin, MD;  [facility];  Service: Endoscopy;  Laterality: N/A;  [place]  2005   LEFT HEART CATH AND CORONARY ANGIOGRAPHY N/A 12/04/2021   Procedure: LEFT HEART CATH AND CORONARY ANGIOGRAPHY;  Surgeon: Belva Crome, MD;  Location: [place];  Service: Cardiovascular;  Laterality: N/A;     Social History:   reports that he quit smoking about 6 years ago. His smoking use included cigarettes. He has never used smokeless tobacco. He reports that he does not currently use alcohol. He reports that he does not use drugs.   Family History:  His family history includes Coronary artery disease in his sister. There is no  history of Colon cancer.   Allergies Allergies  Allergen Reactions   Penicillins     REACTION UNKNOWN Has patient had a PCN reaction causing immediate rash, facial/tongue/throat swelling, SOB or lightheadedness with hypotension: UNKNOWN Has patient had a PCN reaction causing severe rash involving mucus membranes or skin necrosis: UNKNOWN Has patient had a PCN reaction that required hospitalization UNKNOWN Has patient had a PCN reaction occurring within the last 10 years: UNKNOWN If all of the above answers are "NO", then may proceed with Cephalosporin use.      Home Medications  Prior to Admission medications   Medication Sig Start Date End Date Taking? Authorizing Provider  acetaminophen (TYLENOL) 650 MG CR tablet Take 650 mg by mouth every 8 (eight) hours as needed for pain.    [provider]  aspirin EC 81 MG tablet Take 1 tablet (81 mg total) by mouth daily. Swallow whole. 12/07/21   Cyndi Bender, NP  atorvastatin (LIPITOR) 40 MG tablet Take 40 mg by mouth daily. 11/09/21   [provider]  clopidogrel (PLAVIX) 75 MG tablet Take 1 tablet (75 mg total) by mouth daily with breakfast. 12/07/21   Cyndi Bender, NP  isosorbide mononitrate (IMDUR) 30 MG 24 hr tablet Take 0.5 tablets (15 mg total) by mouth daily. 12/07/21   Cyndi Bender, NP  losartan (COZAAR) 25 MG tablet Take 25 mg by mouth daily. 11/25/21   [provider]  metoprolol succinate (TOPROL-XL) 25 MG 24 hr tablet Take 0.5 tablets (12.5 mg total) by mouth daily. 12/07/21   Cyndi Bender, NP  nitroGLYCERIN (NITROSTAT) 0.4 MG SL tablet Place 1 tablet (0.4 mg total) under the tongue every 5 (five) minutes x 3 doses as needed for chest pain. 12/06/21   Cyndi Bender, NP  spironolactone (ALDACTONE) 25 MG tablet Take 0.5 tablets (12.5 mg total) by mouth daily. 12/07/21   Cyndi Bender, NP     Critical care time: 35 minutes

## 2021-12-10 NOTE — Progress Notes (Signed)
An USGPIV (ultrasound guided PIV) has been placed for short-term vasopressor infusion. A correctly placed ivWatch must be used when administering Vasopressors. Should this treatment be needed beyond 72 hours, central line access should be obtained.  It will be the responsibility of the bedside nurse to follow best practice to prevent extravasations.   ?

## 2021-12-11 ENCOUNTER — Encounter (HOSPITAL_COMMUNITY): Payer: Self-pay | Admitting: Interventional Radiology

## 2021-12-11 ENCOUNTER — Inpatient Hospital Stay (HOSPITAL_COMMUNITY): Payer: Medicare HMO

## 2021-12-11 DIAGNOSIS — Z8673 Personal history of transient ischemic attack (TIA), and cerebral infarction without residual deficits: Secondary | ICD-10-CM | POA: Diagnosis not present

## 2021-12-11 LAB — CBC
HCT: 45.2 % (ref 39.0–52.0)
Hemoglobin: 16 g/dL (ref 13.0–17.0)
MCH: 32.8 pg (ref 26.0–34.0)
MCHC: 35.4 g/dL (ref 30.0–36.0)
MCV: 92.6 fL (ref 80.0–100.0)
Platelets: 176 10*3/uL (ref 150–400)
RBC: 4.88 MIL/uL (ref 4.22–5.81)
RDW: 14.1 % (ref 11.5–15.5)
WBC: 17 10*3/uL — ABNORMAL HIGH (ref 4.0–10.5)
nRBC: 0 % (ref 0.0–0.2)

## 2021-12-11 LAB — BASIC METABOLIC PANEL
Anion gap: 10 (ref 5–15)
BUN: 18 mg/dL (ref 8–23)
CO2: 22 mmol/L (ref 22–32)
Calcium: 8.6 mg/dL — ABNORMAL LOW (ref 8.9–10.3)
Chloride: 109 mmol/L (ref 98–111)
Creatinine, Ser: 1.52 mg/dL — ABNORMAL HIGH (ref 0.61–1.24)
GFR, Estimated: 50 mL/min — ABNORMAL LOW (ref >60)
Glucose, Bld: 116 mg/dL — ABNORMAL HIGH (ref 70–99)
Potassium: 3.9 mmol/L (ref 3.5–5.1)
Sodium: 141 mmol/L (ref 135–145)

## 2021-12-11 LAB — GLUCOSE, CAPILLARY
Glucose-Capillary: 111 mg/dL — ABNORMAL HIGH (ref 70–99)
Glucose-Capillary: 118 mg/dL — ABNORMAL HIGH (ref 70–99)
Glucose-Capillary: 122 mg/dL — ABNORMAL HIGH (ref 70–99)
Glucose-Capillary: 125 mg/dL — ABNORMAL HIGH (ref 70–99)
Glucose-Capillary: 126 mg/dL — ABNORMAL HIGH (ref 70–99)
Glucose-Capillary: 127 mg/dL — ABNORMAL HIGH (ref 70–99)

## 2021-12-11 LAB — TRIGLYCERIDES: Triglycerides: 58 mg/dL (ref <150)

## 2021-12-11 MED ORDER — ASPIRIN 81 MG PO CHEW: 81.0000 mg | CHEWABLE_TABLET | Freq: Every day | ORAL | Status: DC

## 2021-12-11 MED ORDER — ORAL CARE MOUTH RINSE: 15.0000 mL | OROMUCOSAL | Status: DC | PRN

## 2021-12-11 MED ORDER — ASPIRIN 300 MG RE SUPP
300.0000 mg | Freq: Once | RECTAL | Status: DC
  Filled 2021-12-11: qty 1

## 2021-12-11 MED ORDER — ASPIRIN 300 MG RE SUPP
300.0000 mg | Freq: Every day | RECTAL | Status: DC
Start: 2021-12-11 — End: 2021-12-11
  Filled 2021-12-11: qty 1

## 2021-12-11 MED ORDER — CLOPIDOGREL BISULFATE 75 MG PO TABS: 75.0000 mg | ORAL_TABLET | Freq: Every day | ORAL | Status: DC

## 2021-12-11 MED ORDER — CLOPIDOGREL BISULFATE 75 MG PO TABS
75.0000 mg | ORAL_TABLET | Freq: Every day | ORAL | Status: DC
  Administered 2021-12-11 – 2021-12-16 (×6): 75 mg via ORAL
  Filled 2021-12-11 (×6): qty 1

## 2021-12-11 MED ORDER — ASPIRIN 81 MG PO CHEW
81.0000 mg | CHEWABLE_TABLET | Freq: Every day | ORAL | Status: DC
Start: 2021-12-11 — End: 2021-12-16
  Administered 2021-12-11 – 2021-12-16 (×6): 81 mg via ORAL
  Filled 2021-12-11 (×6): qty 1

## 2021-12-11 NOTE — Progress Notes (Addendum)
STROKE TEAM PROGRESS NOTE     INTERVAL HISTORY His wife is at the bedside.  Awake and alert following commands, oriented x 4, still with dysarthria but improved from yesterday, mild aphasia. Left gaze preference, can not cross midline, right hemianopia, Right arm with some movement can lift right leg against gravity, decrease sensation on right. Left side normal strength.   Leukocytosis worsening to 17, afebrile. CXR with no cute process. Cr 1.52 will continue IVF  MRI brain with Large area of acute/subacute ischemia within the left MCA territory  ASA and Plavix started today. Still NPO may need tube placed   Wife updated by Dr Pearlean Brownie at the bedside.   Vitals:   12/11/21 0400 12/11/21 0500 12/11/21 0600 12/11/21 0722  BP: 133/80 130/75 137/71   Pulse: 61 69 64   Resp: 20 (!) 24 (!) 23   Temp:    98.8 F (37.1 C)  TempSrc:    Oral  SpO2: 97% 97% 97%   Weight:      Height:       CBC:  Recent Labs  Lab 12/09/21 2235 12/10/21 0350 12/11/21 0612  WBC 11.1*  --  17.0*  NEUTROABS 4.6  --   --   HGB 15.8 13.3 16.0  HCT 45.0 39.0 45.2  MCV 93.2  --  92.6  PLT 196  --  176    Basic Metabolic Panel:  Recent Labs  Lab 12/05/21 0317 12/06/21 0241 12/09/21 2235 12/10/21 0350 12/11/21 0612  NA 136   < > 138 137 141  K 4.0   < > 3.7 4.3 3.9  CL 109   < > 106  --  109  CO2 21*   < > 22  --  22  GLUCOSE 129*   < > 124*  --  116*  BUN 7*   < > 20  --  18  CREATININE 0.84   < > 1.21  --  1.52*  CALCIUM 8.4*   < > 8.7*  --  8.6*  MG 1.9  --   --   --   --    < > = values in this interval not displayed.    Lipid Panel:  Recent Labs  Lab 12/05/21 0317 12/11/21 0612  CHOL 99  --   TRIG 41 58  HDL 50  --   CHOLHDL 2.0  --   VLDL 8  --   LDLCALC 41  --     HgbA1c:  Recent Labs  Lab 12/05/21 0317  HGBA1C 5.5    Urine Drug Screen:  Recent Labs  Lab 12/10/21 0916  LABOPIA NONE DETECTED  COCAINSCRNUR NONE DETECTED  LABBENZ NONE DETECTED  AMPHETMU NONE DETECTED   THCU POSITIVE*  LABBARB NONE DETECTED    Alcohol Level  Recent Labs  Lab 12/09/21 2235  ETH <10     IMAGING past 24 hours MR BRAIN WO CONTRAST  Result Date: 12/11/2021 CLINICAL DATA:  Status post TNK EXAM: MRI HEAD WITHOUT CONTRAST TECHNIQUE: Multiplanar, multiecho pulse sequences of the brain and surrounding structures were obtained without intravenous contrast. COMPARISON:  None Available. FINDINGS: Brain: Large area of acute/subacute ischemia within the left MCA territory. No acute or chronic hemorrhage. Normal white matter signal, parenchymal volume and CSF spaces. The midline structures are normal. Vascular: Major flow voids are preserved. Skull and upper cervical spine: Normal calvarium and skull base. Visualized upper cervical spine and soft tissues are normal. Sinuses/Orbits:No paranasal sinus fluid levels or advanced mucosal thickening.  No mastoid or middle ear effusion. Normal orbits. IMPRESSION: Large area of acute/subacute ischemia within the left MCA territory. No hemorrhage or mass effect. Electronically Signed   By: Deatra Robinson M.D.   On: 12/11/2021 01:10   ECHOCARDIOGRAM LIMITED  Result Date: 12/10/2021    ECHOCARDIOGRAM LIMITED REPORT   Patient Name:   Terry Mayo Date of Exam: 12/10/2021 Medical Rec #:  [id number]    Height:       74.0 in Accession #:    [id number]   Weight:       200.0 lb Date of Birth:  [birth year]    BSA:          2.173 m Patient Age:    49 years     BP:           131/85 mmHg Patient Gender: M            HR:           85 bpm. Exam Location:  Inpatient Procedure: Limited Echo, Color Doppler and Cardiac Doppler                                 MODIFIED REPORT: This report was modified by Thomasene Ripple DO on 12/10/2021 due to document accurate                                      LVEF.  Indications:     Stroke i63.9  History:         Patient has prior history of Echocardiogram examinations, most                  recent 12/05/2021. CAD; Risk Factors:Hypertension and                   Dyslipidemia.  Sonographer:     Irving Burton Senior RDCS Sonographer#2:   Gaynell Face RDCS Referring Phys:  [id number] Karmen Bongo BHAGAT Diagnosing Phys: Thomasene Ripple DO  Sonographer Comments: Limited for stroke IMPRESSIONS  1. Left ventricular ejection fraction, by estimation, is 45 to 50%. The left ventricle has mildly decreased function. Left ventricular endocardial border not optimally defined to evaluate regional wall motion. Left ventricular diastolic function could not be evaluated.  2. The mitral valve is grossly normal. Trivial mitral valve regurgitation. No evidence of mitral stenosis.  3. The aortic valve was not well visualized. Aortic valve regurgitation is not visualized. No aortic stenosis is present.  4. The inferior vena cava is normal in size with greater than 50% respiratory variability, suggesting right atrial pressure of 3 mmHg. FINDINGS  Left Ventricle: Left ventricular ejection fraction, by estimation, is 45 to 50%. The left ventricle has mildly decreased function. Left ventricular endocardial border not optimally defined to evaluate regional wall motion. Left ventricular diastolic function could not be evaluated. Pericardium: Presence of epicardial fat layer. Mitral Valve: The mitral valve is grossly normal. Trivial mitral valve regurgitation. No evidence of mitral valve stenosis. Tricuspid Valve: The tricuspid valve is not well visualized. Tricuspid valve regurgitation is not demonstrated. Aortic Valve: The aortic valve was not well visualized. Aortic valve regurgitation is not visualized. No aortic stenosis is present. Aorta: The aortic root was not well visualized. Venous: The inferior vena cava is normal in size with greater than 50% respiratory variability, suggesting right atrial pressure of 3 mmHg. IAS/Shunts:  No atrial level shunt detected by color flow Doppler.  LV Volumes (MOD) LV vol d, MOD A2C: 79.6 ml LV vol d, MOD A4C: 116.0 ml LV vol s, MOD A2C: 43.6 ml LV vol s, MOD A4C:  47.1 ml LV SV MOD A2C:     36.0 ml LV SV MOD A4C:     116.0 ml LV SV MOD BP:      50.0 ml Kardie Tobb DO Electronically signed by Berniece Salines DO Signature Date/Time: 12/10/2021/12:36:27 PM    Final (Updated)     PHYSICAL EXAM  Temp:  [97.9 F (36.6 C)-98.9 F (37.2 C)] 98.8 F (37.1 C) (08/17 0722) Pulse Rate:  [49-114] 64 (08/17 0600) Resp:  [0-32] 23 (08/17 0600) BP: (113-169)/(70-91) 137/71 (08/17 0600) SpO2:  [89 %-100 %] 97 % (08/17 0600) FiO2 (%):  [32 %] 32 % (08/16 0938)  General - Well nourished, well developed, middle-aged African-American male in no apparent distress. Cardiovascular - Regular rhythm and rate.  NEURO: Patient is awake alert and interactive.  Level of arousal and orientation to time, place, and person were intact. Awake and alert following commands, oriented x 4, still with dysarthria but improved from yesterday, mild aphasia. Left gaze preference, can not cross midline, right hemianopia, Right arm with some movement can lift right leg against gravity, decrease sensation on right. Left side normal strength. .  Normal strength and sensation on the left.  ASSESSMENT/PLAN [name] is a 66 y.o. male with history of hypertension, hyperlipidemia, former smoking, BMI 25.67, recent NSTEMI s/p cath 12/04/2021 (medical management).  He was doing well after discharge until he developed acute onset right-sided weakness and dysarthria for which he presented to the ED for evaluation. Received IV TNK. To IR with Dr. Katherina Right Rodrgiuez for Left ICA terminus occulsion with TICI 3  Stroke: Acute Left MCA ischemic infarct  s/p IV TNK and thrombectomy of left ICA terminus occlusion with TICI 3 revascularization by Dr. Katherina Right Rodrgiuez Etiology:  cardi embolic source to be determined Code Stroke CT head No acute abnormality. ASPECTS 10.    CTA head & neck with CT perfusion- 1. Occlusion of the distal left ICA (series 6, image 97), with no definite opacification of the left  MCA and its branches. 2. Infarct core in the left MCA territory measures 69 mL, with penumbra measuring 359 mL with a mismatch volume of 290 mL. 3. No other hemodynamically significant stenosis in the head or neck. Cerebral angio Left internal carotid artery angiogram showed recanalization of the left ICA terminus with patent but stenotic proximal left M1/MCA related to residual thrombus with slow distal flow (TICI 2C Post IR CT -1. Successful mechanical thrombectomy for treatment of a left ICA terminus occlusion with direct contact aspiration achieving complete recanalization (TICI 3). 2. No thromboembolic or hemorrhagic complication MRI  Large area of acute/subacute ischemia within the left MCA territory. No hemorrhage or mass effect 2D Echo EF 45-50% LDL 41 HgbA1c 5.5 VTE prophylaxis - SCD's    Diet   Diet NPO time specified Except for: Other (See Comments)   aspirin 81 mg daily and clopidogrel 75 mg daily prior to admission, now on No antithrombotic. Rectal aspirin till able to swallow Therapy recommendations:  CIR Disposition:  pending   Hypertension Home meds:  cozaar, imdur. Metoprolol Stable SBP goal <160 Home meds on hold  Long-term BP goal normotensive  Hyperlipidemia Home meds:  atorvastatin,  held in hospital due to elevated AST/ALT LDL 41, goal <  70 High intensity statin not indicated due to below goal  Monitor liver function, restart if appropriate   Recent NSTEMI  Restart today ASA/Plavix   Other Stroke Risk Factors Advanced Age >/= 47  Cigarette smoker advised to stop smoking Substance abuse - UDS:  THC  Patient advised to stop using due to stroke risk. Coronary artery disease Migraines Congestive heart failure  Other Active Problems Dysphagia  Leukocytosis worsening. CXR with no acute process. Monitor  Hospital day # 1  Beulah Gandy DNP, ACNPC-AG  I have personally obtained history,examined this patient, reviewed notes, independently viewed imaging  studies, participated in medical decision making and plan of care.ROS completed by me personally and pertinent positives fully documented  I have made any additions or clarifications directly to the above note. Agree with note above.  Start rectal aspirin daily he is able to swallow safely.  Transfer out of ICU to neurology floor bed.  Long discussion with patient and wife at the bedside and answered questions.This patient is critically ill and at significant risk of neurological worsening, death and care requires constant monitoring of vital signs, hemodynamics,respiratory and cardiac monitoring, extensive review of multiple databases, frequent neurological assessment, discussion with family, other specialists and medical decision making of high complexity.I have made any additions or clarifications directly to the above note.This critical care time does not reflect procedure time, or teaching time or supervisory time of PA/NP/Med Resident etc but could involve care discussion time.  I spent 30 minutes of neurocritical care time  in the care of  this patient.      Antony Contras, MD Medical Director [facility] Stroke Center Pager: [phone] 12/11/2021 1:26 PM   To contact Stroke Continuity provider, please refer to [email or web address] After hours, contact General Neurology

## 2021-12-11 NOTE — Progress Notes (Signed)
IP rehab admissions - I met with patient and his wife at the bedside.  Wife is interested in rehab closer to home.  Wife mentioned pursuing a rehab in [place].  Recommend pursuit of rehab/SNF in [place] closer to patient's home in [place], [place].  Call me for questions.  [phone]

## 2021-12-11 NOTE — Evaluation (Signed)
Occupational Therapy Evaluation Patient Details Name: Terry Mayo MRN: [id number] DOB: 19-Oct-1955 Today's Date: 12/11/2021   History of Present Illness Pt is a 66 y/o male admitted 8/15 with acute onset of right-sided weakness and dysarthria.  MRI pending.  PMHx HTN, HLD, former smoker, recent NSTEMI, s/p cath 12/04/2021   Clinical Impression   Pt admitted for concerns listed above. PTA pt reported that he was independent with all ADL's and functional mobility. Pt with increased difficulty attending to his R side, with severe visual field cut on the R side. He is requiring assist for mobility and all ADL tasks, including engaging RUE with tasks using his LUE. Recommending SNF at this time, as pt's wife would rather a facility closer to home than [facility]. OT will continue to follow acutely.      Recommendations for follow up therapy are one component of a multi-disciplinary discharge planning process, led by the attending physician.  Recommendations may be updated based on patient status, additional functional criteria and insurance authorization.   Follow Up Recommendations  Skilled nursing-short term rehab (<3 hours/day)    Assistance Recommended at Discharge Frequent or constant Supervision/Assistance  Patient can return home with the following A lot of help with walking and/or transfers;A lot of help with bathing/dressing/bathroom;Assistance with cooking/housework;Direct supervision/assist for medications management;Assist for transportation;Help with stairs or ramp for entrance    Functional Status Assessment  Patient has had a recent decline in their functional status and demonstrates the ability to make significant improvements in function in a reasonable and predictable amount of time.  Equipment Recommendations  Other (comment) (defer to next venue of care)    Recommendations for Other Services       Precautions / Restrictions Precautions Precautions: Fall Restrictions Weight  Bearing Restrictions: No      Mobility Bed Mobility Overal bed mobility: Needs Assistance Bed Mobility: Supine to Sit, Sit to Supine   Sidelying to sit: Min guard Supine to sit: Min assist Sit to supine: Mod assist   General bed mobility comments: Requiring max verbal cues for all bed mobility    Transfers Overall transfer level: Needs assistance Equipment used: None Transfers: Sit to/from Stand Sit to Stand: Min assist           General transfer comment: max cues for R side/R UE awareness, assist with stability.      Balance Overall balance assessment: Needs assistance Sitting-balance support: No upper extremity supported, Single extremity supported, Feet supported Sitting balance-Leahy Scale: Fair     Standing balance support: Single extremity supported, No upper extremity supported, During functional activity Standing balance-Leahy Scale: Poor                             ADL either performed or assessed with clinical judgement   ADL Overall ADL's : Needs assistance/impaired Eating/Feeding: Minimal assistance;Sitting   Grooming: Minimal assistance;Sitting   Upper Body Bathing: Moderate assistance;Sitting   Lower Body Bathing: Maximal assistance;+2 for safety/equipment;Sitting/lateral leans;Sit to/from stand   Upper Body Dressing : Minimal assistance;Sitting   Lower Body Dressing: Maximal assistance;+2 for safety/equipment;Sitting/lateral leans;Sit to/from stand   Toilet Transfer: Minimal assistance;Ambulation   Toileting- Clothing Manipulation and Hygiene: Moderate assistance;Sitting/lateral lean;Sit to/from stand       Functional mobility during ADLs: Minimal assistance;Rolling walker (2 wheels) General ADL Comments: Need increased assist due to balance deficit, flaccid RUE, and visual/attention deficits.     Vision Baseline Vision/History: 0 No visual deficits Ability to  See in Adequate Light: 0 Adequate Patient Visual Report: No  change from baseline Vision Assessment?: Yes Eye Alignment: Within Functional Limits Ocular Range of Motion: Restricted on the right;Restricted looking up;Restricted looking down Alignment/Gaze Preference: Head turned;Gaze left Tracking/Visual Pursuits: Impaired - to be further tested in functional context;Decreased smoothness of eye movement to RIGHT superior field;Decreased smoothness of eye movement to RIGHT inferior field;Requires cues, head turns, or add eye shifts to track Visual Fields: Right visual field deficit Depth Perception: Overshoots;Undershoots Additional Comments: SEvere R field cut, unable to direct gaze and hold it at midline, stays slightly L     Perception     Praxis      Pertinent Vitals/Pain Pain Assessment Pain Assessment: No/denies pain Pain Score: 0-No pain Pain Intervention(s): Monitored during session     Hand Dominance Right   Extremity/Trunk Assessment Upper Extremity Assessment Upper Extremity Assessment: RUE deficits/detail RUE Deficits / Details: trace movement in RUE RUE Sensation: decreased light touch;decreased proprioception RUE Coordination: decreased fine motor;decreased gross motor   Lower Extremity Assessment Lower Extremity Assessment: Defer to PT evaluation   Cervical / Trunk Assessment Cervical / Trunk Assessment: Normal   Communication Communication Communication: Expressive difficulties   Cognition Arousal/Alertness: Awake/alert Behavior During Therapy: WFL for tasks assessed/performed Overall Cognitive Status: Impaired/Different from baseline Area of Impairment: Attention, Memory, Safety/judgement, Following commands, Awareness, Problem solving                   Current Attention Level: Sustained Memory: Decreased short-term memory Following Commands: Follows one step commands with increased time, Follows multi-step commands inconsistently Safety/Judgement: Decreased awareness of safety, Decreased awareness of  deficits Awareness: Intellectual Problem Solving: Slow processing, Requires verbal cues General Comments: Decreased awareness of R side, requiring max cuing for safety and awareness     General Comments  VSS on RA    Exercises     Shoulder Instructions      Home Living Family/patient expects to be discharged to:: Private residence Living Arrangements: Spouse/significant other Available Help at Discharge: Family;Available 24 hours/day Type of Home: House Home Access: Level entry     Home Layout: One level     Bathroom Shower/Tub: Chief Strategy Officer: Standard     Home Equipment: None          Prior Functioning/Environment Prior Level of Function : Independent/Modified Independent;Driving                        OT Problem List: Decreased strength      OT Treatment/Interventions: Self-care/ADL training;Therapeutic exercise;Energy conservation;DME and/or AE instruction;Therapeutic activities;Neuromuscular education;Cognitive remediation/compensation;Visual/perceptual remediation/compensation;Patient/family education;Balance training    OT Goals(Current goals can be found in the care plan section) Acute Rehab OT Goals Patient Stated Goal: To get independence back OT Goal Formulation: With patient Time For Goal Achievement: 12/25/21 Potential to Achieve Goals: Good ADL Goals Pt Will Perform Grooming: standing;with min guard assist Pt Will Perform Lower Body Bathing: with min guard assist;sitting/lateral leans;sit to/from stand Pt Will Perform Lower Body Dressing: with min guard assist;sitting/lateral leans;sit to/from stand Pt Will Transfer to Toilet: with supervision;ambulating Pt Will Perform Toileting - Clothing Manipulation and hygiene: with supervision;sitting/lateral leans;sit to/from stand Pt/caregiver will Perform Home Exercise Program: Right Upper extremity;Both right and left upper extremity;Increased ROM;Increased strength Additional  ADL Goal #1: Pt will attend to the R side 75 % of session with minimal cuing.  OT Frequency: Min 2X/week    Co-evaluation PT/OT/SLP Co-Evaluation/Treatment: Yes Reason for Co-Treatment:  Complexity of the patient's impairments (multi-system involvement);For patient/therapist safety;To address functional/ADL transfers   OT goals addressed during session: ADL's and self-care;Strengthening/ROM      AM-PAC OT "6 Clicks" Daily Activity     Outcome Measure Help from another person eating meals?: A Little Help from another person taking care of personal grooming?: A Little Help from another person toileting, which includes using toliet, bedpan, or urinal?: A Lot Help from another person bathing (including washing, rinsing, drying)?: A Lot Help from another person to put on and taking off regular upper body clothing?: A Little Help from another person to put on and taking off regular lower body clothing?: A Lot 6 Click Score: 15   End of Session Equipment Utilized During Treatment: Gait belt Nurse Communication: Mobility status  Activity Tolerance: Patient tolerated treatment well Patient left: in bed;with call bell/phone within reach (headed off unit with transport)  OT Visit Diagnosis: Unsteadiness on feet (R26.81);Other abnormalities of gait and mobility (R26.89);Muscle weakness (generalized) (M62.81);Hemiplegia and hemiparesis Hemiplegia - Right/Left: Right Hemiplegia - dominant/non-dominant: Dominant Hemiplegia - caused by: Cerebral infarction                Time: [id number] OT Time Calculation (min): 19 min Charges:  OT General Charges $OT Visit: 1 Visit OT Evaluation $OT Eval Moderate Complexity: 1 Mod  Frady Taddeo H., OTR/L Acute Rehabilitation  Gilda Abboud Elane Auryn Paige 12/11/2021, 6:45 PM

## 2021-12-11 NOTE — Progress Notes (Signed)
Speech Language Pathology Treatment: Dysphagia  Patient Details Name: Terry Mayo MRN: [id number] DOB: 1955/07/09 Today's Date: 12/11/2021 Time: 0131-4388 SLP Time Calculation (min) (ACUTE ONLY): 15 min  Assessment / Plan / Recommendation Clinical Impression  Patient seen by SLP for skilled treatment focused on dysphagia goals. His spouse and sister were both present as well. Patient was more alert than previous date but said he did not sleep well. After oral care, SLP assessed patient's toleration of spoon sips of honey thick liquids followed by cup sips with patient able to self-manage. He exhibited a delayed oral preparatory phase with spoon but it was improved from initial eval on previous date. He did not exhibit any anterior spillage with cup sips of honey thick liquids but did appear with swallow initiation delay. No overt s/s aspiration or penetration and no change in vitals. SLP recommending to proceed with MBS today prior to recommending any PO's.    HPI HPI: Patient is a 66 y.o. male with PMH: HTN, HLD, former smoker, recent NSTEMI s/p cath (12/04/21). He was reportedly doing well since cath procedure until he developed acute onset right sided weakness and dysarthria. He presented to the ED for evaluation and was administered TNK. CT head did not show any acute intracranial abnormality but CT angio head neck showed definite opacification of the left MCA and its branches; Infarct core in the left MCA territory. MRI pending. He was intubted on 12/10/21 for thrombectomy at 0130 and extubated same day 0938.      SLP Plan  Continue with current plan of care;MBS      Recommendations for follow up therapy are one component of a multi-disciplinary discharge planning process, led by the attending physician.  Recommendations may be updated based on patient status, additional functional criteria and insurance authorization.    Recommendations  Diet recommendations: NPO Medication  Administration: Via alternative means                Oral Care Recommendations: Oral care QID;Staff/trained caregiver to provide oral care;Oral care before and after PO Follow Up Recommendations: Acute inpatient rehab (3hours/day) Assistance recommended at discharge: Frequent or constant Supervision/Assistance SLP Visit Diagnosis: Dysphagia, unspecified (R13.10) Plan: Continue with current plan of care;MBS           Angela Nevin, MA, CCC-SLP Speech Therapy

## 2021-12-11 NOTE — Progress Notes (Signed)
Physical Therapy Treatment Patient Details Name: Terry Mayo MRN: [id number] DOB: Dec 09, 1955 Today's Date: 12/11/2021   History of Present Illness Pt is a 66 y/o male admitted 8/15 with acute onset of right-sided weakness and dysarthria.  MRI pending.  PMHx HTN, HLD, former smoker, recent NSTEMI, s/p cath 12/04/2021    PT Comments    Pt shows a little more stability today, still needing max cues for low awareness to his R side, but ambulation is more of an automatic task for him and he can manage R LE control with low awareness.  Emphasis on transitions to EOB, sit to stand safety, progression of gait stability with focus on scanning and awareness of his environment on the R side.   Recommendations for follow up therapy are one component of a multi-disciplinary discharge planning process, led by the attending physician.  Recommendations may be updated based on patient status, additional functional criteria and insurance authorization.  Follow Up Recommendations  Acute inpatient rehab (3hours/day) (pt's wife wants rehab to occur in [place], so she can be nearby due to transportation issues.)     Assistance Recommended at Discharge Frequent or constant Supervision/Assistance  Patient can return home with the following A little help with walking and/or transfers;A little help with bathing/dressing/bathroom;Assistance with cooking/housework;Assist for transportation;Help with stairs or ramp for entrance   Equipment Recommendations  Other (comment) (TBD)    Recommendations for Other Services       Precautions / Restrictions Precautions Precautions: Fall     Mobility  Bed Mobility Overal bed mobility: Needs Assistance Bed Mobility: Supine to Sit, Sit to Supine   Sidelying to sit:  (max cues) Supine to sit: Min assist (max cues) Sit to supine: Mod assist (max cues)   General bed mobility comments: cues for direction, assist LE's and trunk for coordination of movement up via L  Elbow.  Assistingwith awareness R UE for safety    Transfers Overall transfer level: Needs assistance   Transfers: Sit to/from Stand Sit to Stand: Min assist           General transfer comment: max cues for R side/R UE awareness, assist with stability.    Ambulation/Gait Ambulation/Gait assistance: Min assist, Mod assist Gait Distance (Feet): 100 Feet Assistive device: None Gait Pattern/deviations: Step-through pattern   Gait velocity interpretation: <1.31 ft/sec, indicative of household ambulator   General Gait Details: mildly unsteady overall with worsening list/drift R with fatigue.  R UE hanging heavy, R LE showing uncoordinated gait pattern with soft hyperextension due to decreased knee control for sensory loss.   Stairs             Wheelchair Mobility    Modified Rankin (Stroke Patients Only) Modified Rankin (Stroke Patients Only) Pre-Morbid Rankin Score: No symptoms Modified Rankin: Moderately severe disability     Balance Overall balance assessment: Needs assistance Sitting-balance support: No upper extremity supported, Single extremity supported, Feet supported Sitting balance-Leahy Scale: Fair     Standing balance support: Single extremity supported, No upper extremity supported, During functional activity Standing balance-Leahy Scale: Poor (to fair) Standing balance comment: Worked on pregait, w/shifting, un-weighting each leg prior to gait.                            Cognition Arousal/Alertness: Awake/alert Behavior During Therapy: Flat affect Overall Cognitive Status: Impaired/Different from baseline (NT formally--low awareness to his right environment)  Exercises      General Comments General comments (skin integrity, edema, etc.): vss      Pertinent Vitals/Pain Pain Assessment Pain Assessment: Faces Faces Pain Scale: No hurt Pain Intervention(s): Monitored during  session    Home Living                          Prior Function            PT Goals (current goals can now be found in the care plan section) Acute Rehab PT Goals Patient Stated Goal: pt didn't state PT Goal Formulation: Patient unable to participate in goal setting Time For Goal Achievement: 12/24/21 Potential to Achieve Goals: Good Progress towards PT goals: Progressing toward goals    Frequency    Min 4X/week      PT Plan Current plan remains appropriate    Co-evaluation PT/OT/SLP Co-Evaluation/Treatment: Yes Reason for Co-Treatment: For patient/therapist safety;To address functional/ADL transfers PT goals addressed during session: Mobility/safety with mobility        AM-PAC PT "6 Clicks" Mobility   Outcome Measure  Help needed turning from your back to your side while in a flat bed without using bedrails?: A Little Help needed moving from lying on your back to sitting on the side of a flat bed without using bedrails?: A Little Help needed moving to and from a bed to a chair (including a wheelchair)?: A Lot Help needed standing up from a chair using your arms (e.g., wheelchair or bedside chair)?: A Little Help needed to walk in hospital room?: A Lot Help needed climbing 3-5 steps with a railing? : A Lot 6 Click Score: 15    End of Session   Activity Tolerance: Patient tolerated treatment well Patient left: in bed;with call bell/phone within reach;Other (comment) (going to swallow stucy) Nurse Communication: Mobility status PT Visit Diagnosis: Other abnormalities of gait and mobility (R26.89);Hemiplegia and hemiparesis Hemiplegia - Right/Left: Right Hemiplegia - caused by: Cerebral infarction     Time: 2025-4270 PT Time Calculation (min) (ACUTE ONLY): 19 min  Charges:  $Gait Training: 8-22 mins                     12/11/2021  Jacinto Halim., PT Acute Rehabilitation Services [phone]  (pager) [phone]  (office)   [name]  [name] 12/11/2021, 1:44 PM

## 2021-12-11 NOTE — Progress Notes (Signed)
Modified Barium Swallow Progress Note  Patient Details  Name: Terry Mayo MRN: [id number] Date of Birth: 18-Feb-1956  Today's Date: 12/11/2021  Modified Barium Swallow completed.  Full report located under Chart Review in the Imaging Section.  Brief recommendations include the following:  Clinical Impression  Patient presents with a mild-modeate oropharyngeal dysphagia as per this MBS. During oral phase, he exhibited weak lingual manipulation and decreased anterior to posterior transit as well as piecemeal swallowing of all liquids and solids. Mastication of mechanical soft solids was significantly delayed. During pharyngeal phase, patient exhibited swallow initiation delays to level of vallecular sinus with mechanical soft solids and honey thick liquids and delays to level of pyriform sinus with thin and nectar thick liquids. Trace amount of residuals observed in vallecular sinus s/p initial swallows of nectar thick liquids and trace residuals in both vallecular and pyriform sinus observed with honey thick liquids. With thin liquids, patient initially did not have any residuals post swallow but he then had pooling of oral residuals of thin into vallecular and pyriform sinus without sensation, requiring cue for him to swallow. No penetration or aspiration observed with any of the tested barium consistencies. SLP is recommending to start with PO diet of Dys 1 (puree ) solids and honey thick liquids.   Swallow Evaluation Recommendations       SLP Diet Recommendations: Dysphagia 1 (Puree) solids;Honey thick liquids   Liquid Administration via: Cup   Medication Administration: Crushed with puree   Supervision: Patient able to self feed;Full supervision/cueing for compensatory strategies   Compensations: Slow rate;Small sips/bites   Postural Changes: Seated upright at 90 degrees   Oral Care Recommendations: Oral care BID;Oral care before and after PO        Angela Nevin, MA,  CCC-SLP Speech Therapy

## 2021-12-12 DIAGNOSIS — I10 Essential (primary) hypertension: Secondary | ICD-10-CM

## 2021-12-12 DIAGNOSIS — G8191 Hemiplegia, unspecified affecting right dominant side: Secondary | ICD-10-CM

## 2021-12-12 DIAGNOSIS — I669 Occlusion and stenosis of unspecified cerebral artery: Secondary | ICD-10-CM | POA: Diagnosis not present

## 2021-12-12 DIAGNOSIS — I63512 Cerebral infarction due to unspecified occlusion or stenosis of left middle cerebral artery: Secondary | ICD-10-CM

## 2021-12-12 DIAGNOSIS — I63312 Cerebral infarction due to thrombosis of left middle cerebral artery: Secondary | ICD-10-CM

## 2021-12-12 DIAGNOSIS — E785 Hyperlipidemia, unspecified: Secondary | ICD-10-CM

## 2021-12-12 DIAGNOSIS — I63232 Cerebral infarction due to unspecified occlusion or stenosis of left carotid arteries: Secondary | ICD-10-CM

## 2021-12-12 DIAGNOSIS — R471 Dysarthria and anarthria: Secondary | ICD-10-CM

## 2021-12-12 DIAGNOSIS — E7849 Other hyperlipidemia: Secondary | ICD-10-CM

## 2021-12-12 DIAGNOSIS — R1319 Other dysphagia: Secondary | ICD-10-CM

## 2021-12-12 LAB — CBC
HCT: 39.5 % (ref 39.0–52.0)
Hemoglobin: 13.8 g/dL (ref 13.0–17.0)
MCH: 32.2 pg (ref 26.0–34.0)
MCHC: 34.9 g/dL (ref 30.0–36.0)
MCV: 92.3 fL (ref 80.0–100.0)
Platelets: 170 10*3/uL (ref 150–400)
RBC: 4.28 MIL/uL (ref 4.22–5.81)
RDW: 14 % (ref 11.5–15.5)
WBC: 14.4 10*3/uL — ABNORMAL HIGH (ref 4.0–10.5)
nRBC: 0 % (ref 0.0–0.2)

## 2021-12-12 LAB — GLUCOSE, CAPILLARY
Glucose-Capillary: 127 mg/dL — ABNORMAL HIGH (ref 70–99)
Glucose-Capillary: 104 mg/dL — ABNORMAL HIGH (ref 70–99)
Glucose-Capillary: 88 mg/dL (ref 70–99)
Glucose-Capillary: 88 mg/dL (ref 70–99)
Glucose-Capillary: 134 mg/dL — ABNORMAL HIGH (ref 70–99)

## 2021-12-12 LAB — BASIC METABOLIC PANEL
Anion gap: 7 (ref 5–15)
BUN: 19 mg/dL (ref 8–23)
CO2: 22 mmol/L (ref 22–32)
Calcium: 8.3 mg/dL — ABNORMAL LOW (ref 8.9–10.3)
Chloride: 110 mmol/L (ref 98–111)
Creatinine, Ser: 1.22 mg/dL (ref 0.61–1.24)
GFR, Estimated: >60 mL/min (ref >60)
Glucose, Bld: 117 mg/dL — ABNORMAL HIGH (ref 70–99)
Potassium: 3.9 mmol/L (ref 3.5–5.1)
Sodium: 139 mmol/L (ref 135–145)

## 2021-12-12 MED ORDER — PANTOPRAZOLE SODIUM 40 MG PO TBEC: 40.0000 mg | DELAYED_RELEASE_TABLET | Freq: Every day | ORAL | Status: DC

## 2021-12-12 MED ORDER — PANTOPRAZOLE 2 MG/ML SUSPENSION
40.0000 mg | Freq: Every day | ORAL | Status: DC
  Administered 2021-12-12 – 2021-12-16 (×5): 40 mg via ORAL
  Filled 2021-12-12 (×5): qty 20

## 2021-12-12 NOTE — NC FL2 (Signed)
[place] MEDICAID FL2 LEVEL OF CARE SCREENING TOOL     IDENTIFICATION  Patient Name: Terry Mayo Birthdate: [birth year] Sex: male Admission Date (Current Location): 12/09/2021  [facility] and [place] Number:  [employer] and Address:  [facility], [address] [place], [place] [address]      Provider Number: [id number]  Attending Physician Name and Address:  Stroke, Md, MD  Relative Name and Phone Number:       Current Level of Care: Hospital Recommended Level of Care: Skilled Nursing Facility Prior Approval Number:    Date Approved/Denied:   PASRR Number: [id number]  Discharge Plan: SNF    Current Diagnoses: Patient Active Problem List   Diagnosis Date Noted   Status post stroke 12/10/2021   Acute left ICA ischemic stroke (HCC) 12/10/2021   CAD (coronary artery disease) 12/05/2021   Ischemic cardiomyopathy 12/05/2021   NSVT (nonsustained ventricular tachycardia) (HCC) 12/05/2021   Hypertension 12/05/2021   Hyperlipidemia 12/05/2021   NSTEMI (non-ST elevated myocardial infarction) (HCC) 12/04/2021   Colon cancer screening    Loss of weight 09/04/2015   Encounter for screening colonoscopy 09/04/2015    Orientation RESPIRATION BLADDER Height & Weight     Self, Time, Situation, Place  Normal Continent Weight: 199 lb 15.3 oz (90.7 kg) Height:  6\' 2"  (188 cm)  BEHAVIORAL SYMPTOMS/MOOD NEUROLOGICAL BOWEL NUTRITION STATUS      Continent Diet (see DC summary)  AMBULATORY STATUS COMMUNICATION OF NEEDS Skin   Limited Assist Verbally Surgical wounds, Skin abrasions (closed right groin, gauze dressing; right face laceration, foam dressing: lift every shift to assess, change PRN)                       Personal Care Assistance Level of Assistance  Bathing, Feeding, Dressing Bathing Assistance: Limited assistance Feeding assistance: Limited assistance Dressing Assistance: Limited assistance     Functional Limitations Info   Speech     Speech Info: Impaired (dysarthria)    SPECIAL CARE FACTORS FREQUENCY  PT (By licensed PT), OT (By licensed OT)     PT Frequency: 5x/wk OT Frequency: 5x/wk            Contractures Contractures Info: Not present    Additional Factors Info  Code Status, Allergies, Insulin Sliding Scale Code Status Info: Full Allergies Info: Penicillins   Insulin Sliding Scale Info: see DC summary       Current Medications (12/12/2021):  This is the current hospital active medication list Current Facility-Administered Medications  Medication Dose Route Frequency Provider Last Rate Last Admin   0.9 %  sodium chloride infusion   Intravenous Continuous Bhagat, Srishti L, MD 50 mL/hr at 12/11/21 2150 New Bag at 12/11/21 2150   0.9 %  sodium chloride infusion  250 mL Intravenous Continuous 12/13/21, MD   Held at 12/10/21 [phone]   acetaminophen (TYLENOL) tablet 650 mg  650 mg Oral Q4H PRN de 5852, Melchor Amour, MD       Or   acetaminophen (TYLENOL) 160 MG/5ML solution 650 mg  650 mg Per Tube Q4H PRN de Jerilynn Mages, Melchor Amour, MD       Or   acetaminophen (TYLENOL) suppository 650 mg  650 mg Rectal Q4H PRN de Jerilynn Mages, Melchor Amour, MD       aspirin chewable tablet 81 mg  81 mg Oral Daily Jerilynn Mages A, NP   81 mg at 12/12/21 1016   Chlorhexidine Gluconate Cloth 2 %  PADS 6 each  6 each Topical Daily Bhagat, Srishti L, MD   6 each at 12/11/21 2200   clopidogrel (PLAVIX) tablet 75 mg  75 mg Oral Daily Gevena Mart A, NP   75 mg at 12/12/21 1016   docusate (COLACE) 50 MG/5ML liquid 100 mg  100 mg Per Tube BID Omar Person, MD       hydrALAZINE (APRESOLINE) injection 20 mg  20 mg Intravenous Q4H PRN Gevena Mart A, NP   20 mg at 12/10/21 1803   insulin aspart (novoLOG) injection 1-3 Units  1-3 Units Subcutaneous Q4H Steffanie Dunn, DO   1 Units at 12/11/21 1948   labetalol (NORMODYNE) injection 20 mg  20 mg Intravenous Q10 min PRN Gevena Mart A, NP   20 mg at  12/10/21 1159   Oral care mouth rinse  15 mL Mouth Rinse PRN Steffanie Dunn, DO       Oral care mouth rinse  15 mL Mouth Rinse PRN Lorin Glass, MD       oxymetazoline (AFRIN) 0.05 % nasal spray 3 spray  3 spray Each Nare BID PRN Omar Person, MD   3 spray at 12/10/21 0412   pantoprazole (PROTONIX) 2 mg/mL oral suspension 40 mg  40 mg Oral Daily Joaquim Nam, RPH   40 mg at 12/12/21 1016   polyethylene glycol (MIRALAX / GLYCOLAX) packet 17 g  17 g Per Tube Daily Omar Person, MD       senna-docusate (Senokot-S) tablet 1 tablet  1 tablet Oral QHS PRN Bhagat, Karmen Bongo, MD         Discharge Medications: Please see discharge summary for a list of discharge medications.  Relevant Imaging Results:  Relevant Lab Results:   Additional Information SS#: [id number]  Baldemar Lenis, LCSW

## 2021-12-12 NOTE — Progress Notes (Addendum)
STROKE TEAM PROGRESS NOTE     INTERVAL HISTORY Awake and alert following commands, oriented x 4, slowly improving, can now cross midline and gaze is more midline. Still with right hemianopia, right side weakness and dysarthria. MBS done yesterday, passed for modified diet with honey think.  He has no current complaint and no new issues. He is in good spirits today and looking forward to rehab to improve. No family at bedside. We discussed IRL if no afib seen on tele before dc. Spoke with Case Mgt, IPR said no, so now looking for SNF. Will not dc till Mon or Tue most likely.  Vitals:   12/11/21 2331 12/12/21 0442 12/12/21 0813 12/12/21 1141  BP: 111/69 106/73 118/73 133/84  Pulse: 65 63 (!) 58 61  Resp: 17 16 16 18   Temp: 98 F (36.7 C) 98.2 F (36.8 C) 98.1 F (36.7 C) 97.8 F (36.6 C)  TempSrc: Oral Oral Oral Oral  SpO2: 100% 98% 100% 100%  Weight:      Height:       CBC:  Recent Labs  Lab 12/09/21 2235 12/10/21 0350 12/11/21 0612 12/12/21 0308  WBC 11.1*  --  17.0* 14.4*  NEUTROABS 4.6  --   --   --   HGB 15.8   < > 16.0 13.8  HCT 45.0   < > 45.2 39.5  MCV 93.2  --  92.6 92.3  PLT 196  --  176 170   < > = values in this interval not displayed.    Basic Metabolic Panel:  Recent Labs  Lab 12/11/21 0612 12/12/21 0308  NA 141 139  K 3.9 3.9  CL 109 110  CO2 22 22  GLUCOSE 116* 117*  BUN 18 19  CREATININE 1.52* 1.22  CALCIUM 8.6* 8.3*    Lipid Panel:  Recent Labs  Lab 12/11/21 0612  TRIG 58    HgbA1c:  No results for input(s): "HGBA1C" in the last 168 hours.  Urine Drug Screen:  Recent Labs  Lab 12/10/21 0916  LABOPIA NONE DETECTED  COCAINSCRNUR NONE DETECTED  LABBENZ NONE DETECTED  AMPHETMU NONE DETECTED  THCU POSITIVE*  LABBARB NONE DETECTED     Alcohol Level  Recent Labs  Lab 12/09/21 2235  ETH <10     IMAGING past 24 hours DG Swallowing Func-Speech Pathology  Result Date: 12/11/2021 Table formatting from the original result was not  included. Objective Swallowing Evaluation: Type of Study: MBS-Modified Barium Swallow Study  Patient Details Name: CHANDER MCCARGO MRN: [id number] Date of Birth: 02-Jun-1955 Today's Date: 12/11/2021 Time: SLP Start Time (ACUTE ONLY): 1345 -SLP Stop Time (ACUTE ONLY): 1400 SLP Time Calculation (min) (ACUTE ONLY): 15 min Past Medical History: Past Medical History: Diagnosis Date  Arthritis   Essential hypertension   Hyperlipidemia   Kidney stones  Past Surgical History: Past Surgical History: Procedure Laterality Date  CHOLECYSTECTOMY  1990s  COLONOSCOPY WITH PROPOFOL N/A 09/30/2015  Procedure: COLONOSCOPY WITH PROPOFOL;  Surgeon: Daneil Dolin, MD;  [facility];  Service: Endoscopy;  Laterality: N/A;  930  IR CT HEAD LTD  12/10/2021  IR PERCUTANEOUS ART THROMBECTOMY/INFUSION INTRACRANIAL INC DIAG ANGIO  12/10/2021  IR US GUIDE VASC ACCESS RIGHT  12/10/2021  KIDNEY STONE SURGERY  2005  LEFT HEART CATH AND CORONARY ANGIOGRAPHY N/A 12/04/2021  Procedure: LEFT HEART CATH AND CORONARY ANGIOGRAPHY;  Surgeon: Belva Crome, MD;  Location: [facility];  Service: Cardiovascular;  Laterality: N/A;  RADIOLOGY WITH ANESTHESIA N/A 12/10/2021  Procedure: IR WITH ANESTHESIA;  Surgeon: Luanne Bras, MD;  Location: [facility];  Service: Radiology;  Laterality: N/A; HPI: Patient is a 66 y.o. male with PMH: HTN, HLD, former smoker, recent NSTEMI s/p cath (12/04/21). He was reportedly doing well since cath procedure until he developed acute onset right sided weakness and dysarthria. He presented to the ED for evaluation and was administered TNK. CT head did not show any acute intracranial abnormality but CT angio head neck showed definite opacification of the left MCA and its branches; Infarct core in the left MCA territory. MRI pending. He was intubted on 12/10/21 for thrombectomy at 0130 and extubated same day 0938.  Subjective: awake, alert  Recommendations for follow up therapy are one component of a multi-disciplinary  discharge planning process, led by the attending physician.  Recommendations may be updated based on patient status, additional functional criteria and insurance authorization. Assessment / Plan / Recommendation   12/11/2021   2:57 PM Clinical Impressions Clinical Impression Patient presents with a mild-modeate oropharyngeal dysphagia as per this MBS. During oral phase, he exhibited weak lingual manipulation and decreased anterior to posterior transit as well as piecemeal swallowing of all liquids and solids. Mastication of mechanical soft solids was significantly delayed. During pharyngeal phase, patient exhibited swallow initiation delays to level of vallecular sinus with mechanical soft solids and honey thick liquids and delays to level of pyriform sinus with thin and nectar thick liquids. Trace amount of residuals observed in vallecular sinus s/p initial swallows of nectar thick liquids and trace residuals in both vallecular and pyriform sinus observed with honey thick liquids. With thin liquids, patient initially did not have any residuals post swallow but he then had pooling of oral residuals of thin into vallecular and pyriform sinus without sensation, requiring cue for him to swallow. No penetration or aspiration observed with any of the tested barium consistencies. SLP is recommending to start with PO diet of Dys 1 (puree ) solids and honey thick liquids. SLP Visit Diagnosis Dysphagia, oropharyngeal phase (R13.12) Impact on safety and function Mild aspiration risk     12/11/2021   2:57 PM Treatment Recommendations Treatment Recommendations Therapy as outlined in treatment plan below     12/11/2021   3:03 PM Prognosis Prognosis for Safe Diet Advancement Good Barriers to Reach Goals Time post onset;Severity of deficits   12/11/2021   2:57 PM Diet Recommendations SLP Diet Recommendations Dysphagia 1 (Puree) solids;Honey thick liquids Liquid Administration via Cup Medication Administration Crushed with puree  Compensations Slow rate;Small sips/bites Postural Changes Seated upright at 90 degrees     12/11/2021   2:57 PM Other Recommendations Oral Care Recommendations Oral care BID;Oral care before and after PO Follow Up Recommendations Acute inpatient rehab (3hours/day) Assistance recommended at discharge Frequent or constant Supervision/Assistance Functional Status Assessment Patient has had a recent decline in their functional status and demonstrates the ability to make significant improvements in function in a reasonable and predictable amount of time.   12/11/2021   2:57 PM Frequency and Duration  Speech Therapy Frequency (ACUTE ONLY) min 2x/week Treatment Duration 2 weeks     12/11/2021   2:46 PM Oral Phase Oral Phase Impaired Oral - Honey Cup Weak lingual manipulation;Delayed oral transit Oral - Nectar Cup Weak lingual manipulation;Piecemeal swallowing Oral - Thin Cup Piecemeal swallowing;Weak lingual manipulation Oral - Puree Weak lingual manipulation;Reduced posterior propulsion;Delayed oral transit;Piecemeal swallowing Oral - Mech Soft Reduced posterior propulsion;Impaired mastication;Weak lingual manipulation;Piecemeal swallowing    12/11/2021   2:56 PM Pharyngeal Phase  Pharyngeal Phase Impaired Pharyngeal- Honey Cup Delayed swallow initiation-vallecula;Pharyngeal residue - valleculae;Pharyngeal residue - pyriform Pharyngeal- Nectar Cup Delayed swallow initiation-pyriform sinuses;Pharyngeal residue - valleculae Pharyngeal- Thin Cup Delayed swallow initiation-pyriform sinuses;Pharyngeal residue - valleculae;Pharyngeal residue - pyriform Pharyngeal- Puree Pharyngeal residue - pyriform Pharyngeal- Mechanical Soft Delayed swallow initiation-vallecula    12/11/2021   2:57 PM Cervical Esophageal Phase  Cervical Esophageal Phase Leonarda Salon Angela Nevin, MA, CCC-SLP Speech Therapy                      PHYSICAL EXAM  Temp:  [97.8 F (36.6 C)-99 F (37.2 C)] 97.8 F (36.6 C) (08/18 1141) Pulse Rate:  [58-93] 61 (08/18  1141) Resp:  [16-31] 18 (08/18 1141) BP: (106-188)/(66-153) 133/84 (08/18 1141) SpO2:  [94 %-100 %] 100 % (08/18 1141)  General - Well nourished, well developed, middle-aged African-American male in no apparent distress. Resp: Lungs clear, breathing rate normal Cardiovascular - Regular rhythm and rate.  NEURO: Patient is awake alert and interactive, good attention and pleasant affect> Briskly following commands, oriented x 4, still with mild-mod dysarthria, mild aphasia. Left gaze preference, but is more midline and can now cross midline briefly with much coaching. Right hemianopia, Right arm with some movement can lift right leg against gravity, decrease sensation on right. Left side normal strength. Normal strength and sensation on the left.  ASSESSMENT/PLAN [name] is a 66 y.o. male with history of hypertension, hyperlipidemia, former smoking, recent NSTEMI s/p cath 12/04/2021 (medical management, no PCI). On 8/15 he presented to the ED when he developed acute onset right-sided weakness and dysarthria. He received IV TNK and then thrombectomy with Dr. Joana Reamer Rodrgiuez for Left ICA terminus occulsion with TICI 3.  Stroke: Acute Left MCA ischemic infarct  s/p IV TNK and thrombectomy of left ICA terminus occlusion with TICI 3 revascularization by Dr. Joana Reamer Rodrgiuez Etiology:  cardioembolic source to be determined Code Stroke CT head No acute abnormality. ASPECTS 10.    CTA head & neck with CT perfusion- 1. Occlusion of the distal left ICA (series 6, image 97), with no definite opacification of the left MCA and its branches. 2. Infarct core in the left MCA territory measures 69 mL, with penumbra measuring 359 mL with a mismatch volume of 290 mL. 3. No other hemodynamically significant stenosis in the head or neck. Cerebral angio Left internal carotid artery angiogram showed recanalization of the left ICA terminus with patent but stenotic proximal left M1/MCA related to residual  thrombus with slow distal flow (TICI 2C Post IR CT -1. Successful mechanical thrombectomy for treatment of a left ICA terminus occlusion with direct contact aspiration achieving complete recanalization (TICI 3). 2. No thromboembolic or hemorrhagic complication MRI  Large area of acute/subacute ischemia within the left MCA territory. No hemorrhage or mass effect 2D Echo EF 45-50% LDL 41 HgbA1c 5.5 VTE prophylaxis - SCD's    Diet   DIET - DYS 1 Room service appropriate? Yes; Fluid consistency: Honey Thick   aspirin 81 mg daily and clopidogrel 75 mg daily prior to admission, Will continue this at this time. Therapy recommendations: [facility], however they have declined today; SNF choice is being worked on by case mgt; they are aware he will need IRL prior to dc if no Afib seen on tele.  Disposition:  pending   Hypertension Home meds:  cozaar, imdur. Metoprolol Stable SBP goal <160 Home meds on hold  Long-term BP goal normotensive  Hyperlipidemia Home meds:  atorvastatin,  held in hospital due to elevated AST/ALT LDL 41, goal < 70 High intensity statin not indicated due to below goal  Monitor liver function, restart if appropriate in out pt setting  Recent NSTEMI (No stenting) Restarted ASA/Plavix   Other Stroke Risk Factors Advanced Age >/= 54  Previous Cigarette smoker  Substance abuse - UDS:  THC  Patient advised to stop using due to stroke risk. Coronary artery disease Migraines Congestive heart failure  Other Active Problems Dysphagia  Leukocytosis now improved, 14.4 today. CXR with no acute process. Monitor  Hospital day # 2  Desiree Metzger-Cihelka, ARNP-C, ANVP-BC Pager: [phone]   I have personally obtained history,examined this patient, reviewed notes, independently viewed imaging studies, participated in medical decision making and plan of care.ROS completed by me personally and pertinent positives fully documented  I have made any additions or clarifications  directly to the above note. Agree with note above.  Patient continues to have right hemiparesis and peripheral vision loss.  Continue mobilization out of bed and ongoing physical occupational and speech therapy will likely need rehabilitation in the skilled nursing facility.  Loop recorder at discharge to look for paroxysmal A-fib.  Long discussion with patient and wife at the bedside and answered questions.  Greater than 50% time during this 35-minute visit was spent in counseling and coordination of care and discussion patient and care team and answering questions.  Delia Heady, MD Medical Director [facility] Pager: [phone] 12/12/2021 2:16 PM   To contact Stroke Continuity provider, please refer to [email or web address]. After hours, contact General Neurology

## 2021-12-12 NOTE — Progress Notes (Signed)
Speech Language Pathology Treatment: Dysphagia;Cognitive-Linquistic  Patient Details Name: Terry Mayo MRN: [id number] DOB: 1955/05/05 Today's Date: 12/12/2021 Time: 3329-5188 SLP Time Calculation (min) (ACUTE ONLY): 20 min  Assessment / Plan / Recommendation Clinical Impression  Pt alert and eager to participate in skilled interventions this am. Prior to POs, trained pt in tight labial seal around cup/spoon to assist in eliminating anterior loss/clearing bolus from spoon. Additionally, educated in performance of lingual sweep and x2 swallows per bite/sip in order to clear oropharynx of likely residuals noted via MBS (8/17). Pt successful with swallow strategies in 90% of trials. No anterior loss of HTL or solids noted, puree/simulated dys 2 solids cleared from spoon and R buccal cavity. X3 instances of brief immediate throat clear exhibited with cup sips of HTL when pt was less attentive to performance of repeat dry swallow. Recommend continue dys 1 diet/HTL with adherence to swallow precautions/strategies.   Speech in simple conversation during session was 60-70% intelligible. Post instruction in slow rate of speech and over articulation, pt produced words/short phrases with 100% intelligibility requiring mod consistent verbal cues. Bilabial sounds (/b/, /p/) required increased over articulation to produce clearly due to oral motor weakness. Pt very motivated to perform strategies and participate in skilled therapy. Will continue f/u.    HPI HPI: Patient is a 66 y.o. male with PMH: HTN, HLD, former smoker, recent NSTEMI s/p cath (12/04/21). He was reportedly doing well since cath procedure until he developed acute onset right sided weakness and dysarthria. He presented to the ED for evaluation and was administered TNK. CT head did not show any acute intracranial abnormality but CT angio head neck showed definite opacification of the left MCA and its branches; Infarct core in the left MCA territory. MRI  pending. He was intubted on 12/10/21 for thrombectomy at 0130 and extubated same day 0938.      SLP Plan  Continue with current plan of care      Recommendations for follow up therapy are one component of a multi-disciplinary discharge planning process, led by the attending physician.  Recommendations may be updated based on patient status, additional functional criteria and insurance authorization.    Recommendations  Diet recommendations: Honey-thick liquid;Dysphagia 1 (puree) Liquids provided via: Cup Medication Administration: Crushed with puree Supervision: Staff to assist with self feeding;Intermittent supervision to cue for compensatory strategies Compensations: Slow rate;Small sips/bites;Lingual sweep for clearance of pocketing;Multiple dry swallows after each bite/sip Postural Changes and/or Swallow Maneuvers: Seated upright 90 degrees                Oral Care Recommendations: Oral care BID Follow Up Recommendations: Acute inpatient rehab (3hours/day) Assistance recommended at discharge: Frequent or constant Supervision/Assistance SLP Visit Diagnosis: Dysphagia, oropharyngeal phase (R13.12);Dysarthria and anarthria (R47.1) Plan: Continue with current plan of care            Avie Echevaria, MA, CCC-SLP Acute Rehabilitation Services Office Number: [phone]  Paulette Blanch  12/12/2021, 11:20 AM

## 2021-12-12 NOTE — Care Management Important Message (Signed)
Important Message  Patient Details  Name: Terry Mayo MRN: [id number] Date of Birth: Sep 26, 1955   Medicare Important Message Given:  Yes     Dorena Bodo 12/12/2021, 2:01 PM

## 2021-12-12 NOTE — Progress Notes (Signed)
Patient has 12 beat run of wide QRS or slow tachycardia. Patient asymptomatic, MD is aware

## 2021-12-12 NOTE — TOC Initial Note (Signed)
Transition of Care [facility]) - Initial/Assessment Note    Patient Details  Name: Terry Mayo MRN: [id number] Date of Birth: 07-29-1955  [facility] CM/SW Contact:    Geralynn Ochs, LCSW Phone Number: 12/12/2021, 4:04 PM  Clinical Narrative:          CSW met with patient and significant other at bedside to discuss SNF options. Patient pointed at significant other to hand bed offers, and CSW answered questions. Patient and family to review SNF options and determine choice. CSW to follow.      Expected Discharge Plan: Skilled Nursing Facility Barriers to Discharge: Continued Medical Work up, Ship broker   Patient Goals and CMS Choice Patient states their goals for this hospitalization and ongoing recovery are:: to get back home CMS Medicare.gov Compare Post Acute Care list provided to:: Patient Choice offered to / list presented to : Patient, Spouse  Expected Discharge Plan and Services Expected Discharge Plan: [place] Acute Care Choice: [place] Living arrangements for the past 2 months: Single Family Home                                      Prior Living Arrangements/Services Living arrangements for the past 2 months: Single Family Home Lives with:: Spouse Patient language and need for interpreter reviewed:: No Do you feel safe going back to the place where you live?: Yes      Need for Family Participation in Patient Care: No (Comment) Care giver support system in place?: Yes (comment)   Criminal Activity/Legal Involvement Pertinent to Current Situation/Hospitalization: No - Comment as needed  Activities of Daily Living Home Assistive Devices/Equipment: None ADL Screening (condition at time of admission) Patient's cognitive ability adequate to safely complete daily activities?: Yes Is the patient deaf or have difficulty hearing?: No Does the patient have difficulty seeing, even when wearing  glasses/contacts?: No Does the patient have difficulty concentrating, remembering, or making decisions?: Yes Patient able to express need for assistance with ADLs?: Yes Does the patient have difficulty dressing or bathing?: Yes Independently performs ADLs?: No Communication: Independent Dressing (OT): Needs assistance Does the patient have difficulty walking or climbing stairs?: No Weakness of Legs: None Weakness of Arms/Hands: Right  Permission Sought/Granted Permission sought to share information with : Facility Sport and exercise psychologist, Family Supports Permission granted to share information with : Yes, Verbal Permission Granted  Share Information with NAME: [place]  Permission granted to share info w AGENCY: SNF  Permission granted to share info w Relationship: Significant other     Emotional Assessment Appearance:: Appears stated age Attitude/Demeanor/Rapport: Engaged Affect (typically observed): Appropriate Orientation: : Oriented to Self, Oriented to Place, Oriented to  Time, Oriented to Situation Alcohol / Substance Use: Not Applicable Psych Involvement: No (comment)  Admission diagnosis:  Status post stroke [Z86.73] Acute stroke due to occlusion of left middle cerebral artery (HCC) [I63.512] Acute left ICA ischemic stroke [facility]) [I63.232] Patient Active Problem List   Diagnosis Date Noted   Status post stroke 12/10/2021   Acute left ICA ischemic stroke ([place]) 12/10/2021   CAD (coronary artery disease) 12/05/2021   Ischemic cardiomyopathy 12/05/2021   NSVT (nonsustained ventricular tachycardia) ([place]) 12/05/2021   Hypertension 12/05/2021   Hyperlipidemia 12/05/2021   NSTEMI (non-ST elevated myocardial infarction) ([place]) 12/04/2021   Colon cancer screening    Loss of weight 09/04/2015   Encounter for screening  colonoscopy 09/04/2015   PCP:  [facility] [place]:   [facility] [address], [place] - [place] [address] [place]  [place] [address] Phone: [phone] Fax: [phone]     Social Determinants of Health (SDOH) Interventions    Readmission Risk Interventions     No data to display

## 2021-12-12 NOTE — Progress Notes (Signed)
Physical Therapy Treatment Patient Details Name: Terry Mayo MRN: [id number] DOB: 12/23/1955 Today's Date: 12/12/2021   History of Present Illness Pt is a 66 y/o male admitted 8/15 with acute onset of right-sided weakness and dysarthria.  MRI 8/17-Large area of acute/subacute ischemia within the left MCA territory.  PMHx HTN, HLD, former smoker, recent NSTEMI, s/p cath 12/04/2021    PT Comments    Patient progressing well towards PT goals. Session focused on neuromuscular re-education, attending to right side and overall functional mobility. Tolerated gait training with Min A working on attending to right environment however pt with difficulty without max cues. Noted to have RLE weakness/instability but no knee buckling noted. Worked on functional sit to stand transfers emphasizing WB through RUE/RLE during transitions. Unaware of secretions out of right side of mouth due to impaired sensation. Continues to be a great AIR candidate.  Will follow.   Recommendations for follow up therapy are one component of a multi-disciplinary discharge planning process, led by the attending physician.  Recommendations may be updated based on patient status, additional functional criteria and insurance authorization.  Follow Up Recommendations  Acute inpatient rehab (3hours/day)     Assistance Recommended at Discharge Frequent or constant Supervision/Assistance  Patient can return home with the following A little help with walking and/or transfers;A little help with bathing/dressing/bathroom;Assistance with cooking/housework;Assist for transportation;Help with stairs or ramp for entrance   Equipment Recommendations  Other (comment) (defer to next venue)    Recommendations for Other Services       Precautions / Restrictions Precautions Precautions: Fall Restrictions Weight Bearing Restrictions: No     Mobility  Bed Mobility Overal bed mobility: Needs Assistance Bed Mobility: Supine to Sit      Supine to sit: Min assist, HOB elevated Sit to supine: Min guard, HOB elevated   General bed mobility comments: Assist with trunk and for cues to attend to RUE as pt rolling on top of right arm without awareness.    Transfers Overall transfer level: Needs assistance Equipment used: None Transfers: Sit to/from Stand Sit to Stand: Min assist, Min guard           General transfer comment: Cues to place RUE on RLE with LUE and worked on standing multiple bouts from EOB emphasing slow descent and equal WB through BLEs. Min gaurd-Min A for safety/support.    Ambulation/Gait Ambulation/Gait assistance: Min assist Gait Distance (Feet): 120 Feet Assistive device: None Gait Pattern/deviations: Step-through pattern, Decreased weight shift to right, Decreased stance time - right   Gait velocity interpretation: <1.31 ft/sec, indicative of household ambulator   General Gait Details: Slow, mildly unsteady gait with some listing/drifing towards right esp when  fatigued. RUE hanging heavy. Incoordination noted with RLE with some mild hyperextension during stance. Difficulty attending to right environment despite cues and reading signs.   Stairs             Wheelchair Mobility    Modified Rankin (Stroke Patients Only) Modified Rankin (Stroke Patients Only) Pre-Morbid Rankin Score: No symptoms Modified Rankin: Moderately severe disability     Balance Overall balance assessment: Needs assistance Sitting-balance support: Feet supported, No upper extremity supported Sitting balance-Leahy Scale: Fair Sitting balance - Comments: Able to reach outside BoS and adjust socks without LOB   Standing balance support: During functional activity Standing balance-Leahy Scale: Fair Standing balance comment: Able to stand statically with close min guard- needs external support for walking  Cognition Arousal/Alertness: Awake/alert Behavior During Therapy:  WFL for tasks assessed/performed Overall Cognitive Status: Impaired/Different from baseline Area of Impairment: Attention, Memory, Safety/judgement, Following commands, Awareness, Problem solving                   Current Attention Level: Sustained Memory: Decreased short-term memory Following Commands: Follows one step commands with increased time, Follows multi-step commands inconsistently Safety/Judgement: Decreased awareness of safety, Decreased awareness of deficits Awareness: Intellectual Problem Solving: Slow processing, Requires verbal cues General Comments: Decreased awareness of Rt side and overall deficits, requiring max cuing for safety and awareness.        Exercises Other Exercises Other Exercises: Sit to stand x6 from EOB    General Comments        Pertinent Vitals/Pain Pain Assessment Pain Assessment: No/denies pain    Home Living                          Prior Function            PT Goals (current goals can now be found in the care plan section) Progress towards PT goals: Progressing toward goals    Frequency    Min 4X/week      PT Plan Current plan remains appropriate    Co-evaluation              AM-PAC PT "6 Clicks" Mobility   Outcome Measure  Help needed turning from your back to your side while in a flat bed without using bedrails?: A Little Help needed moving from lying on your back to sitting on the side of a flat bed without using bedrails?: A Little Help needed moving to and from a bed to a chair (including a wheelchair)?: A Little Help needed standing up from a chair using your arms (e.g., wheelchair or bedside chair)?: A Little Help needed to walk in hospital room?: A Little Help needed climbing 3-5 steps with a railing? : Total 6 Click Score: 16    End of Session Equipment Utilized During Treatment: Gait belt Activity Tolerance: Patient tolerated treatment well Patient left: in bed;with call bell/phone  within reach;with bed alarm set;Other (comment) (MD present) Nurse Communication: Mobility status PT Visit Diagnosis: Other abnormalities of gait and mobility (R26.89);Hemiplegia and hemiparesis Hemiplegia - Right/Left: Right Hemiplegia - dominant/non-dominant: Dominant Hemiplegia - caused by: Cerebral infarction     Time: 1122-1140 PT Time Calculation (min) (ACUTE ONLY): 18 min  Charges:  $Neuromuscular Re-education: 8-22 mins                     Vale Haven, PT, DPT Acute Rehabilitation Services Secure chat preferred Office [phone]      Blake Divine A Sriya Kroeze 12/12/2021, 12:25 PM

## 2021-12-13 DIAGNOSIS — I25119 Atherosclerotic heart disease of native coronary artery with unspecified angina pectoris: Secondary | ICD-10-CM | POA: Diagnosis not present

## 2021-12-13 DIAGNOSIS — I214 Non-ST elevation (NSTEMI) myocardial infarction: Secondary | ICD-10-CM | POA: Diagnosis not present

## 2021-12-13 DIAGNOSIS — E7800 Pure hypercholesterolemia, unspecified: Secondary | ICD-10-CM

## 2021-12-13 DIAGNOSIS — I63232 Cerebral infarction due to unspecified occlusion or stenosis of left carotid arteries: Secondary | ICD-10-CM

## 2021-12-13 LAB — HEPATIC FUNCTION PANEL
ALT: 65 U/L — ABNORMAL HIGH (ref 0–44)
AST: 41 U/L (ref 15–41)
Albumin: 3 g/dL — ABNORMAL LOW (ref 3.5–5.0)
Alkaline Phosphatase: 68 U/L (ref 38–126)
Bilirubin, Direct: 0.2 mg/dL (ref 0.0–0.2)
Indirect Bilirubin: 0.5 mg/dL (ref 0.3–0.9)
Total Bilirubin: 0.7 mg/dL (ref 0.3–1.2)
Total Protein: 6.2 g/dL — ABNORMAL LOW (ref 6.5–8.1)

## 2021-12-13 LAB — GLUCOSE, CAPILLARY
Glucose-Capillary: 101 mg/dL — ABNORMAL HIGH (ref 70–99)
Glucose-Capillary: 108 mg/dL — ABNORMAL HIGH (ref 70–99)
Glucose-Capillary: 110 mg/dL — ABNORMAL HIGH (ref 70–99)
Glucose-Capillary: 94 mg/dL (ref 70–99)
Glucose-Capillary: 97 mg/dL (ref 70–99)
Glucose-Capillary: 98 mg/dL (ref 70–99)

## 2021-12-13 LAB — CBC
HCT: 41.3 % (ref 39.0–52.0)
Hemoglobin: 15 g/dL (ref 13.0–17.0)
MCH: 33.6 pg (ref 26.0–34.0)
MCHC: 36.3 g/dL — ABNORMAL HIGH (ref 30.0–36.0)
MCV: 92.4 fL (ref 80.0–100.0)
Platelets: 155 10*3/uL (ref 150–400)
RBC: 4.47 MIL/uL (ref 4.22–5.81)
RDW: 13.9 % (ref 11.5–15.5)
WBC: 12.2 10*3/uL — ABNORMAL HIGH (ref 4.0–10.5)
nRBC: 0 % (ref 0.0–0.2)

## 2021-12-13 LAB — BASIC METABOLIC PANEL
Anion gap: 11 (ref 5–15)
BUN: 18 mg/dL (ref 8–23)
CO2: 22 mmol/L (ref 22–32)
Calcium: 8.7 mg/dL — ABNORMAL LOW (ref 8.9–10.3)
Chloride: 107 mmol/L (ref 98–111)
Creatinine, Ser: 1.18 mg/dL (ref 0.61–1.24)
GFR, Estimated: >60 mL/min (ref >60)
Glucose, Bld: 102 mg/dL — ABNORMAL HIGH (ref 70–99)
Potassium: 4.1 mmol/L (ref 3.5–5.1)
Sodium: 140 mmol/L (ref 135–145)

## 2021-12-13 MED ORDER — ATORVASTATIN CALCIUM 40 MG PO TABS
40.0000 mg | ORAL_TABLET | Freq: Every day | ORAL | Status: DC
  Administered 2021-12-13 – 2021-12-16 (×4): 40 mg via ORAL
  Filled 2021-12-13 (×4): qty 1

## 2021-12-13 NOTE — Progress Notes (Addendum)
STROKE TEAM PROGRESS NOTE   ATTENDING NOTE: I reviewed above note and agree with the assessment and plan. Pt was seen and examined.   66 year old male with history of hypertension, hyperlipidemia, former smoker, CAD, CHF, NSTEMI 8/10 admitted for slurred speech, right-sided weakness, confusion.  CT negative.  Status post TNK.  CTA head and neck distal left ICA and left MCA occlusion.  Status post IR with TICI3 reperfusion.  Post IR remained intubated due to epistaxis but soon extubated.  MRI showed left MCA large infarct.  EF 45 to 50%, LDL 41, A1c 5.5.  UDS positive for THC.  WBC 17.0->14.4->12.2.  Creatinine 1.22->1.18.  AST/ALT 63/85->41/65.  On exam, patient lying in bed, awake, alert, eyes open, orientated to age, place, time. No aphasia, paucity of speech but able to answer questions appropriately, following all simple commands. Able to name and repeat. Mild dysarthria. Left gaze preference and right gaze incomplete, tracking bilaterally, visual field full. Right facial droop. Tongue protrusion to the right. LUE 5/5, no drift, RUE proximal 1/5, bicep 2/5, tricep and finger grip 0/5. Bilaterally LEs 4/5, no drift. Sensation symmetrical bilaterally, left FTN intact, gait not tested.  Etiology for patient stroke likely due to large vessel disease given risk factors.  However cardioembolic source cannot be ruled out.  Recommend loop recorder versus 30-day CardioNet monitoring on discharge.  Currently on aspirin 81 and Plavix 75 DAPT for CAD and non-STEMI.  Resume Lipitor 40 as LFTs improved.  Still on honey thick liquid, continue IV fluid at 50.  PT/OT recommend SNF.  Stroke risk factor modification.  For detailed assessment and plan, please refer to above/below as I have made changes wherever appropriate.   Marvel Plan, MD PhD Stroke Neurology 12/13/2021 5:14 PM    INTERVAL HISTORY Slowly improving, advancing modified diet per SLP. He has no current complaint and no new issues. He is in good  spirits and looking forward to rehab to improve. If no afib seen on tele before dc, IRL will be needed. Spoke with Case Mgt, IPR said no, so now looking for SNF. Will not dc till Mon or Tue most likely.  Vitals:   12/12/21 1705 12/12/21 2014 12/12/21 2350 12/13/21 0444  BP: 136/80 127/76 139/86 (!) 135/92  Pulse: 62 62 63 66  Resp: 16 16 17 17   Temp:  98.2 F (36.8 C) 98.6 F (37 C)   TempSrc:  Oral Oral   SpO2: 100% 99% 100%   Weight:      Height:       CBC:  Recent Labs  Lab 12/09/21 2235 12/10/21 0350 12/12/21 0308 12/13/21 0204  WBC 11.1*   < > 14.4* 12.2*  NEUTROABS 4.6  --   --   --   HGB 15.8   < > 13.8 15.0  HCT 45.0   < > 39.5 41.3  MCV 93.2   < > 92.3 92.4  PLT 196   < > 170 155   < > = values in this interval not displayed.    Basic Metabolic Panel:  Recent Labs  Lab 12/12/21 0308 12/13/21 0204  NA 139 140  K 3.9 4.1  CL 110 107  CO2 22 22  GLUCOSE 117* 102*  BUN 19 18  CREATININE 1.22 1.18  CALCIUM 8.3* 8.7*    Lipid Panel:  Recent Labs  Lab 12/11/21 0612  TRIG 58    HgbA1c:  No results for input(s): "HGBA1C" in the last 168 hours.  Urine Drug Screen:  Recent  Labs  Lab 12/10/21 0916  LABOPIA NONE DETECTED  COCAINSCRNUR NONE DETECTED  LABBENZ NONE DETECTED  AMPHETMU NONE DETECTED  THCU POSITIVE*  LABBARB NONE DETECTED     Alcohol Level  Recent Labs  Lab 12/09/21 2235  ETH <10     IMAGING past 24 hours No results found.  PHYSICAL EXAM  Temp:  [97.8 F (36.6 C)-98.6 F (37 C)] 98.6 F (37 C) (08/18 2350) Pulse Rate:  [61-66] 66 (08/19 0444) Resp:  [16-18] 17 (08/19 0444) BP: (127-139)/(76-92) 135/92 (08/19 0444) SpO2:  [99 %-100 %] 100 % (08/18 2350)  General - Well nourished, well developed, middle-aged African-American male in no apparent distress. Resp: Lungs clear, breathing rate normal Cardiovascular - Regular rhythm and rate.  NEURO: Patient is awake alert and interactive, good attention and pleasant affect>  Briskly following commands, oriented x 4, still with mild-mod dysarthria, mild aphasia. Left gaze preference, but is more midline and can now cross midline briefly with much coaching. Right hemianopia, Right arm with some movement can lift right leg against gravity, decrease sensation on right. Left side normal strength. Normal strength and sensation on the left.  ASSESSMENT/PLAN Mr. DANYEL GRIESS is a 66 y.o. male with history of hypertension, hyperlipidemia, former smoking, recent NSTEMI s/p cath 12/04/2021 (medical management, no PCI). On 8/15 he presented to the ED when he developed acute onset right-sided weakness and dysarthria. He received IV TNK and then thrombectomy with Dr. Joana Reamer Rodrgiuez for Left ICA terminus occulsion with TICI 3.  Stroke: Acute Left MCA ischemic infarct  s/p IV TNK and thrombectomy of left ICA terminus occlusion with TICI 3 revascularization by Dr. Joana Reamer Rodrgiuez Etiology:  cardioembolic source to be determined Code Stroke CT head No acute abnormality. ASPECTS 10.    CTA head & neck with CT perfusion- 1. Occlusion of the distal left ICA (series 6, image 97), with no definite opacification of the left MCA and its branches. 2. Infarct core in the left MCA territory measures 69 mL, with penumbra measuring 359 mL with a mismatch volume of 290 mL. 3. No other hemodynamically significant stenosis in the head or neck. Cerebral angio Left internal carotid artery angiogram showed recanalization of the left ICA terminus with patent but stenotic proximal left M1/MCA related to residual thrombus with slow distal flow (TICI 2C Post IR CT -1. Successful mechanical thrombectomy for treatment of a left ICA terminus occlusion with direct contact aspiration achieving complete recanalization (TICI 3). 2. No thromboembolic or hemorrhagic complication MRI  Large area of acute/subacute ischemia within the left MCA territory. No hemorrhage or mass effect 2D Echo EF 45-50% LDL 41 HgbA1c  5.5 VTE prophylaxis - SCD's    Diet   DIET - DYS 1 Room service appropriate? Yes; Fluid consistency: Honey Thick   aspirin 81 mg daily and clopidogrel 75 mg daily prior to admission, Will continue this at this time, unless Afib seen, then recommend Eliquis 5mg  BID (81mg  ASA if indicated by cardiology, but  would not recommendt triple therapy) Therapy recommendations: SNF choice is being worked on by case mgt; they are aware he will need IRL prior to dc if no Afib seen on tele.  Disposition:  Mon or Tue is the soonest  Hypertension Home meds:  cozaar, imdur. Metoprolol Stable SBP goal <160 Home meds on hold  Long-term BP goal normotensive  Hyperlipidemia Home meds:  atorvastatin,  held in hospital due to elevated AST/ALT LDL 41, goal < 70 High intensity statin not indicated  due to below goal  Monitor liver function, restart if appropriate in out pt setting  Recent NSTEMI (No stenting) Restarted ASA/Plavix   Other Stroke Risk Factors Advanced Age >/= 44  Previous Cigarette smoker  Substance abuse - UDS:  THC  Patient advised to stop using due to stroke risk. Coronary artery disease Migraines Congestive heart failure  Other Active Problems Dysphagia  Leukocytosis now improving, 12 today. CXR with no acute process. Monitor  Hospital day # 3  Desiree Metzger-Cihelka, ARNP-C, ANVP-BC Pager: [phone]    To contact Stroke Continuity provider, please refer to [email or web address] After hours, contact General Neurology

## 2021-12-13 NOTE — Progress Notes (Signed)
Physical Therapy Treatment Patient Details Name: Terry Mayo MRN: [id number] DOB: [birth year] Today's Date: 12/13/2021   History of Present Illness Pt is a 66 y/o male admitted 8/15 with acute onset of right-sided weakness and dysarthria.  MRI 8/17-Large area of acute/subacute ischemia within the left MCA territory.  PMHx HTN, HLD, former smoker, recent NSTEMI, s/p cath 12/04/2021    PT Comments    Pt progressing towards physical therapy goals. Was able to perform transfers and ambulation with min-mod assist and RW for support. Pt may benefit from saddle splint for R hand on walker for positioning. Therapist assist for hand placement and walker management this session. Pt eating lunch when PT arrived - cues throughout to attend to the R side, utilizing the mirror to identify anterior spillage and to check for pocketing on the R side. Pt eating very slowly and reports it is difficult to swallow. No coughing noted. Recommendations updated to reflect pt/family request for SNF level rehab closer to [place]. Will continue to follow.    Recommendations for follow up therapy are one component of a multi-disciplinary discharge planning process, led by the attending physician.  Recommendations may be updated based on patient status, additional functional criteria and insurance authorization.  Follow Up Recommendations  Skilled nursing-short term rehab (<3 hours/day)     Assistance Recommended at Discharge Frequent or constant Supervision/Assistance  Patient can return home with the following A little help with walking and/or transfers;A little help with bathing/dressing/bathroom;Assistance with cooking/housework;Assist for transportation;Help with stairs or ramp for entrance   Equipment Recommendations  Other (comment) (defer to next venue)    Recommendations for Other Services Rehab consult     Precautions / Restrictions Precautions Precautions: Fall Restrictions Weight Bearing Restrictions:  No     Mobility  Bed Mobility Overal bed mobility: Needs Assistance Bed Mobility: Supine to Sit     Supine to sit: Min assist, HOB elevated     General bed mobility comments: Assist for RUE management/positioning. VC's throughout to attend to the R side. Pt with heavy use of rails to roll R and transition fully to EOB.    Transfers Overall transfer level: Needs assistance Equipment used: Rolling walker (2 wheels) Transfers: Sit to/from Stand Sit to Stand: Min assist           General transfer comment: Assist for management of RUE. Placed R hand on walker first and pt pushed up to stand with LUE on the bed. Assist for balance support and safety.    Ambulation/Gait Ambulation/Gait assistance: Min assist, Mod assist Gait Distance (Feet): 200 Feet Assistive device: Rolling walker (2 wheels) Gait Pattern/deviations: Step-through pattern, Decreased weight shift to right, Decreased stance time - right, Drifts right/left, Trunk flexed Gait velocity: Decreased Gait velocity interpretation: <1.8 ft/sec, indicate of risk for recurrent falls   General Gait Details: VC's for imrpoved posture, closer walker proximity, and forward gaze. Slow but generally steady with the RW for support. Assist for walker management (mod assist as pt fatigued), and to keep R hand on the walker. Pt may benefit from a saddle splint for the walker to help keep R hand positioned properly.   Stairs             Wheelchair Mobility    Modified Rankin (Stroke Patients Only) Modified Rankin (Stroke Patients Only) Pre-Morbid Rankin Score: No symptoms Modified Rankin: Moderately severe disability     Balance Overall balance assessment: Needs assistance Sitting-balance support: Feet supported, No upper extremity supported Sitting balance-Leahy Scale: Fair  Standing balance support: During functional activity Standing balance-Leahy Scale: Fair Standing balance comment: Able to stand statically  with close min guard- needs external support for walking                            Cognition Arousal/Alertness: Awake/alert Behavior During Therapy: WFL for tasks assessed/performed Overall Cognitive Status: Impaired/Different from baseline Area of Impairment: Attention, Memory, Safety/judgement, Following commands, Awareness, Problem solving                   Current Attention Level: Sustained Memory: Decreased short-term memory Following Commands: Follows one step commands with increased time, Follows multi-step commands inconsistently Safety/Judgement: Decreased awareness of safety, Decreased awareness of deficits Awareness: Intellectual Problem Solving: Slow processing, Requires verbal cues General Comments: Decreased awareness of Rt side and overall deficits, requiring cueing for safety and awareness but overall improved.        Exercises      General Comments        Pertinent Vitals/Pain Pain Assessment Pain Assessment: No/denies pain Faces Pain Scale: No hurt    Home Living                          Prior Function            PT Goals (current goals can now be found in the care plan section) Acute Rehab PT Goals Patient Stated Goal: pt didn't state PT Goal Formulation: Patient unable to participate in goal setting Time For Goal Achievement: 12/24/21 Potential to Achieve Goals: Good Progress towards PT goals: Progressing toward goals    Frequency    Min 4X/week      PT Plan Current plan remains appropriate    Co-evaluation              AM-PAC PT "6 Clicks" Mobility   Outcome Measure  Help needed turning from your back to your side while in a flat bed without using bedrails?: A Little Help needed moving from lying on your back to sitting on the side of a flat bed without using bedrails?: A Little Help needed moving to and from a bed to a chair (including a wheelchair)?: A Little Help needed standing up from a chair  using your arms (e.g., wheelchair or bedside chair)?: A Little Help needed to walk in hospital room?: A Little Help needed climbing 3-5 steps with a railing? : Total 6 Click Score: 16    End of Session Equipment Utilized During Treatment: Gait belt Activity Tolerance: Patient tolerated treatment well Patient left: in chair;with call bell/phone within reach;with chair alarm set Nurse Communication: Mobility status PT Visit Diagnosis: Other abnormalities of gait and mobility (R26.89);Hemiplegia and hemiparesis Hemiplegia - Right/Left: Right Hemiplegia - dominant/non-dominant: Dominant Hemiplegia - caused by: Cerebral infarction     Time: 1356-1435 PT Time Calculation (min) (ACUTE ONLY): 39 min  Charges:  $Gait Training: 8-22 mins $Therapeutic Activity: 8-22 mins $Self Care/Home Management: 8-22                     Conni Slipper, PT, DPT Acute Rehabilitation Services Secure Chat Preferred Office: [phone]    Marylynn Pearson 12/13/2021, 2:53 PM

## 2021-12-13 NOTE — Progress Notes (Signed)
Per RN, pt requesting ice chips. Reviewed pt dysphagia hx and encounters with speech therapy this admission. Pt okay for ice chips in between meals following diligent oral care and supervision with staff as tolerated. SLP team to continue to follow for dysphagia management.   Ardyth Gal MA, CCC-SLP Acute Rehabilitation Services

## 2021-12-14 DIAGNOSIS — I63232 Cerebral infarction due to unspecified occlusion or stenosis of left carotid arteries: Secondary | ICD-10-CM | POA: Diagnosis not present

## 2021-12-14 DIAGNOSIS — I63512 Cerebral infarction due to unspecified occlusion or stenosis of left middle cerebral artery: Secondary | ICD-10-CM | POA: Diagnosis not present

## 2021-12-14 DIAGNOSIS — I214 Non-ST elevation (NSTEMI) myocardial infarction: Secondary | ICD-10-CM | POA: Diagnosis not present

## 2021-12-14 DIAGNOSIS — R1312 Dysphagia, oropharyngeal phase: Secondary | ICD-10-CM

## 2021-12-14 LAB — BASIC METABOLIC PANEL
Anion gap: 6 (ref 5–15)
BUN: 16 mg/dL (ref 8–23)
CO2: 19 mmol/L — ABNORMAL LOW (ref 22–32)
Calcium: 8.4 mg/dL — ABNORMAL LOW (ref 8.9–10.3)
Chloride: 110 mmol/L (ref 98–111)
Creatinine, Ser: 1.04 mg/dL (ref 0.61–1.24)
GFR, Estimated: >60 mL/min (ref >60)
Glucose, Bld: 105 mg/dL — ABNORMAL HIGH (ref 70–99)
Potassium: 4 mmol/L (ref 3.5–5.1)
Sodium: 135 mmol/L (ref 135–145)

## 2021-12-14 LAB — CBC
HCT: 38.9 % — ABNORMAL LOW (ref 39.0–52.0)
Hemoglobin: 13.9 g/dL (ref 13.0–17.0)
MCH: 32.4 pg (ref 26.0–34.0)
MCHC: 35.7 g/dL (ref 30.0–36.0)
MCV: 90.7 fL (ref 80.0–100.0)
Platelets: 186 10*3/uL (ref 150–400)
RBC: 4.29 MIL/uL (ref 4.22–5.81)
RDW: 13.4 % (ref 11.5–15.5)
WBC: 10.9 10*3/uL — ABNORMAL HIGH (ref 4.0–10.5)
nRBC: 0 % (ref 0.0–0.2)

## 2021-12-14 LAB — GLUCOSE, CAPILLARY
Glucose-Capillary: 103 mg/dL — ABNORMAL HIGH (ref 70–99)
Glucose-Capillary: 107 mg/dL — ABNORMAL HIGH (ref 70–99)
Glucose-Capillary: 115 mg/dL — ABNORMAL HIGH (ref 70–99)
Glucose-Capillary: 117 mg/dL — ABNORMAL HIGH (ref 70–99)
Glucose-Capillary: 117 mg/dL — ABNORMAL HIGH (ref 70–99)
Glucose-Capillary: 124 mg/dL — ABNORMAL HIGH (ref 70–99)
Glucose-Capillary: 126 mg/dL — ABNORMAL HIGH (ref 70–99)

## 2021-12-14 MED ORDER — ENOXAPARIN SODIUM 40 MG/0.4ML IJ SOSY
40.0000 mg | PREFILLED_SYRINGE | INTRAMUSCULAR | Status: DC
  Administered 2021-12-14 – 2021-12-15 (×2): 40 mg via SUBCUTANEOUS
  Filled 2021-12-14 (×2): qty 0.4

## 2021-12-14 NOTE — Progress Notes (Addendum)
STROKE TEAM PROGRESS NOTE   INTERVAL HISTORY Slowly improving, advancing modified diet per SLP. He has no current complaint and no new issues. If no afib seen on tele before dc, IRL will be needed. Spoke with Case Mgt, awaiting SNF bed at this time.  Vitals:   12/13/21 1626 12/13/21 1938 12/13/21 2338 12/14/21 0417  BP: 126/72 117/80 120/65 133/73  Pulse: 66 65 61 62  Resp: 20 18 17 17   Temp: 99 F (37.2 C) 97.8 F (36.6 C) 98.1 F (36.7 C) 98.4 F (36.9 C)  TempSrc: Oral Oral Oral Oral  SpO2: 100% 98% 99% 99%  Weight:      Height:       CBC:  Recent Labs  Lab 12/09/21 2235 12/10/21 0350 12/13/21 0204 12/14/21 0613  WBC 11.1*   < > 12.2* 10.9*  NEUTROABS 4.6  --   --   --   HGB 15.8   < > 15.0 13.9  HCT 45.0   < > 41.3 38.9*  MCV 93.2   < > 92.4 90.7  PLT 196   < > 155 186   < > = values in this interval not displayed.    Basic Metabolic Panel:  Recent Labs  Lab 12/13/21 0204 12/14/21 0613  NA 140 135  K 4.1 4.0  CL 107 110  CO2 22 19*  GLUCOSE 102* 105*  BUN 18 16  CREATININE 1.18 1.04  CALCIUM 8.7* 8.4*    Lipid Panel:  Recent Labs  Lab 12/11/21 0612  TRIG 58    HgbA1c:  No results for input(s): "HGBA1C" in the last 168 hours.  Urine Drug Screen:  Recent Labs  Lab 12/10/21 0916  LABOPIA NONE DETECTED  COCAINSCRNUR NONE DETECTED  LABBENZ NONE DETECTED  AMPHETMU NONE DETECTED  THCU POSITIVE*  LABBARB NONE DETECTED     Alcohol Level  Recent Labs  Lab 12/09/21 2235  ETH <10     IMAGING past 24 hours No results found.  PHYSICAL EXAM  Temp:  [97.8 F (36.6 C)-99 F (37.2 C)] 98.4 F (36.9 C) (08/20 0417) Pulse Rate:  [61-89] 62 (08/20 0417) Resp:  [17-20] 17 (08/20 0417) BP: (117-140)/(65-89) 133/73 (08/20 0417) SpO2:  [98 %-100 %] 99 % (08/20 0417)  General - Well nourished, well developed, middle-aged African-American male in no apparent distress. Resp: Lungs clear, breathing rate normal Cardiovascular - Regular rhythm and  rate.  NEURO: Sitting in chair, awake, alert, eyes open, orientated to age, place, time. No aphasia, paucity of speech but able to answer questions appropriately, following all simple commands. Able to name and repeat. Mild dysarthria. Left gaze preference and right gaze incomplete, tracking bilaterally, visual field full. Right facial droop. Tongue protrusion to the right. LUE 5/5, no drift, RUE proximal 1/5, bicep 2/5, tricep and finger grip 0/5. Bilaterally LEs 4/5, no drift. Sensation symmetrical bilaterally, left FTN intact, gait not tested.  ASSESSMENT/PLAN [name] is a 66 y.o. male with history of hypertension, hyperlipidemia, former smoking, recent NSTEMI s/p cath 12/04/2021 (medical management, no PCI). On 8/15 he presented to the ED when he developed acute onset right-sided weakness and dysarthria. He received IV TNK and then thrombectomy with Dr. 9/15 Rodrgiuez for Left ICA terminus occulsion with TICI 3 result.  Stroke: Acute Left MCA ischemic infarct  due to left tICA and MCA occlusion s/p TNK and IR with TICI3. Etiology unclear, concerning for cardioembolic source CT negative.   CTA head and neck distal left ICA and  left MCA occlusion.   Status post IR with TICI3 reperfusion.   MRI showed left MCA large infarct.   2D Echo EF 45-50% Recommend loop recorder before discharge LDL 41 HgbA1c 5.5 VTE prophylaxis -Lovenox aspirin 81 mg daily and clopidogrel 75 mg daily prior to admission, Will continue DAPT at discharge Therapy recommendations: SNF Disposition: Pending  CAD NSTEMI Cardiomyopathy 8/10 non-STEMI Discharged on DAPT EF 45 to 50% Continue DAPT and statin Outpatient follow-up with cardiology  Hypertension Home meds:  cozaar, imdur. Metoprolol Stable SBP goal <160 Long-term BP goal normotensive  Hyperlipidemia Home meds:  atorvastatin 40 LDL 41, goal < 70 LFT improved, resume Lipitor 40 Continue statin on discharge  Dysphagia Speech on  board Currently on dysphagia 1 and honey thick liquid Continue gentle IV fluid at 50 Advance diet if able  Other Stroke Risk Factors Advanced Age >/= 69  Previous Cigarette smoker  Substance abuse - UDS:  THC  Patient advised to stop using due to stroke risk. Migraines  Other Active Problems Leukocytosis WBC 17.0->14.4->12.2->10.9    Hospital day # 4  Marvel Plan, MD PhD Stroke Neurology 12/14/2021 4:48 PM    To contact Stroke Continuity provider, please refer to [email or web address]. After hours, contact General Neurology

## 2021-12-14 NOTE — Progress Notes (Signed)
Voicemail left for pt's SO Drucilla requesting return call re SNF choice. [facility] for SNF started (ref # [id number]), will need to update with facility choice when known.   Dellie Burns, MSW, LCSW [phone] (coverage)

## 2021-12-15 DIAGNOSIS — I63232 Cerebral infarction due to unspecified occlusion or stenosis of left carotid arteries: Secondary | ICD-10-CM | POA: Diagnosis not present

## 2021-12-15 LAB — BASIC METABOLIC PANEL
Anion gap: 8 (ref 5–15)
BUN: 17 mg/dL (ref 8–23)
CO2: 23 mmol/L (ref 22–32)
Calcium: 8.5 mg/dL — ABNORMAL LOW (ref 8.9–10.3)
Chloride: 107 mmol/L (ref 98–111)
Creatinine, Ser: 1.13 mg/dL (ref 0.61–1.24)
GFR, Estimated: >60 mL/min (ref >60)
Glucose, Bld: 109 mg/dL — ABNORMAL HIGH (ref 70–99)
Potassium: 4 mmol/L (ref 3.5–5.1)
Sodium: 138 mmol/L (ref 135–145)

## 2021-12-15 LAB — CBC
HCT: 39.6 % (ref 39.0–52.0)
Hemoglobin: 14 g/dL (ref 13.0–17.0)
MCH: 32.6 pg (ref 26.0–34.0)
MCHC: 35.4 g/dL (ref 30.0–36.0)
MCV: 92.1 fL (ref 80.0–100.0)
Platelets: 191 10*3/uL (ref 150–400)
RBC: 4.3 MIL/uL (ref 4.22–5.81)
RDW: 13.3 % (ref 11.5–15.5)
WBC: 11.1 10*3/uL — ABNORMAL HIGH (ref 4.0–10.5)
nRBC: 0 % (ref 0.0–0.2)

## 2021-12-15 LAB — GLUCOSE, CAPILLARY
Glucose-Capillary: 111 mg/dL — ABNORMAL HIGH (ref 70–99)
Glucose-Capillary: 83 mg/dL (ref 70–99)
Glucose-Capillary: 99 mg/dL (ref 70–99)
Glucose-Capillary: 114 mg/dL — ABNORMAL HIGH (ref 70–99)
Glucose-Capillary: 121 mg/dL — ABNORMAL HIGH (ref 70–99)

## 2021-12-15 MED ORDER — DOCUSATE SODIUM 50 MG/5ML PO LIQD
100.0000 mg | Freq: Two times a day (BID) | ORAL | Status: DC
  Administered 2021-12-15 – 2021-12-16 (×2): 100 mg via ORAL
  Filled 2021-12-15 (×2): qty 10

## 2021-12-15 MED ORDER — POLYETHYLENE GLYCOL 3350 17 G PO PACK
17.0000 g | PACK | Freq: Every day | ORAL | Status: DC
  Administered 2021-12-16: 17 g via ORAL
  Filled 2021-12-15: qty 1

## 2021-12-15 NOTE — Progress Notes (Addendum)
STROKE TEAM PROGRESS NOTE   INTERVAL HISTORY Awaiting SNF placement. Contacted EP for loop placement prior to discharge to SNF. CM on board for placement. He states that he had a good night and slept well. He does move his right arm reflexively when he yawns and he does have some sensation in his right upper arm.   Vitals:   12/14/21 1946 12/14/21 2334 12/15/21 0426 12/15/21 0816  BP: 121/78 132/81 (!) 142/77 123/83  Pulse: 67 61 (!) 52 (!) 55  Resp: 17 17 17 20   Temp: 98.8 F (37.1 C) 98.1 F (36.7 C) 97.6 F (36.4 C) 98.7 F (37.1 C)  TempSrc: Oral Oral Oral Oral  SpO2: 99% 100% 97% 100%  Weight:      Height:       CBC:  Recent Labs  Lab 12/09/21 2235 12/10/21 0350 12/14/21 0613 12/15/21 0400  WBC 11.1*   < > 10.9* 11.1*  NEUTROABS 4.6  --   --   --   HGB 15.8   < > 13.9 14.0  HCT 45.0   < > 38.9* 39.6  MCV 93.2   < > 90.7 92.1  PLT 196   < > 186 191   < > = values in this interval not displayed.    Basic Metabolic Panel:  Recent Labs  Lab 12/14/21 0613 12/15/21 0400  NA 135 138  K 4.0 4.0  CL 110 107  CO2 19* 23  GLUCOSE 105* 109*  BUN 16 17  CREATININE 1.04 1.13  CALCIUM 8.4* 8.5*    Lipid Panel:  Recent Labs  Lab 12/11/21 0612  TRIG 58    HgbA1c:  No results for input(s): "HGBA1C" in the last 168 hours.  Urine Drug Screen:  Recent Labs  Lab 12/10/21 0916  LABOPIA NONE DETECTED  COCAINSCRNUR NONE DETECTED  LABBENZ NONE DETECTED  AMPHETMU NONE DETECTED  THCU POSITIVE*  LABBARB NONE DETECTED     Alcohol Level  Recent Labs  Lab 12/09/21 2235  ETH <10     IMAGING past 24 hours No results found.  PHYSICAL EXAM  Temp:  [97.6 F (36.4 C)-98.8 F (37.1 C)] 98.7 F (37.1 C) (08/21 0816) Pulse Rate:  [52-67] 55 (08/21 0816) Resp:  [17-20] 20 (08/21 0816) BP: (121-143)/(77-83) 123/83 (08/21 0816) SpO2:  [97 %-100 %] 100 % (08/21 0816)  General - Well nourished, well developed, middle-aged African-American male in no apparent  distress. Resp: Lungs clear, breathing rate normal Cardiovascular - Regular rhythm and rate.  NEURO: Sitting in chair, awake, alert, eyes open, orientated to age, place, time. No aphasia, paucity of speech but able to answer questions appropriately, following all simple commands. Able to name and repeat. Mild dysarthria. Left gaze preference and right gaze incomplete, tracking bilaterally, visual field full. Right facial droop. Tongue protrusion to the right. LUE 5/5, no drift, RUE proximal 1/5, bicep 2/5, tricep and finger grip 0/5. Bilaterally LEs 4/5, no drift. Sensation symmetrical bilaterally, left FTN intact, gait not tested.  ASSESSMENT/Terry Terry Terry is a 66 y.o. male with history of hypertension, hyperlipidemia, former smoking, recent NSTEMI s/p cath 12/04/2021 (medical management, no PCI). On 8/15 he presented to the ED when he developed acute onset right-sided weakness and dysarthria. He received IV TNK and then thrombectomy with Dr. 9/15 Terry Terry for Left ICA terminus occulsion with TICI 3 result.  Stroke: Acute Left MCA ischemic infarct  due to left tICA and MCA occlusion s/p TNK and IR with TICI3. Etiology unclear,  concerning for cardioembolic source CT negative.   CTA head and neck distal left ICA and left MCA occlusion.   Status post IR with TICI3 reperfusion.   MRI showed left MCA large infarct.   2D Echo EF 45-50% Recommend loop recorder before discharge- EP following for discharge planning LDL 41 HgbA1c 5.5 VTE prophylaxis -Lovenox aspirin 81 mg daily and clopidogrel 75 mg daily prior to admission, Will continue DAPT at discharge Therapy recommendations: SNF Disposition: Pending  CAD NSTEMI Cardiomyopathy 8/10 non-STEMI Discharged on DAPT EF 45 to 50% Continue DAPT and statin Outpatient follow-up with cardiology  Hypertension Home meds:  cozaar, imdur. Metoprolol Stable SBP goal <160 Long-term BP goal normotensive  Hyperlipidemia Home meds:   atorvastatin 40 LDL 41, goal < 70 LFT improved, resume Lipitor 40 Continue statin on discharge  Dysphagia Speech on board Currently on dysphagia 1 and honey thick liquid- SLP to continue working with him Continue gentle IV fluid at 50 Advance diet if able  Other Stroke Risk Factors Advanced Age >/= 15  Previous Cigarette smoker  Substance abuse - UDS:  THC  Patient advised to stop using due to stroke risk. Migraines  Other Active Problems Leukocytosis WBC 17.0->14.4->12.2->10.9->11.1    Hospital day # 5  Patient seen and examined by NP/APP with MD. MD to update note as needed.   Terry Picker, DNP, FNP-BC [facility] Pager: [phone]  ATTENDING NOTE: I reviewed above note and agree with the assessment and Terry. Pt was seen and examined.   No acute event overnight, neuro stable. Still has left UE plegia. Pending SNF placement. Will do loop recorder before discharge. EP aware.   For detailed assessment and Terry, please refer to above/below as I have made changes wherever appropriate.   Terry Plan, MD PhD Stroke Neurology 12/15/2021 2:20 PM     To contact Stroke Continuity provider, please refer to [email or web address]. After hours, contact General Neurology

## 2021-12-15 NOTE — Progress Notes (Signed)
Patient's insurance authorization was approved.  Patient will receive a loop procedure prior to discharge on 12/16/2021 then will discharge to [facility].  Edwin Dada, MSW, LCSW Transitions of Care  Clinical Social Worker II [phone]

## 2021-12-15 NOTE — Progress Notes (Signed)
Physical Therapy Treatment Patient Details Name: Terry Mayo MRN: [id number] DOB: 02-Sep-1955 Today's Date: 12/15/2021   History of Present Illness Pt is a 66 y/o male admitted 8/15 with acute onset of right-sided weakness and dysarthria.  MRI 8/17-Large area of acute/subacute ischemia within the left MCA territory.  PMHx HTN, HLD, former smoker, recent NSTEMI, s/p cath 12/04/2021    PT Comments    Pt progressing towards physical therapy goals. Was able to perform transfers and ambulation with gross min assist and SPC for support. Pt with smoothest gait pattern with HHA however needed support for RUE while up. Will continue to follow and progress as able per POC.     Recommendations for follow up therapy are one component of a multi-disciplinary discharge planning process, led by the attending physician.  Recommendations may be updated based on patient status, additional functional criteria and insurance authorization.  Follow Up Recommendations  Skilled nursing-short term rehab (<3 hours/day) Can patient physically be transported by private vehicle: Yes   Assistance Recommended at Discharge Frequent or constant Supervision/Assistance  Patient can return home with the following A little help with walking and/or transfers;A little help with bathing/dressing/bathroom;Assistance with cooking/housework;Assist for transportation;Help with stairs or ramp for entrance   Equipment Recommendations  Other (comment) (defer to next venue)    Recommendations for Other Services Rehab consult     Precautions / Restrictions Precautions Precautions: Fall Restrictions Weight Bearing Restrictions: No     Mobility  Bed Mobility Overal bed mobility: Needs Assistance Bed Mobility: Supine to Sit Rolling: Min assist Sidelying to sit: Min assist       General bed mobility comments: VC's for use of rail. Pt was able to transition to EOB with min assist. Max cues to attend to the RUE which was tucked  under his hip.    Transfers Overall transfer level: Needs assistance Equipment used: 1 person hand held assist, Straight cane Transfers: Sit to/from Stand Sit to Stand: Min assist           General transfer comment: Assist for management of RUE. HHA for power up to full stand. Increased time to gain/maintain standing balance. At end of session pt had cane and required cues to manage cane prior to initiating stand>sit.    Ambulation/Gait Ambulation/Gait assistance: Min assist Gait Distance (Feet): 200 Feet Assistive device: 1 person hand held assist, Straight cane Gait Pattern/deviations: Step-through pattern, Decreased weight shift to right, Decreased stance time - right, Drifts right/left, Trunk flexed, Decreased dorsiflexion - right Gait velocity: Decreased Gait velocity interpretation: 1.31 - 2.62 ft/sec, indicative of limited community ambulator   General Gait Details: VC's for improved posture, forward gaze, and increased heel strike on the R. Pt initially with HHA and gait overall was very smooth, however when given [facility] so therapist could support RUE, pt with more difficulty sequencing. Min assist throughout.   Stairs             Wheelchair Mobility    Modified Rankin (Stroke Patients Only) Modified Rankin (Stroke Patients Only) Pre-Morbid Rankin Score: No symptoms Modified Rankin: Moderately severe disability     Balance Overall balance assessment: Needs assistance Sitting-balance support: Feet supported, No upper extremity supported Sitting balance-Leahy Scale: Fair Sitting balance - Comments: Able to reach outside BoS and adjust socks without LOB   Standing balance support: During functional activity Standing balance-Leahy Scale: Fair Standing balance comment: Able to stand statically with close min guard- needs external support for walking  Cognition Arousal/Alertness: Awake/alert Behavior During Therapy: WFL for  tasks assessed/performed Overall Cognitive Status: Impaired/Different from baseline Area of Impairment: Attention, Memory, Safety/judgement, Following commands, Awareness, Problem solving                   Current Attention Level: Sustained Memory: Decreased short-term memory Following Commands: Follows one step commands with increased time, Follows multi-step commands inconsistently Safety/Judgement: Decreased awareness of safety, Decreased awareness of deficits Awareness: Intellectual Problem Solving: Slow processing, Requires verbal cues General Comments: Decreased awareness of Rt side and overall deficits, requiring cueing for safety and awareness but overall improved.        Exercises      General Comments        Pertinent Vitals/Pain Pain Assessment Pain Assessment: No/denies pain Faces Pain Scale: No hurt    Home Living                          Prior Function            PT Goals (current goals can now be found in the care plan section) Acute Rehab PT Goals Patient Stated Goal: pt didn't state PT Goal Formulation: Patient unable to participate in goal setting Time For Goal Achievement: 12/24/21 Potential to Achieve Goals: Good Progress towards PT goals: Progressing toward goals    Frequency    Min 4X/week      PT Plan Current plan remains appropriate    Co-evaluation              AM-PAC PT "6 Clicks" Mobility   Outcome Measure  Help needed turning from your back to your side while in a flat bed without using bedrails?: A Little Help needed moving from lying on your back to sitting on the side of a flat bed without using bedrails?: A Little Help needed moving to and from a bed to a chair (including a wheelchair)?: A Little Help needed standing up from a chair using your arms (e.g., wheelchair or bedside chair)?: A Little Help needed to walk in hospital room?: A Little Help needed climbing 3-5 steps with a railing? : A Lot 6  Click Score: 17    End of Session Equipment Utilized During Treatment: Gait belt Activity Tolerance: Patient tolerated treatment well Patient left: in chair;with call bell/phone within reach;with chair alarm set Nurse Communication: Mobility status PT Visit Diagnosis: Other abnormalities of gait and mobility (R26.89);Hemiplegia and hemiparesis Hemiplegia - Right/Left: Right Hemiplegia - dominant/non-dominant: Dominant Hemiplegia - caused by: Cerebral infarction     Time: 1594-5859 PT Time Calculation (min) (ACUTE ONLY): 24 min  Charges:  $Gait Training: 23-37 mins                     Conni Slipper, PT, DPT Acute Rehabilitation Services Secure Chat Preferred Office: [phone]    Marylynn Pearson 12/15/2021, 3:00 PM

## 2021-12-16 ENCOUNTER — Encounter (HOSPITAL_COMMUNITY)
Admission: EM | Disposition: A | Discharge status: Skilled Nursing Facility | Payer: Self-pay | Source: Home / Self Care | Attending: Neurology

## 2021-12-16 DIAGNOSIS — Z8673 Personal history of transient ischemic attack (TIA), and cerebral infarction without residual deficits: Secondary | ICD-10-CM | POA: Diagnosis not present

## 2021-12-16 DIAGNOSIS — R1312 Dysphagia, oropharyngeal phase: Secondary | ICD-10-CM | POA: Diagnosis not present

## 2021-12-16 DIAGNOSIS — I63232 Cerebral infarction due to unspecified occlusion or stenosis of left carotid arteries: Secondary | ICD-10-CM | POA: Diagnosis not present

## 2021-12-16 DIAGNOSIS — I639 Cerebral infarction, unspecified: Secondary | ICD-10-CM

## 2021-12-16 DIAGNOSIS — I63512 Cerebral infarction due to unspecified occlusion or stenosis of left middle cerebral artery: Secondary | ICD-10-CM | POA: Diagnosis not present

## 2021-12-16 HISTORY — PX: LOOP RECORDER INSERTION: EP1214

## 2021-12-16 LAB — GLUCOSE, CAPILLARY
Glucose-Capillary: 106 mg/dL — ABNORMAL HIGH (ref 70–99)
Glucose-Capillary: 115 mg/dL — ABNORMAL HIGH (ref 70–99)
Glucose-Capillary: 105 mg/dL — ABNORMAL HIGH (ref 70–99)
Glucose-Capillary: 115 mg/dL — ABNORMAL HIGH (ref 70–99)
Glucose-Capillary: 92 mg/dL (ref 70–99)

## 2021-12-16 SURGERY — LOOP RECORDER INSERTION

## 2021-12-16 MED ORDER — LIDOCAINE-EPINEPHRINE 1 %-1:100000 IJ SOLN
INTRAMUSCULAR | Status: DC | PRN
  Administered 2021-12-16: 30 mL

## 2021-12-16 MED ORDER — LIDOCAINE-EPINEPHRINE 1 %-1:100000 IJ SOLN
INTRAMUSCULAR | Status: AC
  Filled 2021-12-16: qty 1

## 2021-12-16 SURGICAL SUPPLY — 2 items
PACK LOOP INSERTION (CUSTOM PROCEDURE TRAY) ×2 IMPLANT
SYSTEM MONITOR REVEAL LINQ II (Prosthesis & Implant Heart) IMPLANT

## 2021-12-16 NOTE — TOC Transition Note (Signed)
Transition of Care [facility]) - CM/SW Discharge Note   Patient Details  Name: JAIVION KINGSLEY MRN: [id number] Date of Birth: 09-Jan-1956  Transition of Care [facility]) CM/SW Contact:  Geralynn Ochs, LCSW Phone Number: 12/16/2021, 2:31 PM   Clinical Narrative:   CSW confirmed with MD that patient has a bed if able to get loop placed today. CSW sent discharge summary to [facility] and updated about pending loop placement for today, they are aware. CSW met with patient and family at bedside, agreeable to SNF today after loop. Family will provide transportation.  Nurse to call report to [phone], Room 143.    Final next level of care: Skilled Nursing Facility Barriers to Discharge: Barriers Resolved   Patient Goals and CMS Choice Patient states their goals for this hospitalization and ongoing recovery are:: to get back home CMS Medicare.gov Compare Post Acute Care list provided to:: Patient Choice offered to / list presented to : Patient, Spouse  Discharge Placement              Patient chooses bed at: [facility] Patient to be transferred to facility by: Family Name of family member notified: Self, Sister Patient and family notified of of transfer: 12/16/21  Discharge Plan and Services     Post Acute Care Choice: [place]                               Social Determinants of Health (SDOH) Interventions     Readmission Risk Interventions     No data to display

## 2021-12-16 NOTE — Progress Notes (Signed)
Speech Language Pathology Treatment: Dysphagia  Patient Details Name: Terry Mayo MRN: [id number] DOB: 1956/04/20 Today's Date: 12/16/2021 Time: 3818-2993 SLP Time Calculation (min) (ACUTE ONLY): 12 min  Assessment / Plan / Recommendation Clinical Impression  Patient seen by SLP for skilled treatment session focused on dysphagia goals. MD notified SLP that patient is planned to discharge to SNF today and concern was he would not get enough hydration being on honey thick liquids. SLP assessed patient's toleration for nectar thick liquids which he drank via cup sips. He was not impulsive and drank small  controlled sips without needing cues. One instance of trace anterior spillage of juice on right side of mouth. No coughing or throat clearing heard and patient's voice remained clear. SLP recommending upgrade liquids to nectar thick , continue with puree solids and continue with ice chips and/or water sips (not thickened) in between meals and after oral care.     HPI HPI: Patient is a 66 y.o. male with PMH: HTN, HLD, former smoker, recent NSTEMI s/p cath (12/04/21). He was reportedly doing well since cath procedure until he developed acute onset right sided weakness and dysarthria. He presented to the ED for evaluation and was administered TNK. CT head did not show any acute intracranial abnormality but CT angio head neck showed definite opacification of the left MCA and its branches; Infarct core in the left MCA territory. MRI pending. He was intubted on 12/10/21 for thrombectomy at 0130 and extubated same day 0938.      SLP Plan  Continue with current plan of care      Recommendations for follow up therapy are one component of a multi-disciplinary discharge planning process, led by the attending physician.  Recommendations may be updated based on patient status, additional functional criteria and insurance authorization.    Recommendations  Diet recommendations: Dysphagia 1 (puree);Nectar-thick  liquid Liquids provided via: Cup Medication Administration: Crushed with puree Supervision: Intermittent supervision to cue for compensatory strategies Compensations: Slow rate;Small sips/bites;Lingual sweep for clearance of pocketing;Multiple dry swallows after each bite/sip Postural Changes and/or Swallow Maneuvers: Seated upright 90 degrees                Oral Care Recommendations: Oral care BID Follow Up Recommendations: Skilled nursing-short term rehab (<3 hours/day) Assistance recommended at discharge: Frequent or constant Supervision/Assistance SLP Visit Diagnosis: Dysphagia, oropharyngeal phase (R13.12) Plan: Continue with current plan of care         Angela Nevin, MA, CCC-SLP Speech Therapy

## 2021-12-16 NOTE — Consult Note (Addendum)
ELECTROPHYSIOLOGY CONSULT NOTE  Patient ID: Terry Mayo MRN: [id number], DOB/AGE: 11/12/1955   Admit date: 12/09/2021 Date of Consult: 12/16/2021  Primary Physician: [facility] Primary Cardiologist: None  Primary Electrophysiologist: New to None  Reason for Consultation: Cryptogenic stroke; recommendations regarding Implantable Loop Recorder Insurance: [employer]  History of Present Illness EP has been asked to evaluate Terry Mayo for placement of an implantable loop recorder to monitor for atrial fibrillation by Dr Terry Mayo.  The patient was admitted on 12/09/2021 with acute onset R-sided weakness and dysarthria.    Of note, pt previously seen earlier this month for chest pain and NSTEMI. LHC 8/10 with totally occluded LAD, collaterals from RCA, 60% Cx lesion, Dominant RCA with 60-70% stenosis mid vessel.   No PCI performed, aggressive meds recommended.  Pt was discharged 8/12, and returned 8/15 with CVA symptoms above.  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 Etiology unclear, concerning for cardioembolic source  He received IV TNK and then thrombectomy with Dr. Katherina Right Mayo for Left ICA terminus occulsion with TICI 3 result.  He has undergone workup for stroke:  CT negative.   CTA head and neck distal left ICA and left MCA occlusion.   Status post IR with TICI3 reperfusion.   MRI showed left MCA large infarct.   2D Echo EF 45-50% Recommend loop recorder before discharge- EP following for discharge planning LDL 41 HgbA1c 5.5 VTE prophylaxis -Lovenox aspirin 81 mg daily and clopidogrel 75 mg daily prior to admission, Terry Mayo continue DAPT at discharge Therapy recommendations: SNF Disposition: to [facility] today.    The patient has been monitored on telemetry which has demonstrated sinus rhythm with occasional PVCs. Inpatient stroke work-up Terry Mayo not require a TEE  per Neurology.   Echocardiogram as above. Lab work is reviewed.  Prior to admission, the patient denies chest pain, shortness of breath, dizziness, palpitations, or syncope.  He is recovering from his stroke with plans to rehab at SNF  at discharge.  Past Medical History:  Diagnosis Date   Arthritis    Essential hypertension    Hyperlipidemia    Kidney stones      Surgical History:  Past Surgical History:  Procedure Laterality Date   CHOLECYSTECTOMY  1990s   COLONOSCOPY WITH PROPOFOL N/A 09/30/2015   Procedure: COLONOSCOPY WITH PROPOFOL;  Surgeon: Terry Dolin, MD;  [facility];  Service: Endoscopy;  Laterality: N/A;  930   IR CT HEAD LTD  12/10/2021   IR PERCUTANEOUS ART THROMBECTOMY/INFUSION INTRACRANIAL INC DIAG ANGIO  12/10/2021   IR US GUIDE VASC ACCESS RIGHT  12/10/2021   KIDNEY STONE SURGERY  2005   LEFT HEART CATH AND CORONARY ANGIOGRAPHY N/A 12/04/2021   Procedure: LEFT HEART CATH AND CORONARY ANGIOGRAPHY;  Surgeon: Terry Crome, MD;  Location: [place] [facility];  Service: Cardiovascular;  Laterality: N/A;   RADIOLOGY WITH ANESTHESIA N/A 12/10/2021   Procedure: IR WITH ANESTHESIA;  Surgeon: Terry Bras, MD;  Location: [place];  Service: Radiology;  Laterality: N/A;     Medications Prior to Admission  Medication Sig Dispense Refill Last Dose   acetaminophen (TYLENOL) 650 MG CR tablet Take 650 mg by mouth every 8 (eight) hours as needed for pain.      aspirin EC 81 MG tablet Take 1 tablet (81 mg total) by mouth daily. Swallow whole. 30 tablet 3  atorvastatin (LIPITOR) 40 MG tablet Take 40 mg by mouth daily.      clopidogrel (PLAVIX) 75 MG tablet Take 1 tablet (75 mg total) by mouth daily with breakfast. 30 tablet 3    isosorbide mononitrate (IMDUR) 30 MG 24 hr tablet Take 0.5 tablets (15 mg total) by mouth daily. 15 tablet 3    losartan (COZAAR) 25 MG tablet Take 25 mg by mouth daily.      metoprolol succinate (TOPROL-XL) 25 MG 24 hr tablet Take 0.5  tablets (12.5 mg total) by mouth daily. 15 tablet 3    nitroGLYCERIN (NITROSTAT) 0.4 MG SL tablet Place 1 tablet (0.4 mg total) under the tongue every 5 (five) minutes x 3 doses as needed for chest pain. 20 tablet 1    spironolactone (ALDACTONE) 25 MG tablet Take 0.5 tablets (12.5 mg total) by mouth daily. 15 tablet 3     Inpatient Medications:   aspirin  81 mg Oral Daily   atorvastatin  40 mg Oral Daily   clopidogrel  75 mg Oral Daily   docusate  100 mg Oral BID   enoxaparin (LOVENOX) injection  40 mg Subcutaneous Q24H   insulin aspart  1-3 Units Subcutaneous Q4H   pantoprazole  40 mg Oral Daily   polyethylene glycol  17 g Oral Daily    Allergies:  Allergies  Allergen Reactions   Penicillins     REACTION UNKNOWN Has patient had a PCN reaction causing immediate rash, facial/tongue/throat swelling, SOB or lightheadedness with hypotension: UNKNOWN Has patient had a PCN reaction causing severe rash involving mucus membranes or skin necrosis: UNKNOWN Has patient had a PCN reaction that required hospitalization UNKNOWN Has patient had a PCN reaction occurring within the last 10 years: UNKNOWN If all of the above answers are "NO", then may proceed with Cephalosporin use.     Social History   Socioeconomic History   Marital status: Single    Spouse name: Not on file   Number of children: Not on file   Years of education: Not on file   Highest education level: Not on file  Occupational History   Not on file  Tobacco Use   Smoking status: Former    Types: Cigarettes    Quit date: 08/05/2015    Years since quitting: 6.3   Smokeless tobacco: Never  Vaping Use   Vaping Use: Never used  Substance and Sexual Activity   Alcohol use: Not Currently    Alcohol/week: 0.0 standard drinks of alcohol    Comment: No ETOH in a month. Variable, up to daily, as much as much as a fifth of liquor per sitting.   Drug use: No    Frequency: 7.0 times per week    Comment: As often as daily when  he does smoke marijuana   Sexual activity: Yes    Birth control/protection: None  Other Topics Concern   Not on file  Social History Narrative   Not on file   Social Determinants of Health   Financial Resource Strain: Not on file  Food Insecurity: Not on file  Transportation Needs: Not on file  Physical Activity: Not on file  Stress: Not on file  Social Connections: Not on file  Intimate Partner Violence: Not on file     Family History  Problem Relation Age of Onset   Coronary artery disease Sister    Colon cancer Neg Hx       Review of Systems: All other systems reviewed and are otherwise negative  except as noted above.  Physical Exam: Vitals:   12/15/21 2015 12/16/21 0115 12/16/21 0400 12/16/21 0815  BP: 112/72 122/72 130/65 129/69  Pulse: 93 60 60 (!) 57  Resp:  $Remo'18 18 14  'RWktA$ Temp: 98.2 F (36.8 C) (!) 97.4 F (36.3 C) 98.3 F (36.8 C) 98.7 F (37.1 C)  TempSrc: Oral Oral Oral Oral  SpO2: 95% 98% 93% 99%  Weight:      Height:        GEN- The patient is well appearing, alert and oriented x 3 today.   Head- normocephalic, atraumatic Eyes-  Sclera clear, conjunctiva pink Ears- hearing intact Oropharynx- clear Neck- supple Lungs- Clear to ausculation bilaterally, normal work of breathing Heart- Regular rate and rhythm, no murmurs, rubs or gallops  GI- soft, NT, ND, + BS Extremities- no clubbing, cyanosis, or edema MS- no significant deformity or atrophy Skin- no rash or lesion Psych- euthymic mood, full affect Neuro- Right sided facial droop and weakness persist. Some reflexive movement and sensation returning per Neuro notes.   Labs:   Lab Results  Component Value Date   WBC 11.1 (H) 12/15/2021   HGB 14.0 12/15/2021   HCT 39.6 12/15/2021   MCV 92.1 12/15/2021   PLT 191 12/15/2021    Recent Labs  Lab 12/13/21 0204 12/14/21 0613 12/15/21 0400  NA 140   < > 138  K 4.1   < > 4.0  CL 107   < > 107  CO2 22   < > 23  BUN 18   < > 17  CREATININE  1.18   < > 1.13  CALCIUM 8.7*   < > 8.5*  PROT 6.2*  --   --   BILITOT 0.7  --   --   ALKPHOS 68  --   --   ALT 65*  --   --   AST 41  --   --   GLUCOSE 102*   < > 109*   < > = values in this interval not displayed.     Radiology/Studies: DG Swallowing Func-Speech Pathology  Result Date: 12/11/2021 Table formatting from the original result was not included. Objective Swallowing Evaluation: Type of Study: MBS-Modified Barium Swallow Study  Patient Details Name: Terry Mayo MRN: [id number] Date of Birth: [birth year] Today's Date: 12/11/2021 Time: SLP Start Time (ACUTE ONLY): 1345 -SLP Stop Time (ACUTE ONLY): 1400 SLP Time Calculation (min) (ACUTE ONLY): 15 min Past Medical History: Past Medical History: Diagnosis Date  Arthritis   Essential hypertension   Hyperlipidemia   Kidney stones  Past Surgical History: Past Surgical History: Procedure Laterality Date  CHOLECYSTECTOMY  1990s  COLONOSCOPY WITH PROPOFOL N/A 09/30/2015  Procedure: COLONOSCOPY WITH PROPOFOL;  Surgeon: Terry Dolin, MD;  [facility];  Service: Endoscopy;  Laterality: N/A;  930  IR CT HEAD LTD  12/10/2021  IR PERCUTANEOUS ART THROMBECTOMY/INFUSION INTRACRANIAL INC DIAG ANGIO  12/10/2021  IR US GUIDE VASC ACCESS RIGHT  12/10/2021  KIDNEY STONE SURGERY  2005  LEFT HEART CATH AND CORONARY ANGIOGRAPHY N/A 12/04/2021  Procedure: LEFT HEART CATH AND CORONARY ANGIOGRAPHY;  Surgeon: Terry Crome, MD;  Location: [place] [facility];  Service: Cardiovascular;  Laterality: N/A;  RADIOLOGY WITH ANESTHESIA N/A 12/10/2021  Procedure: IR WITH ANESTHESIA;  Surgeon: Terry Bras, MD;  Location: [place];  Service: Radiology;  Laterality: N/A; HPI: Patient is a 66 y.o. male with PMH: HTN, HLD, former smoker, recent NSTEMI s/p cath (12/04/21). He was reportedly doing well  since cath procedure until he developed acute onset right sided weakness and dysarthria. He presented to the ED for evaluation and was administered TNK. CT head did not show any  acute intracranial abnormality but CT angio head neck showed definite opacification of the left MCA and its branches; Infarct core in the left MCA territory. MRI pending. He was intubted on 12/10/21 for thrombectomy at 0130 and extubated same day 0938.  Subjective: awake, alert  Recommendations for follow up therapy are one component of a multi-disciplinary discharge planning process, led by the attending physician.  Recommendations may be updated based on patient status, additional functional criteria and insurance authorization. Assessment / Plan / Recommendation   12/11/2021   2:57 PM Clinical Impressions Clinical Impression Patient presents with a mild-modeate oropharyngeal dysphagia as per this MBS. During oral phase, he exhibited weak lingual manipulation and decreased anterior to posterior transit as well as piecemeal swallowing of all liquids and solids. Mastication of mechanical soft solids was significantly delayed. During pharyngeal phase, patient exhibited swallow initiation delays to level of vallecular sinus with mechanical soft solids and honey thick liquids and delays to level of pyriform sinus with thin and nectar thick liquids. Trace amount of residuals observed in vallecular sinus s/p initial swallows of nectar thick liquids and trace residuals in both vallecular and pyriform sinus observed with honey thick liquids. With thin liquids, patient initially did not have any residuals post swallow but he then had pooling of oral residuals of thin into vallecular and pyriform sinus without sensation, requiring cue for him to swallow. No penetration or aspiration observed with any of the tested barium consistencies. SLP is recommending to start with PO diet of Dys 1 (puree ) solids and honey thick liquids. SLP Visit Diagnosis Dysphagia, oropharyngeal phase (R13.12) Impact on safety and function Mild aspiration risk     12/11/2021   2:57 PM Treatment Recommendations Treatment Recommendations Therapy as  outlined in treatment plan below     12/11/2021   3:03 PM Prognosis Prognosis for Safe Diet Advancement Good Barriers to Reach Goals Time post onset;Severity of deficits   12/11/2021   2:57 PM Diet Recommendations SLP Diet Recommendations Dysphagia 1 (Puree) solids;Honey thick liquids Liquid Administration via Cup Medication Administration Crushed with puree Compensations Slow rate;Small sips/bites Postural Changes Seated upright at 90 degrees     12/11/2021   2:57 PM Other Recommendations Oral Care Recommendations Oral care BID;Oral care before and after PO Follow Up Recommendations Acute inpatient rehab (3hours/day) Assistance recommended at discharge Frequent or constant Supervision/Assistance Functional Status Assessment Patient has had a recent decline in their functional status and demonstrates the ability to make significant improvements in function in a reasonable and predictable amount of time.   12/11/2021   2:57 PM Frequency and Duration  Speech Therapy Frequency (ACUTE ONLY) min 2x/week Treatment Duration 2 weeks     12/11/2021   2:46 PM Oral Phase Oral Phase Impaired Oral - Honey Cup Weak lingual manipulation;Delayed oral transit Oral - Nectar Cup Weak lingual manipulation;Piecemeal swallowing Oral - Thin Cup Piecemeal swallowing;Weak lingual manipulation Oral - Puree Weak lingual manipulation;Reduced posterior propulsion;Delayed oral transit;Piecemeal swallowing Oral - Mech Soft Reduced posterior propulsion;Impaired mastication;Weak lingual manipulation;Piecemeal swallowing    12/11/2021   2:56 PM Pharyngeal Phase Pharyngeal Phase Impaired Pharyngeal- Honey Cup Delayed swallow initiation-vallecula;Pharyngeal residue - valleculae;Pharyngeal residue - pyriform Pharyngeal- Nectar Cup Delayed swallow initiation-pyriform sinuses;Pharyngeal residue - valleculae Pharyngeal- Thin Cup Delayed swallow initiation-pyriform sinuses;Pharyngeal residue - valleculae;Pharyngeal residue - pyriform Pharyngeal- Puree Pharyngeal  residue - pyriform Pharyngeal- Mechanical Soft Delayed swallow initiation-vallecula    12/11/2021   2:57 PM Cervical Esophageal Phase  Cervical Esophageal Phase [facility] Sonia Baller, MA, CCC-SLP Speech Therapy                     DG CHEST PORT 1 VIEW  Result Date: 12/11/2021 CLINICAL DATA:  Leukocytosis EXAM: PORTABLE CHEST 1 VIEW COMPARISON:  12/10/2021 FINDINGS: Interval endotracheal and esophagogastric extubation. Cardiomegaly. Both lungs are clear. The visualized skeletal structures are unremarkable. IMPRESSION: 1.  Interval extubation. 2. Cardiomegaly without acute abnormality of the lungs in [facility] portable projection. Electronically Signed   By: Delanna Ahmadi M.D.   On: 12/11/2021 08:17   MR BRAIN WO CONTRAST  Result Date: 12/11/2021 CLINICAL DATA:  Status post TNK EXAM: MRI HEAD WITHOUT CONTRAST TECHNIQUE: Multiplanar, multiecho pulse sequences of the brain and surrounding structures were obtained without intravenous contrast. COMPARISON:  None Available. FINDINGS: Brain: Large area of acute/subacute ischemia within the left MCA territory. No acute or chronic hemorrhage. Normal white matter signal, parenchymal volume and CSF spaces. The midline structures are normal. Vascular: Major flow voids are preserved. Skull and upper cervical spine: Normal calvarium and skull base. Visualized upper cervical spine and soft tissues are normal. Sinuses/Orbits:No paranasal sinus fluid levels or advanced mucosal thickening. No mastoid or middle ear effusion. Normal orbits. IMPRESSION: Large area of acute/subacute ischemia within the left MCA territory. No hemorrhage or mass effect. Electronically Signed   By: Ulyses Jarred M.D.   On: 12/11/2021 01:10   ECHOCARDIOGRAM LIMITED  Result Date: 12/10/2021    ECHOCARDIOGRAM LIMITED REPORT   Patient Name:   Terry Mayo Date of Exam: 12/10/2021 Medical Rec #:  [id number]    Height:       74.0 in Accession #:    [id number]   Weight:       200.0 lb Date of Birth:  05/24/1955     BSA:          2.173 m Patient Age:    55 years     BP:           131/85 mmHg Patient Gender: M            HR:           85 bpm. Exam Location:  Inpatient Procedure: Limited Echo, Color Doppler and Cardiac Doppler                                 MODIFIED REPORT: This report was modified by Berniece Salines DO on 12/10/2021 due to document accurate                                      LVEF.  Indications:     Stroke i63.9  History:         Patient has prior history of Echocardiogram examinations, most                  recent 12/05/2021. CAD; Risk Factors:Hypertension and                  Dyslipidemia.  Sonographer:     Raquel Sarna Senior RDCS Sonographer#2:   Memory Argue RDCS Referring Phys:  [id number] Dutch Quint BHAGAT Diagnosing Phys: Berniece Salines DO  Sonographer Comments: Limited for stroke IMPRESSIONS  1. Left  ventricular ejection fraction, by estimation, is 45 to 50%. The left ventricle has mildly decreased function. Left ventricular endocardial border not optimally defined to evaluate regional wall motion. Left ventricular diastolic function could not be evaluated.  2. The mitral valve is grossly normal. Trivial mitral valve regurgitation. No evidence of mitral stenosis.  3. The aortic valve was not well visualized. Aortic valve regurgitation is not visualized. No aortic stenosis is present.  4. The inferior vena cava is normal in size with greater than 50% respiratory variability, suggesting right atrial pressure of 3 mmHg. FINDINGS  Left Ventricle: Left ventricular ejection fraction, by estimation, is 45 to 50%. The left ventricle has mildly decreased function. Left ventricular endocardial border not optimally defined to evaluate regional wall motion. Left ventricular diastolic function could not be evaluated. Pericardium: Presence of epicardial fat layer. Mitral Valve: The mitral valve is grossly normal. Trivial mitral valve regurgitation. No evidence of mitral valve stenosis. Tricuspid Valve: The tricuspid valve is not  well visualized. Tricuspid valve regurgitation is not demonstrated. Aortic Valve: The aortic valve was not well visualized. Aortic valve regurgitation is not visualized. No aortic stenosis is present. Aorta: The aortic root was not well visualized. Venous: The inferior vena cava is normal in size with greater than 50% respiratory variability, suggesting right atrial pressure of 3 mmHg. IAS/Shunts: No atrial level shunt detected by color flow Doppler.  LV Volumes (MOD) LV vol d, MOD A2C: 79.6 ml LV vol d, MOD A4C: 116.0 ml LV vol s, MOD A2C: 43.6 ml LV vol s, MOD A4C: 47.1 ml LV SV MOD A2C:     36.0 ml LV SV MOD A4C:     116.0 ml LV SV MOD BP:      50.0 ml Kardie Tobb DO Electronically signed by Berniece Salines DO Signature Date/Time: 12/10/2021/12:36:27 PM    Final (Updated)    IR PERCUTANEOUS ART THROMBECTOMY/INFUSION INTRACRANIAL INC DIAG ANGIO  Result Date: 12/10/2021 INDICATION: 66 year old male who presented to [facility] with right-sided weakness and dysarthria. His past medical history significant for hypertension, hyperlipidemia, former smoking, BMI 25.67, recent NSTEMI. Head CT showed hypodensity of the left insular cortical ribbon and basal ganglia (ASPECTS 8), no hemorrhage. Intravenous TNK was then administered. CT angiogram of the head and neck showed an occlusion of the left ICA terminus. He was then transferred to our service for a diagnostic cerebral angiogram and mechanical thrombectomy. EXAM: ULTRASOUND-GUIDED VASCULAR ACCESS DIAGNOSTIC CEREBRAL ANGIOGRAM MECHANICAL THROMBECTOMY FLAT PANEL HEAD CT COMPARISON:  CT/CT angiogram of the head and neck December 09, 2021. MEDICATIONS: No antibiotics administered. ANESTHESIA/SEDATION: The procedure was performed under general anesthesia. CONTRAST:  70 mL of Omnipaque 300 milligram/mL FLUOROSCOPY: Radiation Exposure Index (as provided by the fluoroscopic device): 032.1 mGy Kerma COMPLICATIONS: None immediate. TECHNIQUE: Informed written consent was  obtained from the patient's daughter after a thorough discussion of the procedural risks, benefits and alternatives. All questions were addressed. Maximal Sterile Barrier Technique was utilized including caps, mask, sterile gowns, sterile gloves, sterile drape, hand hygiene and skin antiseptic. A timeout was performed prior to the initiation of the procedure. The right groin was prepped and draped in the usual sterile fashion. Using a micropuncture kit and the modified Seldinger technique, access was gained to the right common femoral artery and an 8 French sheath was placed. Real-time ultrasound guidance was utilized for vascular access including the acquisition of a permanent ultrasound image documenting patency of the accessed vessel. Under fluoroscopy, a Zoom 88 guide catheter was navigated over  a 6 [place] Berenstein 2 catheter and a 0.035" Terumo Glidewire into the aortic arch. Attempt to catheterize the left common carotid artery proved unsuccessful. The Berenstein 2 catheter was then exchanged for a VTK catheter. The catheter was placed into the left common carotid artery and then advanced into the left internal carotid artery. The diagnostic catheter was removed. Frontal and lateral angiograms of the head were obtained. FINDINGS: 1. Normal caliber of the right common femoral artery, adequate for vascular access. 2. 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 3. Severe tortuosity of the cervical left ICA and proximal left common carotid artery. PROCEDURE: Using biplane roadmap, a zoom 71 aspiration catheter was navigated over Colossus 35 microguidewire into the cavernous segment of the left ICA. The aspiration catheter was then advanced to the level of occlusion and connected to an aspiration pump. Continuous aspiration was performed for 2 minutes. The guide catheter was connected to a VacLok syringe. The aspiration catheter was subsequently removed  under constant aspiration. The guide catheter was aspirated for debris. Left internal carotid artery angiogram showed recanalization of the left ICA terminus with patent but stenotic proximal left M1/MCA related to residual thrombus with slow distal flow (TICI 2C). Using biplane roadmap, a zoom 71 aspiration catheter was navigated over Colossus 35 microguidewire into the cavernous segment of the left ICA. The aspiration catheter was then advanced to the level of occlusion in the M1 segment and connected to an aspiration pump. Continuous aspiration was performed for 2 minutes. The guide catheter was connected to a VacLok syringe. The aspiration catheter was subsequently removed under constant aspiration. The guide catheter was aspirated for debris. Left internal carotid artery angiogram with frontal and lateral views of the head showed complete recanalization of the left ICA, ACA and MCA vascular territories. Flat panel CT of the head was obtained and post processed in a separate workstation with concurrent attending physician supervision. Selected images were sent to PACS. No evidence of hemorrhagic complication. Delayed left internal carotid artery angiograms with frontal and lateral views of the head showed persistent patency of the left anterior circulation. The catheter was subsequently withdrawn. Right common femoral artery angiogram was obtained in right anterior oblique view. The puncture is at the level of the common femoral artery. The artery has normal caliber, adequate for closure device. The sheath was exchanged over the wire for a Perclose prostyle which was utilized for access closure. Immediate hemostasis was achieved. IMPRESSION: 1. Successful mechanical thrombectomy for treatment of a left ICA terminus occlusion with direct contact aspiration achieving complete recanalization (TICI 3). 2. No thromboembolic or hemorrhagic complication. PLAN: Transfer to ICU for continued post stroke care.  Electronically Signed   By: Pedro Earls M.D.   On: 12/10/2021 10:24   IR US Guide Vasc Access Right  Result Date: 12/10/2021 INDICATION: 66 year old male who presented to [facility] with right-sided weakness and dysarthria. His past medical history significant for hypertension, hyperlipidemia, former smoking, BMI 25.67, recent NSTEMI. Head CT showed hypodensity of the left insular cortical ribbon and basal ganglia (ASPECTS 8), no hemorrhage. Intravenous TNK was then administered. CT angiogram of the head and neck showed an occlusion of the left ICA terminus. He was then transferred to our service for a diagnostic cerebral angiogram and mechanical thrombectomy. EXAM: ULTRASOUND-GUIDED VASCULAR ACCESS DIAGNOSTIC CEREBRAL ANGIOGRAM MECHANICAL THROMBECTOMY FLAT PANEL HEAD CT COMPARISON:  CT/CT angiogram of the head and neck December 09, 2021. MEDICATIONS:  No antibiotics administered. ANESTHESIA/SEDATION: The procedure was performed under general anesthesia. CONTRAST:  70 mL of Omnipaque 300 milligram/mL FLUOROSCOPY: Radiation Exposure Index (as provided by the fluoroscopic device): 893.8 mGy Kerma COMPLICATIONS: None immediate. TECHNIQUE: Informed written consent was obtained from the patient's daughter after a thorough discussion of the procedural risks, benefits and alternatives. All questions were addressed. Maximal Sterile Barrier Technique was utilized including caps, mask, sterile gowns, sterile gloves, sterile drape, hand hygiene and skin antiseptic. A timeout was performed prior to the initiation of the procedure. The right groin was prepped and draped in the usual sterile fashion. Using a micropuncture kit and the modified Seldinger technique, access was gained to the right common femoral artery and an 8 French sheath was placed. Real-time ultrasound guidance was utilized for vascular access including the acquisition of a permanent ultrasound image documenting patency of the  accessed vessel. Under fluoroscopy, a Zoom 88 guide catheter was navigated over a 6 [place] Berenstein 2 catheter and a 0.035" Terumo Glidewire into the aortic arch. Attempt to catheterize the left common carotid artery proved unsuccessful. The Berenstein 2 catheter was then exchanged for a VTK catheter. The catheter was placed into the left common carotid artery and then advanced into the left internal carotid artery. The diagnostic catheter was removed. Frontal and lateral angiograms of the head were obtained. FINDINGS: 1. Normal caliber of the right common femoral artery, adequate for vascular access. 2. 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 3. Severe tortuosity of the cervical left ICA and proximal left common carotid artery. PROCEDURE: Using biplane roadmap, a zoom 71 aspiration catheter was navigated over Colossus 35 microguidewire into the cavernous segment of the left ICA. The aspiration catheter was then advanced to the level of occlusion and connected to an aspiration pump. Continuous aspiration was performed for 2 minutes. The guide catheter was connected to a VacLok syringe. The aspiration catheter was subsequently removed under constant aspiration. The guide catheter was aspirated for debris. Left internal carotid artery angiogram showed recanalization of the left ICA terminus with patent but stenotic proximal left M1/MCA related to residual thrombus with slow distal flow (TICI 2C). Using biplane roadmap, a zoom 71 aspiration catheter was navigated over Colossus 35 microguidewire into the cavernous segment of the left ICA. The aspiration catheter was then advanced to the level of occlusion in the M1 segment and connected to an aspiration pump. Continuous aspiration was performed for 2 minutes. The guide catheter was connected to a VacLok syringe. The aspiration catheter was subsequently removed under constant aspiration. The guide  catheter was aspirated for debris. Left internal carotid artery angiogram with frontal and lateral views of the head showed complete recanalization of the left ICA, ACA and MCA vascular territories. Flat panel CT of the head was obtained and post processed in a separate workstation with concurrent attending physician supervision. Selected images were sent to PACS. No evidence of hemorrhagic complication. Delayed left internal carotid artery angiograms with frontal and lateral views of the head showed persistent patency of the left anterior circulation. The catheter was subsequently withdrawn. Right common femoral artery angiogram was obtained in right anterior oblique view. The puncture is at the level of the common femoral artery. The artery has normal caliber, adequate for closure device. The sheath was exchanged over the wire for a Perclose prostyle which was utilized for access closure. Immediate hemostasis was achieved. IMPRESSION: 1. Successful mechanical thrombectomy for treatment of a left ICA  terminus occlusion with direct contact aspiration achieving complete recanalization (TICI 3). 2. No thromboembolic or hemorrhagic complication. PLAN: Transfer to ICU for continued post stroke care. Electronically Signed   By: Pedro Earls M.D.   On: 12/10/2021 10:24   IR CT Head Ltd  Result Date: 12/10/2021 INDICATION: 66 year old male who presented to [facility] with right-sided weakness and dysarthria. His past medical history significant for hypertension, hyperlipidemia, former smoking, BMI 25.67, recent NSTEMI. Head CT showed hypodensity of the left insular cortical ribbon and basal ganglia (ASPECTS 8), no hemorrhage. Intravenous TNK was then administered. CT angiogram of the head and neck showed an occlusion of the left ICA terminus. He was then transferred to our service for a diagnostic cerebral angiogram and mechanical thrombectomy. EXAM: ULTRASOUND-GUIDED VASCULAR ACCESS  DIAGNOSTIC CEREBRAL ANGIOGRAM MECHANICAL THROMBECTOMY FLAT PANEL HEAD CT COMPARISON:  CT/CT angiogram of the head and neck December 09, 2021. MEDICATIONS: No antibiotics administered. ANESTHESIA/SEDATION: The procedure was performed under general anesthesia. CONTRAST:  70 mL of Omnipaque 300 milligram/mL FLUOROSCOPY: Radiation Exposure Index (as provided by the fluoroscopic device): 829.5 mGy Kerma COMPLICATIONS: None immediate. TECHNIQUE: Informed written consent was obtained from the patient's daughter after a thorough discussion of the procedural risks, benefits and alternatives. All questions were addressed. Maximal Sterile Barrier Technique was utilized including caps, mask, sterile gowns, sterile gloves, sterile drape, hand hygiene and skin antiseptic. A timeout was performed prior to the initiation of the procedure. The right groin was prepped and draped in the usual sterile fashion. Using a micropuncture kit and the modified Seldinger technique, access was gained to the right common femoral artery and an 8 French sheath was placed. Real-time ultrasound guidance was utilized for vascular access including the acquisition of a permanent ultrasound image documenting patency of the accessed vessel. Under fluoroscopy, a Zoom 88 guide catheter was navigated over a 6 [place] Berenstein 2 catheter and a 0.035" Terumo Glidewire into the aortic arch. Attempt to catheterize the left common carotid artery proved unsuccessful. The Berenstein 2 catheter was then exchanged for a VTK catheter. The catheter was placed into the left common carotid artery and then advanced into the left internal carotid artery. The diagnostic catheter was removed. Frontal and lateral angiograms of the head were obtained. FINDINGS: 1. Normal caliber of the right common femoral artery, adequate for vascular access. 2. 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 3. Severe tortuosity of the  cervical left ICA and proximal left common carotid artery. PROCEDURE: Using biplane roadmap, a zoom 71 aspiration catheter was navigated over Colossus 35 microguidewire into the cavernous segment of the left ICA. The aspiration catheter was then advanced to the level of occlusion and connected to an aspiration pump. Continuous aspiration was performed for 2 minutes. The guide catheter was connected to a VacLok syringe. The aspiration catheter was subsequently removed under constant aspiration. The guide catheter was aspirated for debris. Left internal carotid artery angiogram showed recanalization of the left ICA terminus with patent but stenotic proximal left M1/MCA related to residual thrombus with slow distal flow (TICI 2C). Using biplane roadmap, a zoom 71 aspiration catheter was navigated over Colossus 35 microguidewire into the cavernous segment of the left ICA. The aspiration catheter was then advanced to the level of occlusion in the M1 segment and connected to an aspiration pump. Continuous aspiration was performed for 2 minutes. The guide catheter was connected to a VacLok syringe. The aspiration catheter was subsequently  removed under constant aspiration. The guide catheter was aspirated for debris. Left internal carotid artery angiogram with frontal and lateral views of the head showed complete recanalization of the left ICA, ACA and MCA vascular territories. Flat panel CT of the head was obtained and post processed in a separate workstation with concurrent attending physician supervision. Selected images were sent to PACS. No evidence of hemorrhagic complication. Delayed left internal carotid artery angiograms with frontal and lateral views of the head showed persistent patency of the left anterior circulation. The catheter was subsequently withdrawn. Right common femoral artery angiogram was obtained in right anterior oblique view. The puncture is at the level of the common femoral artery. The artery  has normal caliber, adequate for closure device. The sheath was exchanged over the wire for a Perclose prostyle which was utilized for access closure. Immediate hemostasis was achieved. IMPRESSION: 1. Successful mechanical thrombectomy for treatment of a left ICA terminus occlusion with direct contact aspiration achieving complete recanalization (TICI 3). 2. No thromboembolic or hemorrhagic complication. PLAN: Transfer to ICU for continued post stroke care. Electronically Signed   By: Pedro Earls M.D.   On: 12/10/2021 10:24   DG CHEST PORT 1 VIEW  Result Date: 12/10/2021 CLINICAL DATA:  Intubation and OG tube placement. EXAM: PORTABLE CHEST 1 VIEW COMPARISON:  12/04/2021. FINDINGS: The heart size and mediastinal contours are within normal limits. Linear densities are present over the lateral right lung which may be due to external structures. No consolidation, effusion, or pneumothorax. The endotracheal tube terminates 7.4 cm above the carina. An enteric tube is seen in the stomach. No acute osseous abnormality. IMPRESSION: 1. No acute cardiopulmonary process. 2. Endotracheal and enteric tubes as described above. Electronically Signed   By: Brett Fairy M.D.   On: 12/10/2021 04:15   CT ANGIO HEAD NECK W WO CM W PERF (CODE STROKE)  Result Date: 12/09/2021 CLINICAL DATA:  Right-sided weakness, slurred speech, facial droop EXAM: CT ANGIOGRAPHY HEAD AND NECK CT PERFUSION BRAIN TECHNIQUE: Multidetector CT imaging of the head and neck was performed using the standard protocol during bolus administration of intravenous contrast. Multiplanar CT image reconstructions and MIPs were obtained to evaluate the vascular anatomy. Carotid stenosis measurements (when applicable) are obtained utilizing NASCET criteria, using the distal internal carotid diameter as the denominator. Multiphase CT imaging of the brain was performed following IV bolus contrast injection. Subsequent parametric perfusion maps were  calculated using RAPID software. RADIATION DOSE REDUCTION: This exam was performed according to the departmental dose-optimization program which includes automated exposure control, adjustment of the mA and/or kV according to patient size and/or use of iterative reconstruction technique. CONTRAST:  138mL OMNIPAQUE IOHEXOL 350 MG/ML SOLN COMPARISON:  12/09/2021 CT head, no prior CTA FINDINGS: CT HEAD FINDINGS For noncontrast findings, please see same day CT head. CTA NECK FINDINGS Aortic arch: 4 vessel arch, with the origin of the left vertebral artery from the aorta. Imaged portion shows no evidence of aneurysm or dissection. No significant stenosis of the major arch vessel origins. Right carotid system: No evidence of dissection, occlusion, or hemodynamically significant stenosis (greater than 50%). Left carotid system: No evidence of dissection, occlusion, or hemodynamically significant stenosis (greater than 50%). Vertebral arteries: No evidence of dissection, occlusion, or hemodynamically significant stenosis (greater than 50%). Skeleton: No acute osseous abnormality. Other neck: Negative Upper chest: Centrilobular and paraseptal emphysema. No focal pulmonary opacity or pleural effusion. Review of the MIP images confirms the above findings CTA HEAD FINDINGS Anterior circulation: Focal  cutoff of the left ICA near the terminus (series 6, image 97). The right ICA is patent to the terminus without significant stenosis. A1 segments patent, with the left A1 likely filling from retrograde flow or collaterals. The origin of the left A1 is not opacified. Normal anterior communicating artery. Anterior cerebral arteries are patent to their distal aspects. The left MCA is not definitively opacified nor are its branches opacified. The right M1 and distal MCA branches are perfused without significant stenosis. MCA branches perfused and symmetric. Posterior circulation: Vertebral arteries patent to the vertebrobasilar junction  without stenosis. Posterior inferior cerebellar arteries patent proximally. Basilar patent to its distal aspect. Superior cerebellar arteries patent proximally. Patent P1 segments. PCAs perfused to their distal aspects without stenosis. The left posterior communicating artery is visualized. Venous sinuses: As permitted by contrast timing, patent. Anatomic variants: None significant. Review of the MIP images confirms the above findings CT Brain Perfusion Findings: ASPECTS: 10, although in retrospect there is some loss of definition of the insula. CBF (<30%) Volume: 49mL Perfusion (Tmax>6.0s) volume: 390mL Mismatch Volume: 248mL Infarction Location:Left frontal and lobe in the left MCA territory. IMPRESSION: 1. Occlusion of the distal left ICA (series 6, image 97), with no definite opacification of the left MCA and its branches. 2. Infarct core in the left MCA territory measures 69 mL, with penumbra measuring 359 mL with a mismatch volume of 290 mL. 3. No other hemodynamically significant stenosis in the head or neck. Code stroke imaging results were communicated on 12/09/2021 at 11:44 pm to provider ZAMMIT via telephone, who verbally acknowledged these results. Electronically Signed   By: Merilyn Baba M.D.   On: 12/09/2021 23:54   CT HEAD CODE STROKE WO CONTRAST  Result Date: 12/09/2021 CLINICAL DATA:  Code stroke.  Sudden onset weakness EXAM: CT HEAD WITHOUT CONTRAST TECHNIQUE: Contiguous axial images were obtained from the base of the skull through the vertex without intravenous contrast. RADIATION DOSE REDUCTION: This exam was performed according to the departmental dose-optimization program which includes automated exposure control, adjustment of the mA and/or kV according to patient size and/or use of iterative reconstruction technique. COMPARISON:  None Available. FINDINGS: Brain: No evidence of acute infarction, hemorrhage, cerebral edema, mass, mass effect, or midline shift. No hydrocephalus or  extra-axial collection. Vascular: No definite hyperdense vessel. Skull: Negative for fracture or focal lesion. Sinuses/Orbits: No acute finding. Other: The mastoid air cells are well aerated. ASPECTS [facility] Stroke Program Early CT Score) - Ganglionic level infarction (caudate, lentiform nuclei, internal capsule, insula, M1-M3 cortex): 7 - Supraganglionic infarction (M4-M6 cortex): 3 Total score (0-10 with 10 being normal): 10 IMPRESSION: 1. No acute intracranial process. 2. ASPECTS is 10 Code stroke imaging results were communicated on 12/09/2021 at 10:44 pm to provider ZAMMIT via telephone, who verbally acknowledged these results. Electronically Signed   By: Merilyn Baba M.D.   On: 12/09/2021 22:46   ECHOCARDIOGRAM COMPLETE  Result Date: 12/05/2021    ECHOCARDIOGRAM REPORT   Patient Name:   Terry Mayo Date of Exam: 12/05/2021 Medical Rec #:  [id number]    Height:       74.0 in Accession #:    [id number]   Weight:       200.0 lb Date of Birth:  Dec 28, 1955    BSA:          2.173 m Patient Age:    66 years     BP:           128/83 mmHg Patient Gender:  M            HR:           67 bpm. Exam Location:  Inpatient Procedure: 2D Echo Indications:    NSTEMI  History:        Patient has no prior history of Echocardiogram examinations.                 CAD.  Sonographer:    Johny Chess RDCS Referring Phys: [place]  1. False tendon in the apical LV of no clinical significance. Left ventricular ejection fraction, by estimation, is 45 to 50%. The left ventricle has mildly decreased function. The left ventricle demonstrates regional wall motion abnormalities (see scoring diagram/findings for description). Left ventricular diastolic parameters are indeterminate. There is akinesis of the left ventricular, apical septal wall and anterior wall. There is hypokinesis of the left ventricular, mid anterior wall. There is  akinesis of the left ventricular, apical segment. There is akinesis of the left  ventricular, mid anteroseptal wall.  2. Right ventricular systolic function is normal. The right ventricular size is normal. A Prominent moderator band is visualized. There is normal pulmonary artery systolic pressure. The estimated right ventricular systolic pressure is 78.6 mmHg.  3. The mitral valve is normal in structure. Trivial mitral valve regurgitation. No evidence of mitral stenosis.  4. The aortic valve is normal in structure. Aortic valve regurgitation is not visualized. No aortic stenosis is present.  5. The inferior vena cava is dilated in size with >50% respiratory variability, suggesting right atrial pressure of 8 mmHg. FINDINGS  Left Ventricle: False tendon in the apical LV of no clinical significance. Left ventricular ejection fraction, by estimation, is 45 to 50%. The left ventricle has mildly decreased function. The left ventricle demonstrates regional wall motion abnormalities. The left ventricular internal cavity size was normal in size. There is no left ventricular hypertrophy. Left ventricular diastolic parameters are indeterminate. Normal left ventricular filling pressure. Right Ventricle: The right ventricular size is normal. No increase in right ventricular wall thickness. Right ventricular systolic function is normal. There is normal pulmonary artery systolic pressure. The tricuspid regurgitant velocity is 2.52 m/s, and  with an assumed right atrial pressure of 8 mmHg, the estimated right ventricular systolic pressure is 76.7 mmHg. Left Atrium: Left atrial size was normal in size. Right Atrium: Right atrial size was normal in size. Pericardium: There is no evidence of pericardial effusion. Mitral Valve: The mitral valve is normal in structure. Trivial mitral valve regurgitation. No evidence of mitral valve stenosis. Tricuspid Valve: The tricuspid valve is normal in structure. Tricuspid valve regurgitation is trivial. No evidence of tricuspid stenosis. Aortic Valve: The aortic valve is  normal in structure. Aortic valve regurgitation is not visualized. No aortic stenosis is present. Pulmonic Valve: The pulmonic valve was normal in structure. Pulmonic valve regurgitation is not visualized. No evidence of pulmonic stenosis. Aorta: The aortic root is normal in size and structure. Venous: The inferior vena cava is dilated in size with greater than 50% respiratory variability, suggesting right atrial pressure of 8 mmHg. IAS/Shunts: No atrial level shunt detected by color flow Doppler. Additional Comments: A Prominent moderator band is visualized.  LEFT VENTRICLE PLAX 2D LVIDd:         5.30 cm     Diastology LVIDs:         4.00 cm     LV e' medial:    6.53 cm/s LV PW:  1.00 cm     LV E/e' medial:  11.5 LV IVS:        1.00 cm     LV e' lateral:   8.27 cm/s LVOT diam:     2.20 cm     LV E/e' lateral: 9.1 LV SV:         57 LV SV Index:   26 LVOT Area:     3.80 cm  LV Volumes (MOD) LV vol d, MOD A4C: 96.4 ml LV vol s, MOD A4C: 61.0 ml LV SV MOD A4C:     96.4 ml RIGHT VENTRICLE             IVC RV S prime:     12.00 cm/s  IVC diam: 2.50 cm TAPSE (M-mode): 1.9 cm LEFT ATRIUM             Index        RIGHT ATRIUM           Index LA diam:        3.40 cm 1.56 cm/m   RA Area:     17.10 cm LA Vol (A2C):   60.8 ml 27.97 ml/m  RA Volume:   44.20 ml  20.34 ml/m LA Vol (A4C):   50.9 ml 23.42 ml/m LA Biplane Vol: 61.5 ml 28.30 ml/m  AORTIC VALVE LVOT Vmax:   77.80 cm/s LVOT Vmean:  48.700 cm/s LVOT VTI:    0.150 m  AORTA Ao Root diam: 3.40 cm Ao Asc diam:  3.50 cm MITRAL VALVE               TRICUSPID VALVE MV Area (PHT): 2.87 cm    TR Peak grad:   25.4 mmHg MV Decel Time: 264 msec    TR Vmax:        252.00 cm/s MV E velocity: 75.10 cm/s MV A velocity: 53.40 cm/s  SHUNTS MV E/A ratio:  1.41        Systemic VTI:  0.15 m                            Systemic Diam: 2.20 cm Armanda Magic MD Electronically signed by Armanda Magic MD Signature Date/Time: 12/05/2021/10:57:48 AM    Final    CARDIAC  CATHETERIZATION  Result Date: 12/04/2021 CONCLUSIONS: On presentation to the Cath Lab, no chest discomfort since 6 AM this morning.  The left coronary anatomy is ambiguous because of multiple diagonals as ramus light branches.  But we can never find the stump.  There are faint hint of thrombus distal or around the origin of the large septal perforator.  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 Left main is widely patent Circumflex gives origin to 2 large obtuse marginal branches that arise from a common stent.  The more medial branch is larger and has moderate proximal disease up to 60%. Right coronary contains codominant anatomy and moderate mid vessel disease up to 60 to 70% in the mid vessel.  It supplies collaterals around the apex to the LAD. Left ventriculography demonstrates inferoapical and apical moderate hypokinesis.  EF 40 to 50%.  LVEDP upper normal at 17 mmHg. Recommend: Aspirin and Plavix for at least 6 months. Aggressive lipid-lowering.  Nitroglycerin if recurrent chest pain.   DG Chest Portable 1 View  Result Date: 12/04/2021 CLINICAL DATA:  Chest pain.  Shortness of breath. EXAM: PORTABLE CHEST 1 VIEW COMPARISON:  07/15/2015 FINDINGS: Lower lung volumes from prior exam. There is patchy ill-defined opacity in the right infrahilar lung. Heart size upper normal with normal mediastinal contours. No pulmonary edema. No pleural effusion or pneumothorax. No acute osseous abnormalities are seen. IMPRESSION: Patchy ill-defined opacity in the right infrahilar lung, may be atelectasis or pneumonia. Electronically Signed   By: Keith Rake M.D.   On: 12/04/2021 03:59    12-lead ECG 8/15 shows NSR at 72 bpm (personally reviewed) All prior EKG's in EPIC reviewed with no documented atrial fibrillation  Telemetry NSR with occasional PVCs (personally reviewed)  Assessment and Plan:  1. Cryptogenic stroke The patient presents with cryptogenic stroke.  The  patient does not have a TEE planned for this AM.  I spoke at length with the patient about monitoring for afib with an implantable loop recorder.  Risks, benefits, and alteratives to implantable loop recorder were discussed with the patient today.   At this time, the patient is very clear in their decision to proceed with implantable loop recorder.   2. CAD With recent NSTEMI LHC 8/10 with totally occluded LAD, collaterals from RCA, 60% Cx lesion, Dominant RCA with 60-70% stenosis mid vessel.   No PCI performed, aggressive meds recommended. We discuss with interventional team and though timing is suspicious, CVA did not occur close enough to cath that would change recommendation for loop recorder.   Wound care was reviewed with the patient (keep incision clean and dry for 3 days).    Shirley Friar, PA-C 12/16/2021 10:50 AM  I have seen and examined this patient with Oda Kilts.  Agree with above, note added to reflect my findings. Patient feeling well wihtout complaint. Continued right sided weakness.  GEN: Well nourished, well developed, in no acute distress  HEENT: normal  Neck: no JVD, carotid bruits, or masses Cardiac: RRR; no murmurs, rubs, or gallops,no edema  Respiratory:  clear to auscultation bilaterally, normal work of breathing GI: soft, nontender, nondistended, + BS MS: no deformity or atrophy  Skin: warm and dry Neuro:  right sided weakness Psych: euthymic mood, full affect   Cryptogenic stroke:  Patient presented to the hospital with cryptogenic stroke. To date, no cause has been found. Roemello Speyer plan for LINQ monitor to look for atrial fibrillation. Risks and benefits discussed. Risks include but not limited to bleeding and infection. The patient understands the risks and has agreed to the procedure.  Jillyn Stacey M. Arnold Kester MD 12/16/2021 1:08 PM

## 2021-12-16 NOTE — Discharge Instructions (Signed)
Care After Your Loop Recorder  You have a [employer] Loop Recorder   Monitor your cardiac device site for redness, swelling, and drainage. Call the device clinic at [phone] if you experience these symptoms or fever/chills.  If you notice bleeding from your site, hold firm, but gently pressure with two fingers for 5 minutes. Dried blood on the steri-strips when removing the outer bandage is normal.   Keep the large square bandage on your site for 24 hours and then you may remove it yourself. Keep the steri-strips underneath in place.   You may shower after 72 hours / 3 days from your procedure with the steri-strips in place. They will usually fall off on their own, or may be removed after 10 days. Pat dry.   Avoid lotions, ointments, or perfumes over your incision until it is well-healed.  Please do not submerge in water until your site is completely healed.   Your device is MRI compatible.   Remote monitoring is used to monitor your cardiac device from home. This monitoring is scheduled every month by our office. It allows us to keep an eye on the function of your device to ensure it is working properly.    

## 2021-12-16 NOTE — Progress Notes (Signed)
Physical Therapy Treatment Patient Details Name: Terry [name] Mayo DOB: 04/25/1956 Today's Date: 12/16/2021   History of Present Illness Pt is a 66 y/o male admitted 8/15 with acute onset of right-sided weakness and dysarthria.  MRI 8/17-Large area of acute/subacute ischemia within the left MCA territory.  PMHx HTN, HLD, former smoker, recent NSTEMI, s/p cath 12/04/2021    PT Comments    Received pt sitting upright in bed and agreeable to PT treatment. Pt performed bed mobility with supervision with cues for RUE attention as pt leaving RUE behind in bed. Pt stood with SPC and min guard/min A and progressed with ambulation in hallway today using SPC and min guard with a few instances requiring min A when distracted resulting in unsteadiness. Pt continues to demonstrate R inattention (UE>LE) and slightly decreased insight into deficits. Pt reports he will have his grandparents and nephew to provide supervision, but no physical assist at home. Acute PT to cont to follow.    Recommendations for follow up therapy are one component of a multi-disciplinary discharge planning process, led by the attending physician.  Recommendations may be updated based on patient status, additional functional criteria and insurance authorization.  Follow Up Recommendations  Skilled nursing-short term rehab (<3 hours/day) Can patient physically be transported by private vehicle: Yes   Assistance Recommended at Discharge Frequent or constant Supervision/Assistance  Patient can return home with the following A little help with walking and/or transfers;A little help with bathing/dressing/bathroom;Assistance with cooking/housework;Assist for transportation;Help with stairs or ramp for entrance;Direct supervision/assist for medications management;Direct supervision/assist for financial management   Equipment Recommendations  Cane    Recommendations for Other Services Rehab consult     Precautions /  Restrictions Precautions Precautions: Fall Precaution Comments: mild R hemi UE>LE Restrictions Weight Bearing Restrictions: No     Mobility  Bed Mobility Overal bed mobility: Needs Assistance Bed Mobility: Supine to Sit Rolling: Supervision   Supine to sit: Supervision, HOB elevated     General bed mobility comments: HOB elevated and cues for attention to RUE Patient Response: Cooperative  Transfers Overall transfer level: Needs assistance Equipment used: Straight cane Transfers: Sit to/from Stand Sit to Stand: Min assist           General transfer comment: stood from EOB with SPC and min A with assist for R hemibody management    Ambulation/Gait Ambulation/Gait assistance: Min Chemical engineer (Feet): 250 Feet Assistive device: Straight cane Gait Pattern/deviations: Step-through pattern, Drifts right/left, Decreased dorsiflexion - right, Decreased step length - right, Decreased step length - left, Decreased stride length, Narrow base of support Gait velocity: Decreased Gait velocity interpretation: 1.31 - 2.62 ft/sec, indicative of limited community ambulator   General Gait Details: narrow BOS and generalized unsteadiness when distracted talking to staff in hallway but no true LOB   Stairs             Wheelchair Mobility    Modified Rankin (Stroke Patients Only)       Balance Overall balance assessment: Needs assistance Sitting-balance support: Feet supported, No upper extremity supported Sitting balance-Leahy Scale: Fair     Standing balance support: During functional activity, Single extremity supported, Reliant on assistive device for balance (SPC) Standing balance-Leahy Scale: Fair Standing balance comment: able to maintain static standing balance with very close supervision but required min guard/min A for dynamic standing balance  Cognition Arousal/Alertness: Awake/alert Behavior During Therapy: WFL  for tasks assessed/performed Overall Cognitive Status: Impaired/Different from baseline Area of Impairment: Awareness, Safety/judgement, Problem solving                       Following Commands: Follows one step commands with increased time, Follows multi-step commands inconsistently Safety/Judgement: Decreased awareness of safety, Decreased awareness of deficits     General Comments: R inattention (particuarly in UE) and decreased insight into deficits        Exercises      General Comments General comments (skin integrity, edema, etc.): pt reported being ready to go home; states he has support from his grandparents and nephew at home      Pertinent Vitals/Pain Pain Assessment Pain Assessment: No/denies pain    Home Living                          Prior Function            PT Goals (current goals can now be found in the care plan section) Acute Rehab PT Goals Patient Stated Goal: to go home PT Goal Formulation: With patient Time For Goal Achievement: 12/24/21 Potential to Achieve Goals: Good Progress towards PT goals: Progressing toward goals    Frequency    Min 4X/week      PT Plan Current plan remains appropriate    Co-evaluation              AM-PAC PT "6 Clicks" Mobility   Outcome Measure  Help needed turning from your back to your side while in a flat bed without using bedrails?: A Little Help needed moving from lying on your back to sitting on the side of a flat bed without using bedrails?: A Little Help needed moving to and from a bed to a chair (including a wheelchair)?: A Little Help needed standing up from a chair using your arms (e.g., wheelchair or bedside chair)?: A Little Help needed to walk in hospital room?: A Little Help needed climbing 3-5 steps with a railing? : A Lot 6 Click Score: 17    End of Session Equipment Utilized During Treatment: Gait belt Activity Tolerance: Patient tolerated treatment well Patient  left: in chair;with call bell/phone within reach;with chair alarm set Nurse Communication: Mobility status PT Visit Diagnosis: Other abnormalities of gait and mobility (R26.89);Hemiplegia and hemiparesis;Unsteadiness on feet (R26.81);Muscle weakness (generalized) (M62.81) Hemiplegia - Right/Left: Right Hemiplegia - dominant/non-dominant: Dominant Hemiplegia - caused by: Cerebral infarction     Time: 8185-6314 PT Time Calculation (min) (ACUTE ONLY): 17 min  Charges:  $Gait Training: 8-22 mins                     Terry Mayo PT, DPT  Terry Mayo 12/16/2021, 9:52 AM

## 2021-12-16 NOTE — Discharge Summary (Addendum)
Stroke Discharge Summary  Patient ID: Terry Mayo   MRN: [id number]      DOB: [birth year]  Date of Admission: 12/09/2021 Date of Discharge: 12/16/2021  Attending Physician:  Stroke, Md, MD, Stroke MD Consultant(s):    cardiology  Patient's PCP:  [facility]  DISCHARGE DIAGNOSIS:  Principal Problem:   Stroke: Acute Left MCA ischemic infarct  due to left tICA and MCA occlusion s/p TNK and IR with TICI3. Etiology unclear, concerning for cardioembolic source  Active Problems:   CAD/NSTEMI   Cardiomyopathy   HTN   HLD   Dysphagia    THC abuse   Leukocytosis    Allergies as of 12/16/2021       Reactions   Penicillins    REACTION UNKNOWN Has patient had a PCN reaction causing immediate rash, facial/tongue/throat swelling, SOB or lightheadedness with hypotension: UNKNOWN Has patient had a PCN reaction causing severe rash involving mucus membranes or skin necrosis: UNKNOWN Has patient had a PCN reaction that required hospitalization UNKNOWN Has patient had a PCN reaction occurring within the last 10 years: UNKNOWN If all of the above answers are "NO", then may proceed with Cephalosporin use.        Medication List     TAKE these medications    acetaminophen 650 MG CR tablet Commonly known as: TYLENOL Take 650 mg by mouth every 8 (eight) hours as needed for pain.   aspirin EC 81 MG tablet Take 1 tablet (81 mg total) by mouth daily. Swallow whole.   atorvastatin 40 MG tablet Commonly known as: LIPITOR Take 40 mg by mouth daily.   clopidogrel 75 MG tablet Commonly known as: PLAVIX Take 1 tablet (75 mg total) by mouth daily with breakfast.   isosorbide mononitrate 30 MG 24 hr tablet Commonly known as: IMDUR Take 0.5 tablets (15 mg total) by mouth daily.   losartan 25 MG tablet Commonly known as: COZAAR Take 25 mg by mouth daily.   metoprolol succinate 25 MG 24 hr tablet Commonly known as: TOPROL-XL Take 0.5 tablets (12.5 mg total) by  mouth daily.   nitroGLYCERIN 0.4 MG SL tablet Commonly known as: NITROSTAT Place 1 tablet (0.4 mg total) under the tongue every 5 (five) minutes x 3 doses as needed for chest pain.   spironolactone 25 MG tablet Commonly known as: ALDACTONE Take 0.5 tablets (12.5 mg total) by mouth daily.        LABORATORY STUDIES CBC    Component Value Date/Time   WBC 11.1 (H) 12/15/2021 0400   RBC 4.30 12/15/2021 0400   HGB 14.0 12/15/2021 0400   HCT 39.6 12/15/2021 0400   PLT 191 12/15/2021 0400   MCV 92.1 12/15/2021 0400   MCH 32.6 12/15/2021 0400   MCHC 35.4 12/15/2021 0400   RDW 13.3 12/15/2021 0400   LYMPHSABS 5.3 (H) 12/09/2021 2235   MONOABS 0.8 12/09/2021 2235   EOSABS 0.3 12/09/2021 2235   BASOSABS 0.0 12/09/2021 2235   CMP    Component Value Date/Time   NA 138 12/15/2021 0400   K 4.0 12/15/2021 0400   CL 107 12/15/2021 0400   CO2 23 12/15/2021 0400   GLUCOSE 109 (H) 12/15/2021 0400   BUN 17 12/15/2021 0400   CREATININE 1.13 12/15/2021 0400   CALCIUM 8.5 (L) 12/15/2021 0400   PROT 6.2 (L) 12/13/2021 0204   ALBUMIN 3.0 (L) 12/13/2021 0204   AST 41 12/13/2021 0204   ALT 65 (H) 12/13/2021 6438  ALKPHOS 68 12/13/2021 0204   BILITOT 0.7 12/13/2021 0204   GFRNONAA >60 12/15/2021 0400   GFRAA >60 09/26/2015 0800   COAGS Lab Results  Component Value Date   INR 1.1 12/09/2021   Lipid Panel    Component Value Date/Time   CHOL 99 12/05/2021 0317   TRIG 58 12/11/2021 0612   HDL 50 12/05/2021 0317   CHOLHDL 2.0 12/05/2021 0317   VLDL 8 12/05/2021 0317   LDLCALC 41 12/05/2021 0317   HgbA1C  Lab Results  Component Value Date   HGBA1C 5.5 12/05/2021   Urinalysis    Component Value Date/Time   COLORURINE YELLOW 12/10/2021 0916   APPEARANCEUR HAZY (A) 12/10/2021 0916   LABSPEC 1.033 (H) 12/10/2021 0916   PHURINE 6.0 12/10/2021 0916   GLUCOSEU NEGATIVE 12/10/2021 0916   HGBUR MODERATE (A) 12/10/2021 0916   BILIRUBINUR NEGATIVE 12/10/2021 0916   KETONESUR 5 (A)  12/10/2021 0916   PROTEINUR 30 (A) 12/10/2021 0916   UROBILINOGEN 1.0 10/12/2007 1217   NITRITE NEGATIVE 12/10/2021 0916   LEUKOCYTESUR NEGATIVE 12/10/2021 0916   Urine Drug Screen     Component Value Date/Time   LABOPIA NONE DETECTED 12/10/2021 0916   COCAINSCRNUR NONE DETECTED 12/10/2021 0916   LABBENZ NONE DETECTED 12/10/2021 0916   AMPHETMU NONE DETECTED 12/10/2021 0916   THCU POSITIVE (A) 12/10/2021 0916   LABBARB NONE DETECTED 12/10/2021 0916    Alcohol Level    Component Value Date/Time   ETH <10 12/09/2021 2235     SIGNIFICANT DIAGNOSTIC STUDIES DG Swallowing Func-Speech Pathology  Result Date: 12/11/2021 Table formatting from the original result was not included. Objective Swallowing Evaluation: Type of Study: MBS-Modified Barium Swallow Study  Patient Details Name: Terry Mayo MRN: [id number] Date of Birth: 1955-05-20 Today's Date: 12/11/2021 Time: SLP Start Time (ACUTE ONLY): 1345 -SLP Stop Time (ACUTE ONLY): 1400 SLP Time Calculation (min) (ACUTE ONLY): 15 min Past Medical History: Past Medical History: Diagnosis Date  Arthritis   Essential hypertension   Hyperlipidemia   Kidney stones  Past Surgical History: Past Surgical History: Procedure Laterality Date  CHOLECYSTECTOMY  1990s  COLONOSCOPY WITH PROPOFOL N/A 09/30/2015  Procedure: COLONOSCOPY WITH PROPOFOL;  Surgeon: Daneil Dolin, MD;  [facility];  Service: Endoscopy;  Laterality: N/A;  930  IR CT HEAD LTD  12/10/2021  IR PERCUTANEOUS ART THROMBECTOMY/INFUSION INTRACRANIAL INC DIAG ANGIO  12/10/2021  IR US GUIDE VASC ACCESS RIGHT  12/10/2021  KIDNEY STONE SURGERY  2005  LEFT HEART CATH AND CORONARY ANGIOGRAPHY N/A 12/04/2021  Procedure: LEFT HEART CATH AND CORONARY ANGIOGRAPHY;  Surgeon: Belva Crome, MD;  Location: [facility];  Service: Cardiovascular;  Laterality: N/A;  RADIOLOGY WITH ANESTHESIA N/A 12/10/2021  Procedure: IR WITH ANESTHESIA;  Surgeon: Luanne Bras, MD;  Location: [facility];  Service:  Radiology;  Laterality: N/A; HPI: Patient is a 66 y.o. male with PMH: HTN, HLD, former smoker, recent NSTEMI s/p cath (12/04/21). He was reportedly doing well since cath procedure until he developed acute onset right sided weakness and dysarthria. He presented to the ED for evaluation and was administered TNK. CT head did not show any acute intracranial abnormality but CT angio head neck showed definite opacification of the left MCA and its branches; Infarct core in the left MCA territory. MRI pending. He was intubted on 12/10/21 for thrombectomy at 0130 and extubated same day 0938.  Subjective: awake, alert  Recommendations for follow up therapy are one component of a multi-disciplinary discharge planning process, led  by the attending physician.  Recommendations may be updated based on patient status, additional functional criteria and insurance authorization. Assessment / Plan / Recommendation   12/11/2021   2:57 PM Clinical Impressions Clinical Impression Patient presents with a mild-modeate oropharyngeal dysphagia as per this MBS. During oral phase, he exhibited weak lingual manipulation and decreased anterior to posterior transit as well as piecemeal swallowing of all liquids and solids. Mastication of mechanical soft solids was significantly delayed. During pharyngeal phase, patient exhibited swallow initiation delays to level of vallecular sinus with mechanical soft solids and honey thick liquids and delays to level of pyriform sinus with thin and nectar thick liquids. Trace amount of residuals observed in vallecular sinus s/p initial swallows of nectar thick liquids and trace residuals in both vallecular and pyriform sinus observed with honey thick liquids. With thin liquids, patient initially did not have any residuals post swallow but he then had pooling of oral residuals of thin into vallecular and pyriform sinus without sensation, requiring cue for him to swallow. No penetration or aspiration observed with  any of the tested barium consistencies. SLP is recommending to start with PO diet of Dys 1 (puree ) solids and honey thick liquids. SLP Visit Diagnosis Dysphagia, oropharyngeal phase (R13.12) Impact on safety and function Mild aspiration risk     12/11/2021   2:57 PM Treatment Recommendations Treatment Recommendations Therapy as outlined in treatment plan below     12/11/2021   3:03 PM Prognosis Prognosis for Safe Diet Advancement Good Barriers to Reach Goals Time post onset;Severity of deficits   12/11/2021   2:57 PM Diet Recommendations SLP Diet Recommendations Dysphagia 1 (Puree) solids;Honey thick liquids Liquid Administration via Cup Medication Administration Crushed with puree Compensations Slow rate;Small sips/bites Postural Changes Seated upright at 90 degrees     12/11/2021   2:57 PM Other Recommendations Oral Care Recommendations Oral care BID;Oral care before and after PO Follow Up Recommendations Acute inpatient rehab (3hours/day) Assistance recommended at discharge Frequent or constant Supervision/Assistance Functional Status Assessment Patient has had a recent decline in their functional status and demonstrates the ability to make significant improvements in function in a reasonable and predictable amount of time.   12/11/2021   2:57 PM Frequency and Duration  Speech Therapy Frequency (ACUTE ONLY) min 2x/week Treatment Duration 2 weeks     12/11/2021   2:46 PM Oral Phase Oral Phase Impaired Oral - Honey Cup Weak lingual manipulation;Delayed oral transit Oral - Nectar Cup Weak lingual manipulation;Piecemeal swallowing Oral - Thin Cup Piecemeal swallowing;Weak lingual manipulation Oral - Puree Weak lingual manipulation;Reduced posterior propulsion;Delayed oral transit;Piecemeal swallowing Oral - Mech Soft Reduced posterior propulsion;Impaired mastication;Weak lingual manipulation;Piecemeal swallowing    12/11/2021   2:56 PM Pharyngeal Phase Pharyngeal Phase Impaired Pharyngeal- Honey Cup Delayed swallow  initiation-vallecula;Pharyngeal residue - valleculae;Pharyngeal residue - pyriform Pharyngeal- Nectar Cup Delayed swallow initiation-pyriform sinuses;Pharyngeal residue - valleculae Pharyngeal- Thin Cup Delayed swallow initiation-pyriform sinuses;Pharyngeal residue - valleculae;Pharyngeal residue - pyriform Pharyngeal- Puree Pharyngeal residue - pyriform Pharyngeal- Mechanical Soft Delayed swallow initiation-vallecula    12/11/2021   2:57 PM Cervical Esophageal Phase  Cervical Esophageal Phase [facility] Sonia Baller, MA, CCC-SLP Speech Therapy                     DG CHEST PORT 1 VIEW  Result Date: 12/11/2021 CLINICAL DATA:  Leukocytosis EXAM: PORTABLE CHEST 1 VIEW COMPARISON:  12/10/2021 FINDINGS: Interval endotracheal and esophagogastric extubation. Cardiomegaly. Both lungs are clear. The visualized skeletal structures are unremarkable. IMPRESSION: 1.  Interval extubation. 2. Cardiomegaly without acute abnormality of the lungs in [facility] portable projection. Electronically Signed   By: Delanna Ahmadi M.D.   On: 12/11/2021 08:17   MR BRAIN WO CONTRAST  Result Date: 12/11/2021 CLINICAL DATA:  Status post TNK EXAM: MRI HEAD WITHOUT CONTRAST TECHNIQUE: Multiplanar, multiecho pulse sequences of the brain and surrounding structures were obtained without intravenous contrast. COMPARISON:  None Available. FINDINGS: Brain: Large area of acute/subacute ischemia within the left MCA territory. No acute or chronic hemorrhage. Normal white matter signal, parenchymal volume and CSF spaces. The midline structures are normal. Vascular: Major flow voids are preserved. Skull and upper cervical spine: Normal calvarium and skull base. Visualized upper cervical spine and soft tissues are normal. Sinuses/Orbits:No paranasal sinus fluid levels or advanced mucosal thickening. No mastoid or middle ear effusion. Normal orbits. IMPRESSION: Large area of acute/subacute ischemia within the left MCA territory. No hemorrhage or mass effect.  Electronically Signed   By: Ulyses Jarred M.D.   On: 12/11/2021 01:10   ECHOCARDIOGRAM LIMITED  Result Date: 12/10/2021    ECHOCARDIOGRAM LIMITED REPORT   Patient Name:   Terry Mayo Date of Exam: 12/10/2021 Medical Rec #:  [id number]    Height:       74.0 in Accession #:    [id number]   Weight:       200.0 lb Date of Birth:  [birth year]    BSA:          2.173 m Patient Age:    25 years     BP:           131/85 mmHg Patient Gender: M            HR:           85 bpm. Exam Location:  Inpatient Procedure: Limited Echo, Color Doppler and Cardiac Doppler                                 MODIFIED REPORT: This report was modified by Berniece Salines DO on 12/10/2021 due to document accurate                                      LVEF.  Indications:     Stroke i63.9  History:         Patient has prior history of Echocardiogram examinations, most                  recent 12/05/2021. CAD; Risk Factors:Hypertension and                  Dyslipidemia.  Sonographer:     Raquel Sarna Senior RDCS Sonographer#2:   Memory Argue RDCS Referring Phys:  [id number] Dutch Quint BHAGAT Diagnosing Phys: Berniece Salines DO  Sonographer Comments: Limited for stroke IMPRESSIONS  1. Left ventricular ejection fraction, by estimation, is 45 to 50%. The left ventricle has mildly decreased function. Left ventricular endocardial border not optimally defined to evaluate regional wall motion. Left ventricular diastolic function could not be evaluated.  2. The mitral valve is grossly normal. Trivial mitral valve regurgitation. No evidence of mitral stenosis.  3. The aortic valve was not well visualized. Aortic valve regurgitation is not visualized. No aortic stenosis is present.  4. The inferior vena cava is normal in size with greater than 50% respiratory variability, suggesting right atrial pressure  of 3 mmHg. FINDINGS  Left Ventricle: Left ventricular ejection fraction, by estimation, is 45 to 50%. The left ventricle has mildly decreased function. Left ventricular  endocardial border not optimally defined to evaluate regional wall motion. Left ventricular diastolic function could not be evaluated. Pericardium: Presence of epicardial fat layer. Mitral Valve: The mitral valve is grossly normal. Trivial mitral valve regurgitation. No evidence of mitral valve stenosis. Tricuspid Valve: The tricuspid valve is not well visualized. Tricuspid valve regurgitation is not demonstrated. Aortic Valve: The aortic valve was not well visualized. Aortic valve regurgitation is not visualized. No aortic stenosis is present. Aorta: The aortic root was not well visualized. Venous: The inferior vena cava is normal in size with greater than 50% respiratory variability, suggesting right atrial pressure of 3 mmHg. IAS/Shunts: No atrial level shunt detected by color flow Doppler.  LV Volumes (MOD) LV vol d, MOD A2C: 79.6 ml LV vol d, MOD A4C: 116.0 ml LV vol s, MOD A2C: 43.6 ml LV vol s, MOD A4C: 47.1 ml LV SV MOD A2C:     36.0 ml LV SV MOD A4C:     116.0 ml LV SV MOD BP:      50.0 ml Kardie Tobb DO Electronically signed by Berniece Salines DO Signature Date/Time: 12/10/2021/12:36:27 PM    Final (Updated)    IR PERCUTANEOUS ART THROMBECTOMY/INFUSION INTRACRANIAL INC DIAG ANGIO  Result Date: 12/10/2021 INDICATION: 66 year old male who presented to [facility] with right-sided weakness and dysarthria. His past medical history significant for hypertension, hyperlipidemia, former smoking, BMI 25.67, recent NSTEMI. Head CT showed hypodensity of the left insular cortical ribbon and basal ganglia (ASPECTS 8), no hemorrhage. Intravenous TNK was then administered. CT angiogram of the head and neck showed an occlusion of the left ICA terminus. He was then transferred to our service for a diagnostic cerebral angiogram and mechanical thrombectomy. EXAM: ULTRASOUND-GUIDED VASCULAR ACCESS DIAGNOSTIC CEREBRAL ANGIOGRAM MECHANICAL THROMBECTOMY FLAT PANEL HEAD CT COMPARISON:  CT/CT angiogram of the head and neck  December 09, 2021. MEDICATIONS: No antibiotics administered. ANESTHESIA/SEDATION: The procedure was performed under general anesthesia. CONTRAST:  70 mL of Omnipaque 300 milligram/mL FLUOROSCOPY: Radiation Exposure Index (as provided by the fluoroscopic device): 024.0 mGy Kerma COMPLICATIONS: None immediate. TECHNIQUE: Informed written consent was obtained from the patient's daughter after a thorough discussion of the procedural risks, benefits and alternatives. All questions were addressed. Maximal Sterile Barrier Technique was utilized including caps, mask, sterile gowns, sterile gloves, sterile drape, hand hygiene and skin antiseptic. A timeout was performed prior to the initiation of the procedure. The right groin was prepped and draped in the usual sterile fashion. Using a micropuncture kit and the modified Seldinger technique, access was gained to the right common femoral artery and an 8 French sheath was placed. Real-time ultrasound guidance was utilized for vascular access including the acquisition of a permanent ultrasound image documenting patency of the accessed vessel. Under fluoroscopy, a Zoom 88 guide catheter was navigated over a 6 [place] Berenstein 2 catheter and a 0.035" Terumo Glidewire into the aortic arch. Attempt to catheterize the left common carotid artery proved unsuccessful. The Berenstein 2 catheter was then exchanged for a VTK catheter. The catheter was placed into the left common carotid artery and then advanced into the left internal carotid artery. The diagnostic catheter was removed. Frontal and lateral angiograms of the head were obtained. FINDINGS: 1. Normal caliber of the right common femoral artery, adequate for vascular access. 2. Occlusion of the left ICA terminus just distal to  the origin of the left posterior communicating artery. 3. Severe tortuosity of the cervical left ICA and proximal left common carotid artery. PROCEDURE: Using biplane roadmap, a zoom 71 aspiration catheter  was navigated over Colossus 35 microguidewire into the cavernous segment of the left ICA. The aspiration catheter was then advanced to the level of occlusion and connected to an aspiration pump. Continuous aspiration was performed for 2 minutes. The guide catheter was connected to a VacLok syringe. The aspiration catheter was subsequently removed under constant aspiration. The guide catheter was aspirated for debris. Left internal carotid artery angiogram showed recanalization of the left ICA terminus with patent but stenotic proximal left M1/MCA related to residual thrombus with slow distal flow (TICI 2C). Using biplane roadmap, a zoom 71 aspiration catheter was navigated over Colossus 35 microguidewire into the cavernous segment of the left ICA. The aspiration catheter was then advanced to the level of occlusion in the M1 segment and connected to an aspiration pump. Continuous aspiration was performed for 2 minutes. The guide catheter was connected to a VacLok syringe. The aspiration catheter was subsequently removed under constant aspiration. The guide catheter was aspirated for debris. Left internal carotid artery angiogram with frontal and lateral views of the head showed complete recanalization of the left ICA, ACA and MCA vascular territories. Flat panel CT of the head was obtained and post processed in a separate workstation with concurrent attending physician supervision. Selected images were sent to PACS. No evidence of hemorrhagic complication. Delayed left internal carotid artery angiograms with frontal and lateral views of the head showed persistent patency of the left anterior circulation. The catheter was subsequently withdrawn. Right common femoral artery angiogram was obtained in right anterior oblique view. The puncture is at the level of the common femoral artery. The artery has normal caliber, adequate for closure device. The sheath was exchanged over the wire for a Perclose prostyle which was  utilized for access closure. Immediate hemostasis was achieved. IMPRESSION: 1. Successful mechanical thrombectomy for treatment of a left ICA terminus occlusion with direct contact aspiration achieving complete recanalization (TICI 3). 2. No thromboembolic or hemorrhagic complication. PLAN: Transfer to ICU for continued post stroke care. Electronically Signed   By: Pedro Earls M.D.   On: 12/10/2021 10:24   IR US Guide Vasc Access Right  Result Date: 12/10/2021 INDICATION: 66 year old male who presented to [facility] with right-sided weakness and dysarthria. His past medical history significant for hypertension, hyperlipidemia, former smoking, BMI 25.67, recent NSTEMI. Head CT showed hypodensity of the left insular cortical ribbon and basal ganglia (ASPECTS 8), no hemorrhage. Intravenous TNK was then administered. CT angiogram of the head and neck showed an occlusion of the left ICA terminus. He was then transferred to our service for a diagnostic cerebral angiogram and mechanical thrombectomy. EXAM: ULTRASOUND-GUIDED VASCULAR ACCESS DIAGNOSTIC CEREBRAL ANGIOGRAM MECHANICAL THROMBECTOMY FLAT PANEL HEAD CT COMPARISON:  CT/CT angiogram of the head and neck December 09, 2021. MEDICATIONS: No antibiotics administered. ANESTHESIA/SEDATION: The procedure was performed under general anesthesia. CONTRAST:  70 mL of Omnipaque 300 milligram/mL FLUOROSCOPY: Radiation Exposure Index (as provided by the fluoroscopic device): 496.7 mGy Kerma COMPLICATIONS: None immediate. TECHNIQUE: Informed written consent was obtained from the patient's daughter after a thorough discussion of the procedural risks, benefits and alternatives. All questions were addressed. Maximal Sterile Barrier Technique was utilized including caps, mask, sterile gowns, sterile gloves, sterile drape, hand hygiene and skin antiseptic. A timeout was performed prior to the initiation of the procedure. The right  groin was prepped and  draped in the usual sterile fashion. Using a micropuncture kit and the modified Seldinger technique, access was gained to the right common femoral artery and an 8 French sheath was placed. Real-time ultrasound guidance was utilized for vascular access including the acquisition of a permanent ultrasound image documenting patency of the accessed vessel. Under fluoroscopy, a Zoom 88 guide catheter was navigated over a 6 [place] Berenstein 2 catheter and a 0.035" Terumo Glidewire into the aortic arch. Attempt to catheterize the left common carotid artery proved unsuccessful. The Berenstein 2 catheter was then exchanged for a VTK catheter. The catheter was placed into the left common carotid artery and then advanced into the left internal carotid artery. The diagnostic catheter was removed. Frontal and lateral angiograms of the head were obtained. FINDINGS: 1. Normal caliber of the right common femoral artery, adequate for vascular access. 2. 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 3. Severe tortuosity of the cervical left ICA and proximal left common carotid artery. PROCEDURE: Using biplane roadmap, a zoom 71 aspiration catheter was navigated over Colossus 35 microguidewire into the cavernous segment of the left ICA. The aspiration catheter was then advanced to the level of occlusion and connected to an aspiration pump. Continuous aspiration was performed for 2 minutes. The guide catheter was connected to a VacLok syringe. The aspiration catheter was subsequently removed under constant aspiration. The guide catheter was aspirated for debris. Left internal carotid artery angiogram showed recanalization of the left ICA terminus with patent but stenotic proximal left M1/MCA related to residual thrombus with slow distal flow (TICI 2C). Using biplane roadmap, a zoom 71 aspiration catheter was navigated over Colossus 35 microguidewire into the cavernous segment  of the left ICA. The aspiration catheter was then advanced to the level of occlusion in the M1 segment and connected to an aspiration pump. Continuous aspiration was performed for 2 minutes. The guide catheter was connected to a VacLok syringe. The aspiration catheter was subsequently removed under constant aspiration. The guide catheter was aspirated for debris. Left internal carotid artery angiogram with frontal and lateral views of the head showed complete recanalization of the left ICA, ACA and MCA vascular territories. Flat panel CT of the head was obtained and post processed in a separate workstation with concurrent attending physician supervision. Selected images were sent to PACS. No evidence of hemorrhagic complication. Delayed left internal carotid artery angiograms with frontal and lateral views of the head showed persistent patency of the left anterior circulation. The catheter was subsequently withdrawn. Right common femoral artery angiogram was obtained in right anterior oblique view. The puncture is at the level of the common femoral artery. The artery has normal caliber, adequate for closure device. The sheath was exchanged over the wire for a Perclose prostyle which was utilized for access closure. Immediate hemostasis was achieved. IMPRESSION: 1. Successful mechanical thrombectomy for treatment of a left ICA terminus occlusion with direct contact aspiration achieving complete recanalization (TICI 3). 2. No thromboembolic or hemorrhagic complication. PLAN: Transfer to ICU for continued post stroke care. Electronically Signed   By: Pedro Earls M.D.   On: 12/10/2021 10:24   IR CT Head Ltd  Result Date: 12/10/2021 INDICATION: 66 year old male who presented to [facility] with right-sided weakness and dysarthria. His past medical history significant for hypertension, hyperlipidemia, former smoking, BMI 25.67, recent NSTEMI. Head CT showed hypodensity of the left insular  cortical ribbon and basal  ganglia (ASPECTS 8), no hemorrhage. Intravenous TNK was then administered. CT angiogram of the head and neck showed an occlusion of the left ICA terminus. He was then transferred to our service for a diagnostic cerebral angiogram and mechanical thrombectomy. EXAM: ULTRASOUND-GUIDED VASCULAR ACCESS DIAGNOSTIC CEREBRAL ANGIOGRAM MECHANICAL THROMBECTOMY FLAT PANEL HEAD CT COMPARISON:  CT/CT angiogram of the head and neck December 09, 2021. MEDICATIONS: No antibiotics administered. ANESTHESIA/SEDATION: The procedure was performed under general anesthesia. CONTRAST:  70 mL of Omnipaque 300 milligram/mL FLUOROSCOPY: Radiation Exposure Index (as provided by the fluoroscopic device): 427.0 mGy Kerma COMPLICATIONS: None immediate. TECHNIQUE: Informed written consent was obtained from the patient's daughter after a thorough discussion of the procedural risks, benefits and alternatives. All questions were addressed. Maximal Sterile Barrier Technique was utilized including caps, mask, sterile gowns, sterile gloves, sterile drape, hand hygiene and skin antiseptic. A timeout was performed prior to the initiation of the procedure. The right groin was prepped and draped in the usual sterile fashion. Using a micropuncture kit and the modified Seldinger technique, access was gained to the right common femoral artery and an 8 French sheath was placed. Real-time ultrasound guidance was utilized for vascular access including the acquisition of a permanent ultrasound image documenting patency of the accessed vessel. Under fluoroscopy, a Zoom 88 guide catheter was navigated over a 6 [place] Berenstein 2 catheter and a 0.035" Terumo Glidewire into the aortic arch. Attempt to catheterize the left common carotid artery proved unsuccessful. The Berenstein 2 catheter was then exchanged for a VTK catheter. The catheter was placed into the left common carotid artery and then advanced into the left internal carotid artery.  The diagnostic catheter was removed. Frontal and lateral angiograms of the head were obtained. FINDINGS: 1. Normal caliber of the right common femoral artery, adequate for vascular access. 2. 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 3. Severe tortuosity of the cervical left ICA and proximal left common carotid artery. PROCEDURE: Using biplane roadmap, a zoom 71 aspiration catheter was navigated over Colossus 35 microguidewire into the cavernous segment of the left ICA. The aspiration catheter was then advanced to the level of occlusion and connected to an aspiration pump. Continuous aspiration was performed for 2 minutes. The guide catheter was connected to a VacLok syringe. The aspiration catheter was subsequently removed under constant aspiration. The guide catheter was aspirated for debris. Left internal carotid artery angiogram showed recanalization of the left ICA terminus with patent but stenotic proximal left M1/MCA related to residual thrombus with slow distal flow (TICI 2C). Using biplane roadmap, a zoom 71 aspiration catheter was navigated over Colossus 35 microguidewire into the cavernous segment of the left ICA. The aspiration catheter was then advanced to the level of occlusion in the M1 segment and connected to an aspiration pump. Continuous aspiration was performed for 2 minutes. The guide catheter was connected to a VacLok syringe. The aspiration catheter was subsequently removed under constant aspiration. The guide catheter was aspirated for debris. Left internal carotid artery angiogram with frontal and lateral views of the head showed complete recanalization of the left ICA, ACA and MCA vascular territories. Flat panel CT of the head was obtained and post processed in a separate workstation with concurrent attending physician supervision. Selected images were sent to PACS. No evidence of hemorrhagic complication. Delayed left internal  carotid artery angiograms with frontal and lateral views of the head showed persistent patency of the left anterior circulation. The catheter was  subsequently withdrawn. Right common femoral artery angiogram was obtained in right anterior oblique view. The puncture is at the level of the common femoral artery. The artery has normal caliber, adequate for closure device. The sheath was exchanged over the wire for a Perclose prostyle which was utilized for access closure. Immediate hemostasis was achieved. IMPRESSION: 1. Successful mechanical thrombectomy for treatment of a left ICA terminus occlusion with direct contact aspiration achieving complete recanalization (TICI 3). 2. No thromboembolic or hemorrhagic complication. PLAN: Transfer to ICU for continued post stroke care. Electronically Signed   By: Pedro Earls M.D.   On: 12/10/2021 10:24   DG CHEST PORT 1 VIEW  Result Date: 12/10/2021 CLINICAL DATA:  Intubation and OG tube placement. EXAM: PORTABLE CHEST 1 VIEW COMPARISON:  12/04/2021. FINDINGS: The heart size and mediastinal contours are within normal limits. Linear densities are present over the lateral right lung which may be due to external structures. No consolidation, effusion, or pneumothorax. The endotracheal tube terminates 7.4 cm above the carina. An enteric tube is seen in the stomach. No acute osseous abnormality. IMPRESSION: 1. No acute cardiopulmonary process. 2. Endotracheal and enteric tubes as described above. Electronically Signed   By: Brett Fairy M.D.   On: 12/10/2021 04:15   CT ANGIO HEAD NECK W WO CM W PERF (CODE STROKE)  Result Date: 12/09/2021 CLINICAL DATA:  Right-sided weakness, slurred speech, facial droop EXAM: CT ANGIOGRAPHY HEAD AND NECK CT PERFUSION BRAIN TECHNIQUE: Multidetector CT imaging of the head and neck was performed using the standard protocol during bolus administration of intravenous contrast. Multiplanar CT image reconstructions and MIPs were  obtained to evaluate the vascular anatomy. Carotid stenosis measurements (when applicable) are obtained utilizing NASCET criteria, using the distal internal carotid diameter as the denominator. Multiphase CT imaging of the brain was performed following IV bolus contrast injection. Subsequent parametric perfusion maps were calculated using RAPID software. RADIATION DOSE REDUCTION: This exam was performed according to the departmental dose-optimization program which includes automated exposure control, adjustment of the mA and/or kV according to patient size and/or use of iterative reconstruction technique. CONTRAST:  118m OMNIPAQUE IOHEXOL 350 MG/ML SOLN COMPARISON:  12/09/2021 CT head, no prior CTA FINDINGS: CT HEAD FINDINGS For noncontrast findings, please see same day CT head. CTA NECK FINDINGS Aortic arch: 4 vessel arch, with the origin of the left vertebral artery from the aorta. Imaged portion shows no evidence of aneurysm or dissection. No significant stenosis of the major arch vessel origins. Right carotid system: No evidence of dissection, occlusion, or hemodynamically significant stenosis (greater than 50%). Left carotid system: No evidence of dissection, occlusion, or hemodynamically significant stenosis (greater than 50%). Vertebral arteries: No evidence of dissection, occlusion, or hemodynamically significant stenosis (greater than 50%). Skeleton: No acute osseous abnormality. Other neck: Negative Upper chest: Centrilobular and paraseptal emphysema. No focal pulmonary opacity or pleural effusion. Review of the MIP images confirms the above findings CTA HEAD FINDINGS Anterior circulation: Focal cutoff of the left ICA near the terminus (series 6, image 97). The right ICA is patent to the terminus without significant stenosis. A1 segments patent, with the left A1 likely filling from retrograde flow or collaterals. The origin of the left A1 is not opacified. Normal anterior communicating artery. Anterior  cerebral arteries are patent to their distal aspects. The left MCA is not definitively opacified nor are its branches opacified. The right M1 and distal MCA branches are perfused without significant stenosis. MCA branches perfused and symmetric. Posterior circulation: Vertebral arteries  patent to the vertebrobasilar junction without stenosis. Posterior inferior cerebellar arteries patent proximally. Basilar patent to its distal aspect. Superior cerebellar arteries patent proximally. Patent P1 segments. PCAs perfused to their distal aspects without stenosis. The left posterior communicating artery is visualized. Venous sinuses: As permitted by contrast timing, patent. Anatomic variants: None significant. Review of the MIP images confirms the above findings CT Brain Perfusion Findings: ASPECTS: 10, although in retrospect there is some loss of definition of the insula. CBF (<30%) Volume: 6m Perfusion (Tmax>6.0s) volume: 3540mMismatch Volume: 29046mnfarction Location:Left frontal and lobe in the left MCA territory. IMPRESSION: 1. Occlusion of the distal left ICA (series 6, image 97), with no definite opacification of the left MCA and its branches. 2. Infarct core in the left MCA territory measures 69 mL, with penumbra measuring 359 mL with a mismatch volume of 290 mL. 3. No other hemodynamically significant stenosis in the head or neck. Code stroke imaging results were communicated on 12/09/2021 at 11:44 pm to provider ZAMMIT via telephone, who verbally acknowledged these results. Electronically Signed   By: AliMerilyn BabaD.   On: 12/09/2021 23:54   CT HEAD CODE STROKE WO CONTRAST  Result Date: 12/09/2021 CLINICAL DATA:  Code stroke.  Sudden onset weakness EXAM: CT HEAD WITHOUT CONTRAST TECHNIQUE: Contiguous axial images were obtained from the base of the skull through the vertex without intravenous contrast. RADIATION DOSE REDUCTION: This exam was performed according to the departmental dose-optimization  program which includes automated exposure control, adjustment of the mA and/or kV according to patient size and/or use of iterative reconstruction technique. COMPARISON:  None Available. FINDINGS: Brain: No evidence of acute infarction, hemorrhage, cerebral edema, mass, mass effect, or midline shift. No hydrocephalus or extra-axial collection. Vascular: No definite hyperdense vessel. Skull: Negative for fracture or focal lesion. Sinuses/Orbits: No acute finding. Other: The mastoid air cells are well aerated. ASPECTS ([facility] Score) - Ganglionic level infarction (caudate, lentiform nuclei, internal capsule, insula, M1-M3 cortex): 7 - Supraganglionic infarction (M4-M6 cortex): 3 Total score (0-10 with 10 being normal): 10 IMPRESSION: 1. No acute intracranial process. 2. ASPECTS is 10 Code stroke imaging results were communicated on 12/09/2021 at 10:44 pm to provider ZAMMIT via telephone, who verbally acknowledged these results. Electronically Signed   By: AliMerilyn BabaD.   On: 12/09/2021 22:46   ECHOCARDIOGRAM COMPLETE  Result Date: 12/05/2021    ECHOCARDIOGRAM REPORT   Patient Name:   Terry SKOLNICKte of Exam: 12/05/2021 Medical Rec #:  [id number] Height:       74.0 in Accession #:    [id number]:       200.0 lb Date of Birth:  5/210-15-1957 BSA:          2.173 m Patient Age:    [age]ars     BP:           128/83 mmHg Patient Gender: M            HR:           67 bpm. Exam Location:  Inpatient Procedure: 2D Echo Indications:    NSTEMI  History:        Patient has no prior history of Echocardiogram examinations.                 CAD.  Sonographer:    LauJohny ChessCS Referring Phys: [id number]. False tendon in the apical  LV of no clinical significance. Left ventricular ejection fraction, by estimation, is 45 to 50%. The left ventricle has mildly decreased function. The left ventricle demonstrates regional wall motion abnormalities (see scoring  diagram/findings for description). Left ventricular diastolic parameters are indeterminate. There is akinesis of the left ventricular, apical septal wall and anterior wall. There is hypokinesis of the left ventricular, mid anterior wall. There is  akinesis of the left ventricular, apical segment. There is akinesis of the left ventricular, mid anteroseptal wall.  2. Right ventricular systolic function is normal. The right ventricular size is normal. A Prominent moderator band is visualized. There is normal pulmonary artery systolic pressure. The estimated right ventricular systolic pressure is 37.9 mmHg.  3. The mitral valve is normal in structure. Trivial mitral valve regurgitation. No evidence of mitral stenosis.  4. The aortic valve is normal in structure. Aortic valve regurgitation is not visualized. No aortic stenosis is present.  5. The inferior vena cava is dilated in size with >50% respiratory variability, suggesting right atrial pressure of 8 mmHg. FINDINGS  Left Ventricle: False tendon in the apical LV of no clinical significance. Left ventricular ejection fraction, by estimation, is 45 to 50%. The left ventricle has mildly decreased function. The left ventricle demonstrates regional wall motion abnormalities. The left ventricular internal cavity size was normal in size. There is no left ventricular hypertrophy. Left ventricular diastolic parameters are indeterminate. Normal left ventricular filling pressure. Right Ventricle: The right ventricular size is normal. No increase in right ventricular wall thickness. Right ventricular systolic function is normal. There is normal pulmonary artery systolic pressure. The tricuspid regurgitant velocity is 2.52 m/s, and  with an assumed right atrial pressure of 8 mmHg, the estimated right ventricular systolic pressure is 02.4 mmHg. Left Atrium: Left atrial size was normal in size. Right Atrium: Right atrial size was normal in size. Pericardium: There is no evidence of  pericardial effusion. Mitral Valve: The mitral valve is normal in structure. Trivial mitral valve regurgitation. No evidence of mitral valve stenosis. Tricuspid Valve: The tricuspid valve is normal in structure. Tricuspid valve regurgitation is trivial. No evidence of tricuspid stenosis. Aortic Valve: The aortic valve is normal in structure. Aortic valve regurgitation is not visualized. No aortic stenosis is present. Pulmonic Valve: The pulmonic valve was normal in structure. Pulmonic valve regurgitation is not visualized. No evidence of pulmonic stenosis. Aorta: The aortic root is normal in size and structure. Venous: The inferior vena cava is dilated in size with greater than 50% respiratory variability, suggesting right atrial pressure of 8 mmHg. IAS/Shunts: No atrial level shunt detected by color flow Doppler. Additional Comments: A Prominent moderator band is visualized.  LEFT VENTRICLE PLAX 2D LVIDd:         5.30 cm     Diastology LVIDs:         4.00 cm     LV e' medial:    6.53 cm/s LV PW:         1.00 cm     LV E/e' medial:  11.5 LV IVS:        1.00 cm     LV e' lateral:   8.27 cm/s LVOT diam:     2.20 cm     LV E/e' lateral: 9.1 LV SV:         57 LV SV Index:   26 LVOT Area:     3.80 cm  LV Volumes (MOD) LV vol d, MOD A4C: 96.4 ml LV vol s, MOD A4C:  61.0 ml LV SV MOD A4C:     96.4 ml RIGHT VENTRICLE             IVC RV S prime:     12.00 cm/s  IVC diam: 2.50 cm TAPSE (M-mode): 1.9 cm LEFT ATRIUM             Index        RIGHT ATRIUM           Index LA diam:        3.40 cm 1.56 cm/m   RA Area:     17.10 cm LA Vol (A2C):   60.8 ml 27.97 ml/m  RA Volume:   44.20 ml  20.34 ml/m LA Vol (A4C):   50.9 ml 23.42 ml/m LA Biplane Vol: 61.5 ml 28.30 ml/m  AORTIC VALVE LVOT Vmax:   77.80 cm/s LVOT Vmean:  48.700 cm/s LVOT VTI:    0.150 m  AORTA Ao Root diam: 3.40 cm Ao Asc diam:  3.50 cm MITRAL VALVE               TRICUSPID VALVE MV Area (PHT): 2.87 cm    TR Peak grad:   25.4 mmHg MV Decel Time: 264 msec    TR  Vmax:        252.00 cm/s MV E velocity: 75.10 cm/s MV A velocity: 53.40 cm/s  SHUNTS MV E/A ratio:  1.41        Systemic VTI:  0.15 m                            Systemic Diam: 2.20 cm Fransico Him MD Electronically signed by Fransico Him MD Signature Date/Time: 12/05/2021/10:57:48 AM    Final    CARDIAC CATHETERIZATION  Result Date: 12/04/2021 CONCLUSIONS: On presentation to the Cath Lab, no chest discomfort since 6 AM this morning.  The left coronary anatomy is ambiguous because of multiple diagonals as ramus light branches.  But we can never find the stump.  There are faint hint of thrombus distal or around the origin of the large septal perforator.  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 Left main is widely patent Circumflex gives origin to 2 large obtuse marginal branches that arise from a common stent.  The more medial branch is larger and has moderate proximal disease up to 60%. Right coronary contains codominant anatomy and moderate mid vessel disease up to 60 to 70% in the mid vessel.  It supplies collaterals around the apex to the LAD. Left ventriculography demonstrates inferoapical and apical moderate hypokinesis.  EF 40 to 50%.  LVEDP upper normal at 17 mmHg. Recommend: Aspirin and Plavix for at least 6 months. Aggressive lipid-lowering.  Nitroglycerin if recurrent chest pain.   DG Chest Portable 1 View  Result Date: 12/04/2021 CLINICAL DATA:  Chest pain.  Shortness of breath. EXAM: PORTABLE CHEST 1 VIEW COMPARISON:  07/15/2015 FINDINGS: Lower lung volumes from prior exam. There is patchy ill-defined opacity in the right infrahilar lung. Heart size upper normal with normal mediastinal contours. No pulmonary edema. No pleural effusion or pneumothorax. No acute osseous abnormalities are seen. IMPRESSION: Patchy ill-defined opacity in the right infrahilar lung, may be atelectasis or pneumonia. Electronically Signed   By: Keith Rake M.D.   On: 12/04/2021  03:59      HISTORY OF PRESENT ILLNESS Mr. NIMAI BURBACH is a 67 y.o. male with history of  hypertension, hyperlipidemia, former smoking, recent NSTEMI s/p cath 12/04/2021 (medical management, no PCI). On 8/15 he presented to the ED when he developed acute onset right-sided weakness and dysarthria. He received IV TNK and then thrombectomy with Dr. Katherina Right Rodrgiuez for Left ICA terminus occulsion with TICI 3 result. Continue DAPT therapy with ASA 73m and Plavix 745m Loop recorder placement given unknown etiology of stroke and concern for cardio embolic source.   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 Etiology unclear, concerning for cardioembolic source CT negative.   CTA head and neck distal left ICA and left MCA occlusion.   Status post IR with TICI3 reperfusion.   MRI showed left MCA large infarct.   2D Echo EF 45-50% EP to place loop recorder prior to discharge LDL 41 HgbA1c 5.5 VTE prophylaxis -Lovenox aspirin 81 mg daily and clopidogrel 75 mg daily prior to admission, Will continue DAPT at discharge, duration per cardiology Therapy recommendations: SNF Disposition: Pending   CAD NSTEMI Cardiomyopathy 8/10 non-STEMI Discharged on DAPT EF 45 to 50% Continue DAPT and statin Outpatient follow-up with cardiology   Hypertension Home meds:  cozaar, imdur. Metoprolol Stable SBP goal <160 Long-term BP goal normotensive   Hyperlipidemia Home meds:  atorvastatin 40 LDL 41, goal < 70 LFT improved, resume Lipitor 40 Continue statin on discharge   Dysphagia Speech on board On dysphagia 1 and honey thick liquid -> now nectar thick liquid.  D/c IVF   Other Stroke Risk Factors Advanced Age >/= 6532Previous Cigarette smoker  Substance abuse - UDS:  THC  Patient advised to stop using due to stroke risk. Migraines   Other Active Problems Leukocytosis WBC 17.0->14.4->12.2->10.9->11.1     DISCHARGE EXAM Blood pressure 123/77, pulse 67, temperature 98.1 F (36.7 C), temperature source Oral, resp. rate 14, height _0  (1.88 m), weight 90.7 kg, SpO2 100 %. General - Well nourished, well developed, middle-aged African-American male in no apparent distress. Resp: Lungs clear, breathing rate normal Cardiovascular - Regular rhythm and rate.   NEURO: Sitting in chair, awake, alert, eyes open, orientated to age, place, time. No aphasia, paucity of speech but able to answer questions appropriately, following all simple commands. Able to name and repeat. Mild dysarthria. Left gaze preference and right gaze incomplete, tracking bilaterally, visual field full. Right facial droop. Tongue protrusion to the right. LUE 5/5, no drift, RUE proximal 1/5, bicep 2/5, tricep and finger grip 0/5. Bilaterally LEs 4/5, no drift. Sensation symmetrical bilaterally, left FTN intact, gait not tested.  Discharge Diet       Diet   DIET - DYS 1 Room service appropriate? Yes; Fluid consistency: Nectar Thick   liquids  DISCHARGE PLAN Disposition:  SNF- PeColespirin 81 mg daily and clopidogrel 75 mg daily for secondary stroke prevention Ongoing stroke risk factor control by Primary Care Physician at time of discharge Follow-up PCP [facility]. [facility], office to schedule an appointment.   35 minutes were spent preparing discharge.  Patient seen and examined by NP/APP with MD. MD to update note as needed.   DeJanine OresDNP, FNP-BC [facility] Pager: [phone] NOTE: I reviewed above note and agree with the assessment and plan. Pt was seen and examined.   No family at the bedside. Pt sitting in chair, neuro  stable still has right facial droop and right UE weakness. RLE much better. EP on board to have loop recorder placed today. Honey thick liquid now  advance to nectar thick. Pt had bed in SNF, will d/c after loop recorder. He will follow up at [place] in 4 weeks.   For detailed assessment and plan, please refer to above/below as I have made changes wherever appropriate.   Rosalin Hawking, MD PhD Stroke Neurology 12/16/2021 1:24 PM

## 2021-12-17 ENCOUNTER — Encounter (HOSPITAL_COMMUNITY): Payer: Self-pay | Admitting: Cardiology

## 2021-12-17 ENCOUNTER — Non-Acute Institutional Stay (SKILLED_NURSING_FACILITY): Payer: Medicare HMO | Admitting: Adult Health

## 2021-12-17 DIAGNOSIS — Z8673 Personal history of transient ischemic attack (TIA), and cerebral infarction without residual deficits: Secondary | ICD-10-CM

## 2021-12-17 DIAGNOSIS — E785 Hyperlipidemia, unspecified: Secondary | ICD-10-CM | POA: Diagnosis not present

## 2021-12-17 DIAGNOSIS — I10 Essential (primary) hypertension: Secondary | ICD-10-CM

## 2021-12-17 DIAGNOSIS — I25118 Atherosclerotic heart disease of native coronary artery with other forms of angina pectoris: Secondary | ICD-10-CM

## 2021-12-17 DIAGNOSIS — I255 Ischemic cardiomyopathy: Secondary | ICD-10-CM

## 2021-12-17 DIAGNOSIS — G8191 Hemiplegia, unspecified affecting right dominant side: Secondary | ICD-10-CM

## 2021-12-17 NOTE — Progress Notes (Signed)
Location:  [facility] Room Number: 143 Place of Service:  SNF (31)   CODE STATUS: full   Allergies  Allergen Reactions   Penicillins     REACTION UNKNOWN Has patient had a PCN reaction causing immediate rash, facial/tongue/throat swelling, SOB or lightheadedness with hypotension: UNKNOWN Has patient had a PCN reaction causing severe rash involving mucus membranes or skin necrosis: UNKNOWN Has patient had a PCN reaction that required hospitalization UNKNOWN Has patient had a PCN reaction occurring within the last 10 years: UNKNOWN If all of the above answers are "NO", then may proceed with Cephalosporin use.     Chief Complaint  Patient presents with   Hospitalization Follow-up    HPI:  He is a 66 year old man who has been hospitalized from 12-09-21 through 12-16-21. His medical history includes: history of smoking; HTN; CAD. He has had a recent NSTEMI on 12-04-21 with a cath performed. The decision was made for medical management no PCI. On 12-09-21 he presented to the ED for sudden right side weakness and dysarthria. He received IV TNK. 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 2-d echo: EF 45-50%. He had a loop recorder inserted. Hgb a1c 5.5; LDL 41.  Marland Kitchen CAD NSTEMI cardiomyopathy: will continue plavix and asa; EF 45-50% He is here for short term rehab to improve upon his independence with his adls. His goal is to return back home. 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: Hyperlipidemia unspecified hyperlipidemia type         Past Medical History:  Diagnosis Date   Arthritis    Essential hypertension     Hyperlipidemia    Kidney stones     Past Surgical History:  Procedure Laterality Date   CHOLECYSTECTOMY  1990s   COLONOSCOPY WITH PROPOFOL N/A 09/30/2015   Procedure: COLONOSCOPY WITH PROPOFOL;  Surgeon: Corbin Ade, MD;  Location: [facility];  Service: Endoscopy;  Laterality: N/A;  930   IR CT HEAD LTD  12/10/2021   IR PERCUTANEOUS ART THROMBECTOMY/INFUSION INTRACRANIAL INC DIAG ANGIO  12/10/2021   IR US GUIDE VASC ACCESS RIGHT  12/10/2021   KIDNEY STONE SURGERY  2005   LEFT HEART CATH AND CORONARY ANGIOGRAPHY N/A 12/04/2021   Procedure: LEFT HEART CATH AND CORONARY ANGIOGRAPHY;  Surgeon: Lyn Records, MD;  Location: [facility] INVASIVE CV LAB;  Service: Cardiovascular;  Laterality: N/A;   LOOP RECORDER INSERTION N/A 12/16/2021   Procedure: LOOP RECORDER INSERTION;  Surgeon: Regan Lemming, MD;  Location: [facility] INVASIVE CV LAB;  Service: Cardiovascular;  Laterality: N/A;   RADIOLOGY WITH ANESTHESIA N/A 12/10/2021   Procedure: IR WITH ANESTHESIA;  Surgeon: Julieanne Cotton, MD;  Location: [facility] OR;  Service: Radiology;  Laterality: N/A;    Social History   Socioeconomic History   Marital status: Single    Spouse name: Not on file   Number of children: Not on file   Years of education: Not on file   Highest education level: Not on file  Occupational History   Not on file  Tobacco Use   Smoking status: Former    Types: Cigarettes    Quit date: 08/05/2015  Years since quitting: 6.3   Smokeless tobacco: Never  Vaping Use   Vaping Use: Never used  Substance and Sexual Activity   Alcohol use: Not Currently    Alcohol/week: 0.0 standard drinks of alcohol    Comment: No ETOH in a month. Variable, up to daily, as much as much as a fifth of liquor per sitting.   Drug use: No    Frequency: 7.0 times per week    Comment: As often as daily when he does smoke marijuana   Sexual activity: Yes    Birth control/protection: None  Other Topics Concern   Not on file  Social History Narrative    Not on file   Social Determinants of Health   Financial Resource Strain: Not on file  Food Insecurity: Not on file  Transportation Needs: Not on file  Physical Activity: Not on file  Stress: Not on file  Social Connections: Not on file  Intimate Partner Violence: Not on file   Family History  Problem Relation Age of Onset   Coronary artery disease Sister    Colon cancer Neg Hx       VITAL SIGNS BP 102/62   Pulse 64   Temp 97.9 F (36.6 C)   Resp 20   Ht 6\' 2"  (1.88 m)   Wt 185 lb 12.8 oz (84.3 kg)   SpO2 95%   BMI 23.86 kg/m   Outpatient Encounter Medications as of 12/17/2021  Medication Sig   isosorbide dinitrate (ISORDIL) 20 MG tablet Take 20 mg by mouth 2 (two) times daily.   metoprolol tartrate (LOPRESSOR) 25 MG tablet Take 6.25 mg by mouth 2 (two) times daily.   acetaminophen (TYLENOL) 650 MG CR tablet Take 650 mg by mouth every 8 (eight) hours as needed for pain.   aspirin EC 81 MG tablet Take 1 tablet (81 mg total) by mouth daily. Swallow whole.   atorvastatin (LIPITOR) 40 MG tablet Take 40 mg by mouth daily.   clopidogrel (PLAVIX) 75 MG tablet Take 1 tablet (75 mg total) by mouth daily with breakfast.   losartan (COZAAR) 25 MG tablet Take 25 mg by mouth daily.   nitroGLYCERIN (NITROSTAT) 0.4 MG SL tablet Place 1 tablet (0.4 mg total) under the tongue every 5 (five) minutes x 3 doses as needed for chest pain.   spironolactone (ALDACTONE) 25 MG tablet Take 0.5 tablets (12.5 mg total) by mouth daily.   [DISCONTINUED] isosorbide mononitrate (IMDUR) 30 MG 24 hr tablet Take 0.5 tablets (15 mg total) by mouth daily.   [DISCONTINUED] metoprolol succinate (TOPROL-XL) 25 MG 24 hr tablet Take 0.5 tablets (12.5 mg total) by mouth daily.   No facility-administered encounter medications on file as of 12/17/2021.     SIGNIFICANT DIAGNOSTIC EXAMS  TODAY  12-09-21: ct head:  1. No acute intracranial process. 2. ASPECTS is 10  12-09-21: ct angio of head:  1. Occlusion  of the distal left ICA (series 6, image 97), with no definite opacification of the left MCA and its branches. 2. Infarct core in the left MCA territory measures 69 mL, with penumbra measuring 359 mL with a mismatch volume of 290 mL. 3. No other hemodynamically significant stenosis in the head or neck.  12-11-21: MRI head:  Large area of acute/subacute ischemia within the left MCA territory. No hemorrhage or mass effect  LABS REVIEWED:   12-05-21: hgb a1c 5.5; chol 99; ldl 41; trig 41; hdl 50  12-09-21: wbc 11.1; hgb 15.8; hct 45.0; mvc 93.2 pt  196 glucose 124; bun 20; creat 1.21; k+ 3.7; na++ 138; ca 8.7; gfr >60 protein 7.7; albumin 4.0; ast 63; alt 85 12-12-21: wbc 14.4; hgb 13.8; hct 39.5; mcv 92.6  plt 170; glucose 117; bun 19; creat 1.22; k+ 3.9; na++ 139; ca 8.3; gfr >60 12-13-21: protein 6.2; albumin 3.0; alt 65 12-15-21; wbc 11.1; hgb 14.0; hct 39.6; mcv 92.1 plt 191; glucose 109; bun 17 creat 1.13; k+ 4.0; na++ 138; ca 8.5; gfr >60    Review of Systems  Constitutional:  Negative for malaise/fatigue.  Respiratory:  Negative for cough and shortness of breath.   Cardiovascular:  Negative for chest pain, palpitations and leg swelling.  Gastrointestinal:  Negative for abdominal pain, constipation and heartburn.  Musculoskeletal:  Negative for back pain, joint pain and myalgias.  Skin: Negative.   Neurological:  Negative for dizziness.  Psychiatric/Behavioral:  The patient is not nervous/anxious.      Physical Exam Constitutional:      General: He is not in acute distress.    Appearance: He is well-developed. He is not diaphoretic.  Neck:     Thyroid: No thyromegaly.  Cardiovascular:     Rate and Rhythm: Normal rate and regular rhythm.     Pulses: Normal pulses.     Heart sounds: Normal heart sounds.  Pulmonary:     Effort: Pulmonary effort is normal. No respiratory distress.     Breath sounds: Normal breath sounds.  Abdominal:     General: Bowel sounds are normal. There is no  distension.     Palpations: Abdomen is soft.     Tenderness: There is no abdominal tenderness.  Musculoskeletal:     Cervical back: Neck supple.     Right lower leg: No edema.     Left lower leg: No edema.     Comments: Unable to move right upper extremity Is able to move other extremities Is ambulating with a walker with arm rest with therapy   Lymphadenopathy:     Cervical: No cervical adenopathy.  Skin:    General: Skin is warm and dry.     Comments: Has facial droop; has difficulty finding some words   Neurological:     Mental Status: He is alert. Mental status is at baseline.  Psychiatric:        Mood and Affect: Mood normal.      ASSESSMENT/ PLAN:  TODAY  History of ischemic left ICA stroke/right hemiplegia: will continue therapy to improve upon his level of independence with his adls. Will continue asa 81 mg daily plavix 75 mg daily   2. Ischemic cardiomyopathy:EF 45-50% (12-10-21); is on aldactone 12.5 mg daily lopressor 6.25 mg twice daily isordil 20 mg twice daily   3. Primary hypertension: b/p 102/62: will continue lopressor 6.25 mg twice daily; isordil 20 mg twice daily; aldactone 12.5 mg daily   4. 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 Hyperlipidemia unspecified hyperlipidemia type: ldl 41 will continue lipitor 40 mg daily     Ok Edwards NP [facility]   call [phone]

## 2021-12-18 ENCOUNTER — Telehealth: Payer: Self-pay

## 2021-12-18 ENCOUNTER — Encounter: Payer: Self-pay | Admitting: Internal Medicine

## 2021-12-18 ENCOUNTER — Non-Acute Institutional Stay (SKILLED_NURSING_FACILITY): Payer: Medicare HMO | Admitting: Internal Medicine

## 2021-12-18 DIAGNOSIS — Z8673 Personal history of transient ischemic attack (TIA), and cerebral infarction without residual deficits: Secondary | ICD-10-CM

## 2021-12-18 DIAGNOSIS — I10 Essential (primary) hypertension: Secondary | ICD-10-CM

## 2021-12-18 DIAGNOSIS — G8191 Hemiplegia, unspecified affecting right dominant side: Secondary | ICD-10-CM | POA: Diagnosis not present

## 2021-12-18 DIAGNOSIS — I25118 Atherosclerotic heart disease of native coronary artery with other forms of angina pectoris: Secondary | ICD-10-CM | POA: Diagnosis not present

## 2021-12-18 NOTE — Telephone Encounter (Signed)
Loop recorder monitor updated today,  Patient at [facility] left message for nurse to call back regarding incision site wound care.

## 2021-12-18 NOTE — Assessment & Plan Note (Addendum)
He denies anginal equivalents at this time, but he is on Isordil twice daily prophylactically.  Risk factors for recurrent MI discussed with him and his wife.

## 2021-12-18 NOTE — Assessment & Plan Note (Signed)
BP controlled; no change in antihypertensive medications  

## 2021-12-18 NOTE — Telephone Encounter (Signed)
-----   Message from Graciella Freer, PA-C sent at 12/16/2021  3:30 PM EDT ----- Loop recorder 8/22 WC

## 2021-12-18 NOTE — Assessment & Plan Note (Addendum)
At this time he exhibits dysarthria and essentially no strength to opposition of the right upper extremity.  PT/OT as tolerated.  Risk factors for recurrent stroke and MI discussed with him and his wife.

## 2021-12-18 NOTE — Progress Notes (Signed)
NURSING HOME LOCATION:  [facility] ROOM NUMBER:  [id number] P  CODE STATUS: Full Code  PCP: [facility]  This is a comprehensive admission note to this SNFperformed on this date less than 30 days from date of admission. Included are preadmission medical/surgical history; reconciled medication list; family history; social history and comprehensive review of systems.  Corrections and additions to the records were documented. Comprehensive physical exam was also performed. Additionally a clinical summary was entered for each active diagnosis pertinent to this admission in the Problem List to enhance continuity of care.  HPI: He was hospitalized 8/15 - 12/16/2021 with acute left MCA ischemic infarct due to left PICA and MCA occlusion.   This was in the context of a recent NSTEMI for which cath was performed 12/04/2021.  Medical management without PCI was recommended at that time.   He subsequently presented to the ED 8/15 with acute right-sided weakness and dysarthria.  TNK followed by thrombectomy was performed by Dr. Salvadore Dom for left ICA terminus occlusion with TICI 3 result.   Etiology was not definitely identified but cardioembolic source was suspected.  DAPT therapy with aspirin 81 mg and Plavix 75 mg was recommended.  Loop recorder was placed for further etiologic evaluation. Labs revealed leukocytosis with a peak white count of 17,000; this gradually subsided with a final value of 11,100.  He developed a minor normochromic, normocytic anemia with hematocrit of 38.9 but this normalized prior to discharge.  Transaminitis was present with an AST of 63 and ALT of 85.  Subsequently AST was high normal at 41 and ALT 65.  AKI was present with a creatinine 1.52 and GFR of 50 indicating stage IIIa.  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  Protein/caloric malnutrition was suggested by an albumin of 3 and total protein of  6.2. Glucoses while hospitalized ranged from a low of 81 up to a high of 152.  A1c was prediabetic with a value of 5.5%. PT/OT recommended placement in SNF for rehab.  Pured diet was prescribed by ST.  Past medical and surgical history includes degenerative joint disease, essential hypertension, dyslipidemia, history of nephrolithiasis, cardiomyopathy and history of migraines.   Surgeries and procedures include cholecystectomy, colonoscopy, and renal calculus surgery.  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 Former smoker.  He seems to indicate that he smoked 10 joints a day. He is retired.  Family history: Limited history reviewed.  His wife believes that his father had significant health problems but they cannot define specifically what they were.  He denies any family history of cancer or diabetes.   Review of systems: His wife states that he was profoundly fatigued and was having headaches prior to the admission.  He denies these symptoms now and actually denies any other active symptomatology.  He states that he is doing "pretty good.".  He was constipated for a week but that resolved as of today.  He describes some involuntary movement of the right upper extremity which remains profoundly weak.  Constitutional: No fever, significant weight change  Eyes: No redness, discharge, pain, vision change ENT/mouth: No nasal congestion, purulent discharge, earache, change in hearing, sore throat  Cardiovascular: No chest pain, palpitations, paroxysmal nocturnal dyspnea, claudication, edema  Respiratory: No cough, sputum production, hemoptysis, DOE, significant snoring, apnea  Gastrointestinal: No heartburn, dysphagia, abdominal pain, nausea /vomiting, rectal bleeding, melena Genitourinary: No dysuria, hematuria, pyuria, incontinence, nocturia Musculoskeletal: No  joint stiffness, joint swelling,  pain Dermatologic: No rash, pruritus, change in appearance of skin Neurologic: No  dizziness, syncope, seizures, numbness, tingling Psychiatric: No significant anxiety, depression, insomnia, anorexia Endocrine: No change in hair/skin/nails, excessive thirst, excessive hunger, excessive urination  Hematologic/lymphatic: No significant bruising, lymphadenopathy, abnormal bleeding Allergy/immunology: No itchy/watery eyes, significant sneezing, urticaria, angioedema  Physical exam:  Pertinent or positive findings: He appears somewhat chronically ill.  Pattern alopecia is present.  He has a closely trimmed beard and mustache.  He exhibits dysarthria.  Arcus senilis is present.  The corner of the right mouth tends to droop.  He is missing multiple teeth and the remaining teeth are heavily plaque encrusted.  Heart sounds are somewhat distant, heard best at the lower left sternal border.  Breath sounds are also decreased.  Pedal pulses are surprisingly good.  He exhibits no strength to opposition in the right upper extremity.  The other extremities are strong.  He has tattoos over the deltoid bilaterally.  General appearance:  no acute distress, increased work of breathing is present.   Lymphatic: No lymphadenopathy about the head, neck, axilla. Eyes: No conjunctival inflammation or lid edema is present. There is no scleral icterus. Ears:  External ear exam shows no significant lesions or deformities.   Nose:  External nasal examination shows no deformity or inflammation. Nasal mucosa are pink and moist without lesions, exudates Oral exam: Lips and gums are healthy appearing.There is no oropharyngeal erythema or exudate. Neck:  No thyromegaly, masses, tenderness noted.    Heart:  No gallop, murmur, click, rub.  Lungs:  without wheezes, rhonchi, rales, rubs. Abdomen: Bowel sounds are normal.  Abdomen is soft and nontender with no organomegaly, hernias, masses. GU: Deferred  Extremities:  No cyanosis, clubbing, edema. Neurologic exam:  Balance, Rhomberg, finger to nose testing could not  be completed due to clinical state Skin: Warm & dry w/o tenting. No significant lesions or rash.  See clinical summary under each active problem in the Problem List with associated updated therapeutic plan

## 2021-12-18 NOTE — Patient Instructions (Signed)
See assessment and plan under each diagnosis in the problem list and acutely for this visit 

## 2021-12-18 NOTE — Assessment & Plan Note (Addendum)
No strength to opposition in the right upper extremity.  PT/OT has applied a K tape to R shoulder for support.

## 2021-12-18 NOTE — Telephone Encounter (Signed)
Spoke with nurse at [facility] nursing informed her to monitor the incision site for redness, swelling and to call office with fever or chills

## 2021-12-24 ENCOUNTER — Non-Acute Institutional Stay (SKILLED_NURSING_FACILITY): Payer: Medicare HMO | Admitting: Adult Health

## 2021-12-24 ENCOUNTER — Other Ambulatory Visit: Payer: Self-pay

## 2021-12-24 ENCOUNTER — Encounter (HOSPITAL_COMMUNITY): Payer: Self-pay | Admitting: Emergency Medicine

## 2021-12-24 ENCOUNTER — Emergency Department (HOSPITAL_COMMUNITY)
Admission: EM | Admit: 2021-12-24 | Discharge: 2021-12-24 | Disposition: A | Discharge status: Skilled Nursing Facility | Payer: Medicare HMO | Attending: Emergency Medicine | Admitting: Emergency Medicine

## 2021-12-24 ENCOUNTER — Encounter: Payer: Self-pay | Admitting: Adult Health

## 2021-12-24 ENCOUNTER — Emergency Department (HOSPITAL_COMMUNITY): Payer: Medicare HMO

## 2021-12-24 DIAGNOSIS — Z7982 Long term (current) use of aspirin: Secondary | ICD-10-CM | POA: Diagnosis not present

## 2021-12-24 DIAGNOSIS — R1310 Dysphagia, unspecified: Secondary | ICD-10-CM | POA: Diagnosis present

## 2021-12-24 DIAGNOSIS — K209 Esophagitis, unspecified without bleeding: Secondary | ICD-10-CM | POA: Insufficient documentation

## 2021-12-24 LAB — CBC WITH DIFFERENTIAL/PLATELET
Abs Immature Granulocytes: 0.01 10*3/uL (ref 0.00–0.07)
Basophils Absolute: 0.1 10*3/uL (ref 0.0–0.1)
Basophils Relative: 1 %
Eosinophils Absolute: 0.3 10*3/uL (ref 0.0–0.5)
Eosinophils Relative: 3 %
HCT: 43.7 % (ref 39.0–52.0)
Hemoglobin: 15 g/dL (ref 13.0–17.0)
Immature Granulocytes: 0 %
Lymphocytes Relative: 42 %
Lymphs Abs: 4.6 10*3/uL — ABNORMAL HIGH (ref 0.7–4.0)
MCH: 32.5 pg (ref 26.0–34.0)
MCHC: 34.3 g/dL (ref 30.0–36.0)
MCV: 94.6 fL (ref 80.0–100.0)
Monocytes Absolute: 0.5 10*3/uL (ref 0.1–1.0)
Monocytes Relative: 5 %
Neutro Abs: 5.5 10*3/uL (ref 1.7–7.7)
Neutrophils Relative %: 49 %
Platelets: 307 10*3/uL (ref 150–400)
RBC: 4.62 MIL/uL (ref 4.22–5.81)
RDW: 13.4 % (ref 11.5–15.5)
WBC: 10.9 10*3/uL — ABNORMAL HIGH (ref 4.0–10.5)
nRBC: 0 % (ref 0.0–0.2)

## 2021-12-24 LAB — COMPREHENSIVE METABOLIC PANEL
ALT: 40 U/L (ref 0–44)
AST: 29 U/L (ref 15–41)
Albumin: 4 g/dL (ref 3.5–5.0)
Alkaline Phosphatase: 93 U/L (ref 38–126)
Anion gap: 10 (ref 5–15)
BUN: 20 mg/dL (ref 8–23)
CO2: 23 mmol/L (ref 22–32)
Calcium: 9.2 mg/dL (ref 8.9–10.3)
Chloride: 102 mmol/L (ref 98–111)
Creatinine, Ser: 1.2 mg/dL (ref 0.61–1.24)
GFR, Estimated: >60 mL/min (ref >60)
Glucose, Bld: 135 mg/dL — ABNORMAL HIGH (ref 70–99)
Potassium: 4.1 mmol/L (ref 3.5–5.1)
Sodium: 135 mmol/L (ref 135–145)
Total Bilirubin: 0.8 mg/dL (ref 0.3–1.2)
Total Protein: 7.9 g/dL (ref 6.5–8.1)

## 2021-12-24 LAB — TROPONIN I (HIGH SENSITIVITY): Troponin I (High Sensitivity): 7 ng/L (ref <18)

## 2021-12-24 LAB — LIPASE, BLOOD: Lipase: 36 U/L (ref 11–51)

## 2021-12-24 MED ORDER — ALUM & MAG HYDROXIDE-SIMETH 200-200-20 MG/5ML PO SUSP
30.0000 mL | Freq: Once | ORAL | Status: AC
  Administered 2021-12-24: 30 mL via ORAL
  Filled 2021-12-24: qty 30

## 2021-12-24 MED ORDER — PANTOPRAZOLE SODIUM 40 MG PO PACK: 40.0000 mg | PACK | Freq: Every day | ORAL | 0 refills | Status: DC

## 2021-12-24 MED ORDER — ALUM & MAG HYDROXIDE-SIMETH 400-400-40 MG/5ML PO SUSP: 15.0000 mL | Freq: Four times a day (QID) | ORAL | 0 refills | Status: DC | PRN

## 2021-12-24 MED ORDER — LIDOCAINE VISCOUS HCL 2 % MT SOLN
15.0000 mL | Freq: Once | OROMUCOSAL | Status: AC
  Administered 2021-12-24: 15 mL via ORAL
  Filled 2021-12-24: qty 15

## 2021-12-24 NOTE — ED Provider Notes (Signed)
[facility] EMERGENCY DEPARTMENT Provider Note   CSN: [id number] Arrival date & time: 12/24/21  0148     History  Chief Complaint  Patient presents with   Dysphagia    Terry Mayo is a 66 y.o. male.  Patient recently had a NSTEMI and a stroke.  Is at the [facility].  Had a episode tonight where he had a feeling of a lump in his chest that felt like he was having trouble swallowing.  Described as pressure.  Associated with some nausea.  No fevers.  Apparently patient was diaphoretic and his blood pressure was low during this time.  He was sent over here for further evaluation.  Patient states he feels a little bit better now.  Still is afraid to swallow as he feels like he might throw up because he thinks that there was something wrong.  He is on nectar liquid at the facility.        Home Medications Prior to Admission medications   Medication Sig Start Date End Date Taking? Authorizing Provider  alum & mag hydroxide-simeth (MAALOX PLUS) 400-400-40 MG/5ML suspension Take 15 mLs by mouth every 6 (six) hours as needed for indigestion (pain with swallowing). 12/24/21  Yes Rodgerick Gilliand, Barbara Cower, MD  pantoprazole sodium (PROTONIX) 40 mg Take 40 mg by mouth daily for 7 days. 12/24/21 12/31/21 Yes Thatiana Renbarger, Barbara Cower, MD  acetaminophen (TYLENOL) 650 MG CR tablet Take 650 mg by mouth every 8 (eight) hours as needed for pain.    [provider]  aspirin EC 81 MG tablet Take 1 tablet (81 mg total) by mouth daily. Swallow whole. 12/07/21   Cyndi Bender, NP  atorvastatin (LIPITOR) 40 MG tablet Take 40 mg by mouth daily. 11/09/21   [provider]  clopidogrel (PLAVIX) 75 MG tablet Take 1 tablet (75 mg total) by mouth daily with breakfast. 12/07/21   Cyndi Bender, NP  isosorbide dinitrate (ISORDIL) 20 MG tablet Take 20 mg by mouth 2 (two) times daily.    [provider]  losartan (COZAAR) 25 MG tablet Take 25 mg by mouth daily. 11/25/21   [provider]  metoprolol tartrate  (LOPRESSOR) 25 MG tablet Take 6.25 mg by mouth 2 (two) times daily.    [provider]  nitroGLYCERIN (NITROSTAT) 0.4 MG SL tablet Place 1 tablet (0.4 mg total) under the tongue every 5 (five) minutes x 3 doses as needed for chest pain. 12/06/21   Cyndi Bender, NP  spironolactone (ALDACTONE) 25 MG tablet Take 0.5 tablets (12.5 mg total) by mouth daily. 12/07/21   Cyndi Bender, NP      Allergies    Penicillins    Review of Systems   Review of Systems  Physical Exam Updated Vital Signs BP 132/89   Pulse 62   Temp 97.7 F (36.5 C) (Oral)   Resp 17   Ht 6\' 2"  (1.88 m)   Wt 84.5 kg   SpO2 100%   BMI 23.92 kg/m  Physical Exam Vitals and nursing note reviewed.  Constitutional:      Appearance: He is well-developed.  HENT:     Head: Normocephalic and atraumatic.  Cardiovascular:     Rate and Rhythm: Normal rate.  Pulmonary:     Effort: Pulmonary effort is normal. No respiratory distress.  Abdominal:     General: There is no distension.  Musculoskeletal:        General: Normal range of motion.     Cervical back: Normal range of motion.  Neurological:  Mental Status: He is alert. Mental status is at baseline.     ED Results / Procedures / Treatments   Labs (all labs ordered are listed, but only abnormal results are displayed) Labs Reviewed  CBC WITH DIFFERENTIAL/PLATELET - Abnormal; Notable for the following components:      Result Value   WBC 10.9 (*)    Lymphs Abs 4.6 (*)    All other components within normal limits  COMPREHENSIVE METABOLIC PANEL - Abnormal; Notable for the following components:   Glucose, Bld 135 (*)    All other components within normal limits  LIPASE, BLOOD  TROPONIN I (HIGH SENSITIVITY)    EKG EKG Interpretation  Date/Time:  Wednesday December 24 2021 02:07:07 EDT Ventricular Rate:  73 PR Interval:  174 QRS Duration: 181 QT Interval:  435 QTC Calculation: 480 R Axis:   34 Text Interpretation: Sinus rhythm Nonspecific  intraventricular conduction delay Confirmed by Marily Memos [phone]) on 12/24/2021 2:33:20 AM  Radiology DG Abdomen Acute W/Chest  Result Date: 12/24/2021 CLINICAL DATA:  Difficulty swallowing EXAM: DG ABDOMEN ACUTE WITH 1 VIEW CHEST COMPARISON:  12/11/2021 FINDINGS: Cardiac shadow is stable. Loop recorder is noted. Lungs are well aerated bilaterally. No focal infiltrate or sizable effusion is seen. Scattered large and small bowel gas is noted. No obstructive changes are seen. Gallbladder has been surgically removed. Multiple left renal calculi are noted relatively stable from prior exam of 2009. No ureteral stones are seen. No bony abnormality is noted. IMPRESSION: Multiple left renal calculi. No other focal abnormality is noted. Electronically Signed   By: Alcide Clever M.D.   On: 12/24/2021 02:53    Procedures Procedures    Medications Ordered in ED Medications  alum & mag hydroxide-simeth (MAALOX/MYLANTA) 200-200-20 MG/5ML suspension 30 mL (30 mLs Oral Given 12/24/21 0223)    And  lidocaine (XYLOCAINE) 2 % viscous mouth solution 15 mL (15 mLs Oral Given 12/24/21 0223)    ED Course/ Medical Decision Making/ A&P                           Medical Decision Making Amount and/or Complexity of Data Reviewed Labs: ordered. Radiology: ordered. ECG/medicine tests: ordered.  Risk OTC drugs. Prescription drug management.   Vitals stable here.  Patient does have significant improvement with viscous lidocaine and Maalox.  We will go and send home on Maalox and Protonix for likely esophagitis.  Did check an EKG and troponin to ensure this was not cardiac and these were reassuring.  Also check acute abdominal series to make sure there was no evidence of esophageal perforation or other pathology.  Patient tolerating p.o. and stable for discharge back to the facility.  Final Clinical Impression(s) / ED Diagnoses Final diagnoses:  Acute esophagitis    Rx / DC Orders ED Discharge Orders           Ordered    alum & mag hydroxide-simeth (MAALOX PLUS) 400-400-40 MG/5ML suspension  Every 6 hours PRN        12/24/21 0402    pantoprazole sodium (PROTONIX) 40 mg  Daily        12/24/21 0402              Analese Sovine, Barbara Cower, MD 12/24/21 [phone]

## 2021-12-24 NOTE — ED Triage Notes (Signed)
Pt states when he swallows it feels like he cannot get it down. Per nursing facility pt's blood pressure was 70s systolic

## 2021-12-24 NOTE — Progress Notes (Signed)
Location:  [facility] Room Number: 143 Place of Service:  SNF (31)   CODE STATUS: full   Allergies  Allergen Reactions   Penicillins     REACTION UNKNOWN Has patient had a PCN reaction causing immediate rash, facial/tongue/throat swelling, SOB or lightheadedness with hypotension: UNKNOWN Has patient had a PCN reaction causing severe rash involving mucus membranes or skin necrosis: UNKNOWN Has patient had a PCN reaction that required hospitalization UNKNOWN Has patient had a PCN reaction occurring within the last 10 years: UNKNOWN If all of the above answers are "NO", then may proceed with Cephalosporin use.     Chief Complaint  Patient presents with   Acute Visit    Follow up ED     HPI:  He went to the ED over this past weekend due to difficulty swallowing. He complained that he could not get his food down easily and had some pain present. He was found to have esophagitis and was started on protonix. He has returned back to the facility.   Past Medical History:  Diagnosis Date   Arthritis    Essential hypertension    Hyperlipidemia    Kidney stones     Past Surgical History:  Procedure Laterality Date   CHOLECYSTECTOMY  1990s   COLONOSCOPY WITH PROPOFOL N/A 09/30/2015   Procedure: COLONOSCOPY WITH PROPOFOL;  Surgeon: Corbin Ade, MD;  [facility];  Service: Endoscopy;  Laterality: N/A;  930   IR CT HEAD LTD  12/10/2021   IR PERCUTANEOUS ART THROMBECTOMY/INFUSION INTRACRANIAL INC DIAG ANGIO  12/10/2021   IR US GUIDE VASC ACCESS RIGHT  12/10/2021   KIDNEY STONE SURGERY  2005   LEFT HEART CATH AND CORONARY ANGIOGRAPHY N/A 12/04/2021   Procedure: LEFT HEART CATH AND CORONARY ANGIOGRAPHY;  Surgeon: Lyn Records, MD;  [facility]: [facility] INVASIVE CV LAB;  Service: Cardiovascular;  Laterality: N/A;   LOOP RECORDER INSERTION N/A 12/16/2021   Procedure: LOOP RECORDER INSERTION;  Surgeon: Regan Lemming, MD;  Location: [facility] CV LAB;   Service: Cardiovascular;  Laterality: N/A;   RADIOLOGY WITH ANESTHESIA N/A 12/10/2021   Procedure: IR WITH ANESTHESIA;  Surgeon: Julieanne Cotton, MD;  Location: [facility] OR;  Service: Radiology;  Laterality: N/A;    Social History   Socioeconomic History   Marital status: Single    Spouse name: Not on file   Number of children: Not on file   Years of education: Not on file   Highest education level: Not on file  Occupational History   Not on file  Tobacco Use   Smoking status: Former    Types: Cigarettes    Quit date: 08/05/2015    Years since quitting: 6.3   Smokeless tobacco: Never  Vaping Use   Vaping Use: Never used  Substance and Sexual Activity   Alcohol use: Not Currently    Alcohol/week: 0.0 standard drinks of alcohol    Comment: No ETOH in a month. Variable, up to daily, as much as much as a fifth of liquor per sitting.   Drug use: No    Frequency: 7.0 times per week    Comment: As often as daily when he does smoke marijuana   Sexual activity: Yes    Birth control/protection: None  Other Topics Concern   Not on file  Social History Narrative   Not on file   Social Determinants of Health   Financial Resource Strain: Not on file  Food Insecurity: Not on file  Transportation Needs: Not on file  Physical Activity: Not on file  Stress: Not on file  Social Connections: Not on file  Intimate Partner Violence: Not on file   Family History  Problem Relation Age of Onset   Coronary artery disease Sister    Colon cancer Neg Hx       VITAL SIGNS BP (!) 100/52   Pulse 67   Temp (!) 97.2 F (36.2 C)   Resp 20   Ht 6\' 2"  (1.88 m)   Wt 181 lb 9.6 oz (82.4 kg)   SpO2 100%   BMI 23.32 kg/m   Outpatient Encounter Medications as of 12/24/2021  Medication Sig   acetaminophen (TYLENOL) 650 MG CR tablet Take 650 mg by mouth every 8 (eight) hours as needed for pain.   alum & mag hydroxide-simeth (MAALOX PLUS) 400-400-40 MG/5ML suspension Take 15 mLs by mouth every 6  (six) hours as needed for indigestion (pain with swallowing).   aspirin EC 81 MG tablet Take 1 tablet (81 mg total) by mouth daily. Swallow whole.   atorvastatin (LIPITOR) 40 MG tablet Take 40 mg by mouth daily.   clopidogrel (PLAVIX) 75 MG tablet Take 1 tablet (75 mg total) by mouth daily with breakfast.   isosorbide dinitrate (ISORDIL) 20 MG tablet Take 20 mg by mouth 2 (two) times daily.   losartan (COZAAR) 25 MG tablet Take 25 mg by mouth daily.   metoprolol tartrate (LOPRESSOR) 25 MG tablet Take 6.25 mg by mouth 2 (two) times daily.   nitroGLYCERIN (NITROSTAT) 0.4 MG SL tablet Place 1 tablet (0.4 mg total) under the tongue every 5 (five) minutes x 3 doses as needed for chest pain.   pantoprazole sodium (PROTONIX) 40 mg Take 40 mg by mouth daily for 7 days.   spironolactone (ALDACTONE) 25 MG tablet Take 0.5 tablets (12.5 mg total) by mouth daily.   No facility-administered encounter medications on file as of 12/24/2021.     SIGNIFICANT DIAGNOSTIC EXAMS  PREVIOUS   12-09-21: ct head:  1. No acute intracranial process. 2. ASPECTS is 10  12-09-21: ct angio of head:  1. Occlusion of the distal left ICA (series 6, image 97), with no definite opacification of the left MCA and its branches. 2. Infarct core in the left MCA territory measures 69 mL, with penumbra measuring 359 mL with a mismatch volume of 290 mL. 3. No other hemodynamically significant stenosis in the head or neck.  12-11-21: MRI head:  Large area of acute/subacute ischemia within the left MCA territory. No hemorrhage or mass effect  NO NEW EXAMS   LABS REVIEWED:   12-05-21: hgb a1c 5.5; chol 99; ldl 41; trig 41; hdl 50  12-09-21: wbc 11.1; hgb 15.8; hct 45.0; mvc 93.2 pt 196 glucose 124; bun 20; creat 1.21; k+ 3.7; na++ 138; ca 8.7; gfr >60 protein 7.7; albumin 4.0; ast 63; alt 85 12-12-21: wbc 14.4; hgb 13.8; hct 39.5; mcv 92.6  plt 170; glucose 117; bun 19; creat 1.22; k+ 3.9; na++ 139; ca 8.3; gfr >60 12-13-21: protein  6.2; albumin 3.0; alt 65 12-15-21; wbc 11.1; hgb 14.0; hct 39.6; mcv 92.1 plt 191; glucose 109; bun 17 creat 1.13; k+ 4.0; na++ 138; ca 8.5; gfr >60   TODAY  12-24-21: wbc 10.9; hgb 15.0; hct 43.7; mcv 94.7 plt 307; glucose 135; bun 20; creat 1.20 ;k+ 4.1; na++ 135; ca 9.2; gfr >60; protein 7.9; albumin 4.0   Review of Systems  Constitutional:  Negative for malaise/fatigue.  Respiratory:  Negative for cough and shortness of breath.   Cardiovascular:  Negative for chest pain, palpitations and leg swelling.  Gastrointestinal:  Negative for abdominal pain, constipation and heartburn.  Musculoskeletal:  Negative for back pain, joint pain and myalgias.  Skin: Negative.   Neurological:  Negative for dizziness.  Psychiatric/Behavioral:  The patient is not nervous/anxious.    Physical Exam Constitutional:      General: He is not in acute distress.    Appearance: He is well-developed. He is not diaphoretic.  Neck:     Thyroid: No thyromegaly.  Cardiovascular:     Rate and Rhythm: Normal rate and regular rhythm.     Pulses: Normal pulses.     Heart sounds: Normal heart sounds.  Pulmonary:     Effort: Pulmonary effort is normal. No respiratory distress.     Breath sounds: Normal breath sounds.  Abdominal:     General: Bowel sounds are normal. There is no distension.     Palpations: Abdomen is soft.     Tenderness: There is no abdominal tenderness.  Musculoskeletal:     Cervical back: Neck supple.     Right lower leg: No edema.     Left lower leg: No edema.     Comments:  Unable to move right upper extremity Is able to move other extremities Is ambulating with a walker with arm rest with therapy    Lymphadenopathy:     Cervical: No cervical adenopathy.  Skin:    General: Skin is warm and dry.  Neurological:     Mental Status: He is alert. Mental status is at baseline.     Comments:  Has facial droop; has difficulty finding some words    Psychiatric:        Mood and Affect: Mood  normal.       ASSESSMENT/ PLAN:  TODAY  Esophagitis: will continue protonix 40 mg daily    Synthia Innocent NP [facility]   call [phone]

## 2021-12-24 NOTE — ED Notes (Signed)
Pt able to shallow oral medication with little trouble at all. Pt states that he feels like his swallowing has improved from earlier

## 2021-12-26 DIAGNOSIS — K209 Esophagitis, unspecified without bleeding: Secondary | ICD-10-CM | POA: Insufficient documentation

## 2022-01-01 ENCOUNTER — Encounter: Payer: Self-pay | Admitting: Adult Health

## 2022-01-01 ENCOUNTER — Other Ambulatory Visit (HOSPITAL_COMMUNITY): Payer: Self-pay

## 2022-01-01 ENCOUNTER — Non-Acute Institutional Stay (SKILLED_NURSING_FACILITY): Payer: Medicare HMO | Admitting: Adult Health

## 2022-01-01 ENCOUNTER — Other Ambulatory Visit: Payer: Self-pay | Admitting: Adult Health

## 2022-01-01 DIAGNOSIS — Z8673 Personal history of transient ischemic attack (TIA), and cerebral infarction without residual deficits: Secondary | ICD-10-CM

## 2022-01-01 DIAGNOSIS — I4729 Other ventricular tachycardia: Secondary | ICD-10-CM | POA: Diagnosis not present

## 2022-01-01 DIAGNOSIS — G8191 Hemiplegia, unspecified affecting right dominant side: Secondary | ICD-10-CM | POA: Diagnosis not present

## 2022-01-01 DIAGNOSIS — R1310 Dysphagia, unspecified: Secondary | ICD-10-CM

## 2022-01-01 MED ORDER — PANTOPRAZOLE SODIUM 40 MG PO PACK: 40.0000 mg | PACK | Freq: Every day | ORAL | 0 refills | Status: AC

## 2022-01-01 MED ORDER — SPIRONOLACTONE 25 MG PO TABS: 12.5000 mg | ORAL_TABLET | Freq: Every day | ORAL | 0 refills | Status: AC

## 2022-01-01 MED ORDER — LOSARTAN POTASSIUM 25 MG PO TABS: 25.0000 mg | ORAL_TABLET | Freq: Every day | ORAL | 0 refills | Status: AC

## 2022-01-01 MED ORDER — NITROGLYCERIN 0.4 MG SL SUBL: 0.4000 mg | SUBLINGUAL_TABLET | SUBLINGUAL | 0 refills | Status: AC | PRN

## 2022-01-01 MED ORDER — BUSPIRONE HCL 7.5 MG PO TABS: 7.5000 mg | ORAL_TABLET | Freq: Two times a day (BID) | ORAL | 0 refills | Status: AC

## 2022-01-01 MED ORDER — METOPROLOL TARTRATE 25 MG PO TABS: 12.5000 mg | ORAL_TABLET | Freq: Two times a day (BID) | ORAL | 0 refills | Status: AC

## 2022-01-01 MED ORDER — ISOSORBIDE DINITRATE 20 MG PO TABS: 20.0000 mg | ORAL_TABLET | Freq: Two times a day (BID) | ORAL | 0 refills | Status: AC

## 2022-01-01 MED ORDER — CLOPIDOGREL BISULFATE 75 MG PO TABS: 75.0000 mg | ORAL_TABLET | Freq: Every day | ORAL | 0 refills | Status: AC

## 2022-01-01 MED ORDER — ATORVASTATIN CALCIUM 40 MG PO TABS
40.0000 mg | ORAL_TABLET | Freq: Every day | ORAL | 0 refills | Status: DC
Start: 2022-01-01 — End: 2022-02-10

## 2022-01-01 NOTE — Progress Notes (Signed)
Location:  [facility] Room Number: 143 Place of Service:  SNF (31)  Provider: Synthia Innocent np   PCP: [facility] Patient Care Team: [facility] as PCP - General Rourk, Gerrit Friends, MD as Consulting Physician (Gastroenterology)  Extended Emergency Contact Information Primary Emergency Contact: Siddle,Drucilla Address: [address]          [place], [place] [address] [place] Home Phone: [phone] Relation: Friend Secondary Emergency Contact: Ignasiak,Annie Address: [address]           [phone] [place] Home Phone: [phone] Relation: Mother  Code Status: full  Goals of care:  Advanced Directive information    12/24/2021    2:04 AM  Advanced Directives  Does Patient Have a Medical Advance Directive? No  Would patient like information on creating a medical advance directive? No - Patient declined     Allergies  Allergen Reactions   Penicillins     REACTION UNKNOWN Has patient had a PCN reaction causing immediate rash, facial/tongue/throat swelling, SOB or lightheadedness with hypotension: UNKNOWN Has patient had a PCN reaction causing severe rash involving mucus membranes or skin necrosis: UNKNOWN Has patient had a PCN reaction that required hospitalization UNKNOWN Has patient had a PCN reaction occurring within the last 10 years: UNKNOWN If all of the above answers are "NO", then may proceed with Cephalosporin use.     Chief Complaint  Patient presents with   Discharge Note    HPI:  66 y.o. male  being discharged to home with home health for pt/ot/st. He will not need any dme. He will need his prescriptions written and will need to follow up with his medical provider. 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 He was admitted to this facility to continue with his rehab.  He has participated in pt/ot/st. He is on a pureed diet with nectar thick liquids. He able to ambulate independently. He is ready to continue his therapy on a home health basis.     Past Medical History:  Diagnosis Date   Arthritis    Essential hypertension    Hyperlipidemia    Kidney stones     Past Surgical History:  Procedure Laterality Date   CHOLECYSTECTOMY  1990s   COLONOSCOPY WITH PROPOFOL N/A 09/30/2015   Procedure: COLONOSCOPY WITH PROPOFOL;  Surgeon: Corbin Ade, MD;  [facility];  Service: Endoscopy;  Laterality: N/A;  930   IR CT HEAD LTD  12/10/2021   IR PERCUTANEOUS ART THROMBECTOMY/INFUSION INTRACRANIAL INC DIAG ANGIO  12/10/2021   IR US GUIDE VASC ACCESS RIGHT  12/10/2021   KIDNEY STONE SURGERY  2005   LEFT HEART CATH AND CORONARY ANGIOGRAPHY N/A 12/04/2021   Procedure: LEFT HEART CATH AND CORONARY ANGIOGRAPHY;  Surgeon: Lyn Records, MD;  [facility]: [facility];  Service: Cardiovascular;  Laterality: N/A;   LOOP RECORDER INSERTION N/A 12/16/2021   Procedure: LOOP RECORDER INSERTION;  Surgeon: Regan Lemming, MD;  Location: [facility];  Service: Cardiovascular;  Laterality: N/A;   RADIOLOGY WITH ANESTHESIA N/A 12/10/2021   Procedure: IR WITH ANESTHESIA;  Surgeon: Julieanne Cotton, MD;  Location: [facility] OR;  Service: Radiology;  Laterality: N/A;      reports that he quit smoking about 6 years ago. His smoking use included cigarettes. He has never used  smokeless tobacco. He reports that he does not currently use alcohol. He reports that he does not use drugs. Social History   Socioeconomic History   Marital status: Single    Spouse name: Not on file   Number of children: Not on file   Years of education: Not on file   Highest education level: Not on file  Occupational History   Not on file  Tobacco Use   Smoking status: Former    Types: Cigarettes    Quit date: 08/05/2015    Years since quitting: 6.4   Smokeless tobacco: Never   Vaping Use   Vaping Use: Never used  Substance and Sexual Activity   Alcohol use: Not Currently    Alcohol/week: 0.0 standard drinks of alcohol    Comment: No ETOH in a month. Variable, up to daily, as much as much as a fifth of liquor per sitting.   Drug use: No    Frequency: 7.0 times per week    Comment: As often as daily when he does smoke marijuana   Sexual activity: Yes    Birth control/protection: None  Other Topics Concern   Not on file  Social History Narrative   Not on file   Social Determinants of Health   Financial Resource Strain: Not on file  Food Insecurity: Not on file  Transportation Needs: Not on file  Physical Activity: Not on file  Stress: Not on file  Social Connections: Not on file  Intimate Partner Violence: Not on file   Functional Status Survey:    Allergies  Allergen Reactions   Penicillins     REACTION UNKNOWN Has patient had a PCN reaction causing immediate rash, facial/tongue/throat swelling, SOB or lightheadedness with hypotension: UNKNOWN Has patient had a PCN reaction causing severe rash involving mucus membranes or skin necrosis: UNKNOWN Has patient had a PCN reaction that required hospitalization UNKNOWN Has patient had a PCN reaction occurring within the last 10 years: UNKNOWN If all of the above answers are "NO", then may proceed with Cephalosporin use.     Pertinent  Health Maintenance Due  Topic Date Due   INFLUENZA VACCINE  Never done   COLONOSCOPY (Pts 45-32yrs Insurance coverage will need to be confirmed)  09/29/2025    Medications: Outpatient Encounter Medications as of 01/01/2022  Medication Sig   busPIRone (BUSPAR) 7.5 MG tablet Take 1 tablet (7.5 mg total) by mouth 2 (two) times daily.   acetaminophen (TYLENOL) 650 MG CR tablet Take 650 mg by mouth every 8 (eight) hours as needed for pain.   alum & mag hydroxide-simeth (MAALOX PLUS) 400-400-40 MG/5ML suspension Take 15 mLs by mouth every 6 (six) hours as needed for  indigestion (pain with swallowing).   aspirin EC 81 MG tablet Take 1 tablet (81 mg total) by mouth daily. Swallow whole.   atorvastatin (LIPITOR) 40 MG tablet Take 1 tablet (40 mg total) by mouth daily.   clopidogrel (PLAVIX) 75 MG tablet Take 1 tablet (75 mg total) by mouth daily with breakfast.   isosorbide dinitrate (ISORDIL) 20 MG tablet Take 1 tablet (20 mg total) by mouth 2 (two) times daily.   losartan (COZAAR) 25 MG tablet Take 1 tablet (25 mg total) by mouth daily.   metoprolol tartrate (LOPRESSOR) 25 MG tablet Take 0.5 tablets (12.5 mg total) by mouth 2 (two) times daily.   nitroGLYCERIN (NITROSTAT) 0.4 MG SL tablet Place 1 tablet (0.4 mg total) under the tongue every 5 (five) minutes x 3 doses as needed  for chest pain.   pantoprazole sodium (PROTONIX) 40 mg Take 40 mg by mouth daily.   spironolactone (ALDACTONE) 25 MG tablet Take 0.5 tablets (12.5 mg total) by mouth daily.   No facility-administered encounter medications on file as of 01/01/2022.    Vitals:   01/01/22 1055  BP: 100/61  Pulse: 67  Resp: 18  Temp: 97.6 F (36.4 C)  SpO2: 99%  Weight: 181 lb 6.4 oz (82.3 kg)  Height: 6\' 2"  (1.88 m)   Body mass index is 23.29 kg/m.   SIGNIFICANT DIAGNOSTIC EXAMS  PREVIOUS   12-09-21: ct head:  1. No acute intracranial process. 2. ASPECTS is 10  12-09-21: ct angio of head:  1. Occlusion of the distal left ICA (series 6, image 97), with no definite opacification of the left MCA and its branches. 2. Infarct core in the left MCA territory measures 69 mL, with penumbra measuring 359 mL with a mismatch volume of 290 mL. 3. No other hemodynamically significant stenosis in the head or neck.  12-11-21: MRI head:  Large area of acute/subacute ischemia within the left MCA territory. No hemorrhage or mass effect  NO NEW EXAMS   LABS REVIEWED:   12-05-21: hgb a1c 5.5; chol 99; ldl 41; trig 41; hdl 50  12-09-21: wbc 11.1; hgb 15.8; hct 45.0; mvc 93.2 pt 196 glucose 124; bun 20;  creat 1.21; k+ 3.7; na++ 138; ca 8.7; gfr >60 protein 7.7; albumin 4.0; ast 63; alt 85 12-12-21: wbc 14.4; hgb 13.8; hct 39.5; mcv 92.6  plt 170; glucose 117; bun 19; creat 1.22; k+ 3.9; na++ 139; ca 8.3; gfr >60 12-13-21: protein 6.2; albumin 3.0; alt 65 12-15-21; wbc 11.1; hgb 14.0; hct 39.6; mcv 92.1 plt 191; glucose 109; bun 17 creat 1.13; k+ 4.0; na++ 138; ca 8.5; gfr >60  12-24-21: wbc 10.9; hgb 15.0; hct 43.7; mcv 94.7 plt 307; glucose 135; bun 20; creat 1.20 ;k+ 4.1; na++ 135; ca 9.2; gfr >60; protein 7.9; albumin 4.0   NO NEW LABS.   Review of Systems  Constitutional:  Negative for malaise/fatigue.  Respiratory:  Negative for cough and shortness of breath.   Cardiovascular:  Negative for chest pain, palpitations and leg swelling.  Gastrointestinal:  Negative for abdominal pain, constipation and heartburn.  Musculoskeletal:  Negative for back pain, joint pain and myalgias.  Skin: Negative.   Neurological:  Negative for dizziness.  Psychiatric/Behavioral:  The patient is not nervous/anxious.    Physical Exam Constitutional:      General: He is not in acute distress.    Appearance: He is well-developed. He is not diaphoretic.  Neck:     Thyroid: No thyromegaly.  Cardiovascular:     Rate and Rhythm: Normal rate and regular rhythm.     Pulses: Normal pulses.     Heart sounds: Normal heart sounds.  Pulmonary:     Effort: Pulmonary effort is normal. No respiratory distress.     Breath sounds: Normal breath sounds.  Abdominal:     General: Bowel sounds are normal. There is no distension.     Palpations: Abdomen is soft.     Tenderness: There is no abdominal tenderness.  Musculoskeletal:     Cervical back: Neck supple.     Right lower leg: No edema.     Left lower leg: No edema.     Comments:  Unable to move right upper extremity Is able to move other extremities     Lymphadenopathy:     Cervical: No cervical  adenopathy.  Skin:    General: Skin is warm and dry.  Neurological:      Mental Status: He is alert and oriented to person, place, and time.  Psychiatric:        Mood and Affect: Mood normal.       Assessment/Plan:    Patient is being discharged with the following home health services:  pt/ot/st to evaluate and treat as indicated for gait balance strength adl training dysphagia.   Patient is being discharged with the following durable medical equipment:  none needed   Patient has been advised to f/u with their PCP in 1-2 weeks to for a transitions of care visit.  Social services at their facility was responsible for arranging this appointment.  Pt was provided with adequate prescriptions of noncontrolled medications to reach the scheduled appointment .  For controlled substances, a limited supply was provided as appropriate for the individual patient.  If the pt normally receives these medications from a pain clinic or has a contract with another physician, these medications should be received from that clinic or physician only).    A 30 day supply of his prescription medications have been sent to [facility] pharmacy in [place].   Time spent with patient: 35 minutes: medications; home health dme   Synthia Innocent NP [facility]   call [phone]

## 2022-01-09 ENCOUNTER — Ambulatory Visit (HOSPITAL_COMMUNITY)
Admission: RE | Admit: 2022-01-09 | Discharge: 2022-01-09 | Disposition: A | Discharge status: Home / Self Care | Payer: Medicare HMO | Source: Ambulatory Visit | Attending: Adult Health | Admitting: Adult Health

## 2022-01-09 ENCOUNTER — Ambulatory Visit (HOSPITAL_COMMUNITY)
Admission: RE | Admit: 2022-01-09 | Discharge: 2022-01-09 | Disposition: A | Discharge status: Home / Self Care | Payer: Medicare HMO | Source: Ambulatory Visit

## 2022-01-09 DIAGNOSIS — R1310 Dysphagia, unspecified: Secondary | ICD-10-CM

## 2022-01-09 DIAGNOSIS — R1312 Dysphagia, oropharyngeal phase: Secondary | ICD-10-CM | POA: Insufficient documentation

## 2022-01-09 NOTE — Progress Notes (Signed)
Modified Barium Swallow Progress Note  Patient Details  Name: Terry Mayo MRN: [id number] Date of Birth: 1955-08-31  Today's Date: 01/09/2022  Modified Barium Swallow completed.  Full report located under Chart Review in the Imaging Section.  Brief recommendations include the following:  Clinical Impression  Pt presents with a mild oropharyngeal dysphagia. His oral phase is still a little slow and he has oral residue more than pharyngeal, but he seems to clear this more spontaneously compared to previous MBS report. Thin liquids reach the pyriform sinuses before the swallow and his timing of initiation does fluctuate with a little delay at times, but he manages to protect his airway well despite this. Other than timing, his pharyngeal phase is relatively functional. He had a few instances of trace penetration with thin liquids that cleared spontaneously (PAS 2) as he continued swallowing, and this actually did not seem to be a result of a delay, but more so too much volume (occurred during large, consecutive sips via cup or straw). Note that pt and wife say that he has been losing weight on current diet. Pt also reports feeling he is not sure how to implement some of the strategies he has been given thus far (like trying to swallow on one side per his report). Recommend advancing diet to small, soft, bite-sized foods and thin liquids. Education provided about results and recommendations, encouraging pt that he is swallowing and just to think about going slow and taking small bites/sips. Pt and wife agreeable and voiced their understanding.   Swallow Evaluation Recommendations       SLP Diet Recommendations: Dysphagia 3 (Mech soft) solids;Thin liquid   Liquid Administration via: Cup;Straw   Medication Administration: Whole meds with liquid   Supervision: Patient able to self feed   Compensations: Slow rate;Small sips/bites;Minimize environmental distractions;Other (Comment) (one sip at a  time)   Postural Changes: Seated upright at 90 degrees;Remain semi-upright after after feeds/meals (Comment)   Oral Care Recommendations: Oral care BID        Mahala Menghini., M.A. CCC-SLP Acute Rehabilitation Services Office [phone]  Secure chat preferred  01/09/2022,12:46 PM

## 2022-01-15 ENCOUNTER — Encounter: Payer: Self-pay | Admitting: Neurology

## 2022-01-15 ENCOUNTER — Ambulatory Visit (INDEPENDENT_AMBULATORY_CARE_PROVIDER_SITE_OTHER): Payer: Medicare HMO | Admitting: Neurology

## 2022-01-15 VITALS — BP 108/67 | HR 53 | Ht 74.0 in | Wt 169.5 lb

## 2022-01-15 DIAGNOSIS — I25118 Atherosclerotic heart disease of native coronary artery with other forms of angina pectoris: Secondary | ICD-10-CM | POA: Diagnosis not present

## 2022-01-15 DIAGNOSIS — G8191 Hemiplegia, unspecified affecting right dominant side: Secondary | ICD-10-CM

## 2022-01-15 DIAGNOSIS — E785 Hyperlipidemia, unspecified: Secondary | ICD-10-CM

## 2022-01-15 DIAGNOSIS — I63232 Cerebral infarction due to unspecified occlusion or stenosis of left carotid arteries: Secondary | ICD-10-CM

## 2022-01-15 DIAGNOSIS — I10 Essential (primary) hypertension: Secondary | ICD-10-CM

## 2022-01-15 NOTE — Patient Instructions (Signed)
Continue working with home health therapy Keep BP , 130/90, LDL < 70, A1C < 7.0 Remain on aspirin and plavix  See you primary care doctor See you back in 4 months with Dr. Leonie Man

## 2022-01-15 NOTE — Progress Notes (Signed)
Patient: Terry Mayo Date of Birth: April 15, 1956  Reason for Visit: Follow up left MCA stroke History from: Patient, wife Primary Neurologist: Saw Dr. Erlinda Hong and Dr. Leonie Man   ASSESSMENT AND PLAN 66 y.o. year old male   83.  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  CAD, NSTEMI, cardiomyopathy -NSTEMI 8/10 -EF 45 to 50% -Discharged on DAPT aspirin 81 mg daily, Plavix 75 mg daily -Close outpatient follow-up with cardiology  3.  Hypertension -Long-term BP goal normotensive -On Cozaar, Imdur, metoprolol  4.  Hyperlipidemia -LDL 41, goal less than 70 -On Lipitor 40  5.  Dysarthria -Continue working with ST -Monitor weight, intake, on solids now  They had questions about his loop recorder.  We discussed this.  He has a follow-up appointment 02/02/22 with cardiology.  He will follow-up in our office in about 4 months to see Dr. Leonie Man.  For any acute stroke symptoms he should go to the ER.  HISTORY OF PRESENT ILLNESS: Today 01/15/22 Terry Mayo is here today for stroke clinic follow-up.  He had an NSTEMI with cath 12/04/2021 no stents were placed, had been doing well. 12/09/21 developed acute onset right-sided weakness and dysarthria went to the ED.  He was given TNK, post IR with TICI3 reperfusion with Dr. Katherina Right Rodrigiuez for left terminus occlusion.  Loop recorder was placed 12/16/21.  He was discharged to the [facility].  12/24/21 went to the ER for trouble swallowing diagnosed with esophagitis.  Worked with therapy PT/OT/ST on a pured diet with nectar thick liquids.  He was able to ambulate independently.  He was discharged home 01/01/22.   Is now home with  his wife, is doing home health PT/ST/OT. Is doing better with swallowing, upgraded to more solids, food comes out of the right mouth, drools. Doesn't have much appetite. Was picky eater before. Walking with a cane now. Limited movement of right arm, not much use of hand. Speech is still slurred, is getting better. Was not established with cardiology, sees Dr. Harl Bowie next month. Is retired. Stopped smoking THC in August.   -CT head was negative -CTA head and neck distal left ICA and left MCA occlusion -Status post IR with TICI3 reperfusion -MRI of the brain showed large left MCA infarct -2D echo 45 to 50% -EP placed a loop recorder prior to discharge -LDL 41 -A1c 5.5 -Aspirin 81 mg daily and Plavix 75 mg daily prior to admission, continue DAPT at discharge, duration per cardiology  HISTORY  Copied 12/10/21 Dr. Curly Shores HPI: Terry Mayo is a 66 y.o. male with a past medical history significant for hypertension, hyperlipidemia, former smoking, BMI 25.67, recent NSTEMI s/p cath 12/04/2021 (medical management).   He was doing well after discharge until he developed acute onset right-sided weakness and dysarthria for which he presented to the ED for evaluation.   He was initially evaluated by telespecialists who administered TNK and then discussed potential thrombectomy with myself.  I obtained consent from the patient's wife via telephone for the procedure reviewing risks and benefits and answering her questions.   She confirms that he had been doing well postdischarge, but had been having headaches for the past couple of weeks (  his typical migraines, but these had been resolved for the last few years).  She had stepped away to use the bathroom, and found him 5 to 10 minutes later with severely slurred speech and right-sided weakness as well as some confusion.  She notes his weight is very variable but denies any other health concerns   LKW: 2150 Per telemetry specialists evaluation TNK given?:   2309 IA performed?:  Yes Premorbid modified rankin scale:      0 - No symptoms  REVIEW OF SYSTEMS: Out of a complete 14 system review of symptoms, the patient complains only of the following symptoms, and all other reviewed systems are negative.  See HPI  ALLERGIES: Allergies  Allergen Reactions   Penicillins     REACTION UNKNOWN Has patient had a PCN reaction causing immediate rash, facial/tongue/throat swelling, SOB or lightheadedness with hypotension: UNKNOWN Has patient had a PCN reaction causing severe rash involving mucus membranes or skin necrosis: UNKNOWN Has patient had a PCN reaction that required hospitalization UNKNOWN Has patient had a PCN reaction occurring within the last 10 years: UNKNOWN If all of the above answers are "NO", then may proceed with Cephalosporin use.     HOME MEDICATIONS: Outpatient Medications Prior to Visit  Medication Sig Dispense Refill   acetaminophen (TYLENOL) 650 MG CR tablet Take 650 mg by mouth every 8 (eight) hours as needed for pain.     alum & mag hydroxide-simeth (MAALOX PLUS) 400-400-40 MG/5ML suspension Take 15 mLs by mouth every 6 (six) hours as needed for indigestion (pain with swallowing). 355 mL 0   aspirin EC 81 MG tablet Take 1 tablet (81 mg total) by mouth daily. Swallow whole. 30 tablet 3   atorvastatin (LIPITOR) 40 MG tablet Take 1 tablet (40 mg total) by mouth daily. 30 tablet 0   busPIRone (BUSPAR) 7.5 MG tablet Take 1 tablet (7.5 mg total) by mouth 2 (two) times daily. 60 tablet 0   clopidogrel (PLAVIX) 75 MG tablet Take 1 tablet (75 mg total) by mouth daily with breakfast. 30 tablet 0   isosorbide dinitrate (ISORDIL) 20 MG tablet Take 1 tablet (20 mg total) by mouth 2 (two) times daily. 60 tablet 0   losartan (COZAAR) 25 MG tablet Take 1 tablet (25 mg total) by mouth daily. 30 tablet 0   metoprolol tartrate (LOPRESSOR) 25 MG tablet Take 0.5 tablets (12.5 mg total) by mouth 2 (two) times daily. 60 tablet 0   nitroGLYCERIN  (NITROSTAT) 0.4 MG SL tablet Place 1 tablet (0.4 mg total) under the tongue every 5 (five) minutes x 3 doses as needed for chest pain. 20 tablet 0   pantoprazole sodium (PROTONIX) 40 mg Take 40 mg by mouth daily. 30 each 0   spironolactone (ALDACTONE) 25 MG tablet Take 0.5 tablets (12.5 mg total) by mouth daily. 15 tablet 0   No facility-administered medications prior to visit.    PAST MEDICAL HISTORY: Past Medical History:  Diagnosis Date   Arthritis    Essential hypertension    Hyperlipidemia    Kidney stones     PAST SURGICAL HISTORY: Past Surgical History:  Procedure Laterality Date   CHOLECYSTECTOMY  1990s   COLONOSCOPY WITH PROPOFOL N/A 09/30/2015   Procedure: COLONOSCOPY WITH PROPOFOL;  Surgeon: Daneil Dolin, MD;  [facility];  Service: Endoscopy;  Laterality: N/A;  930   IR CT HEAD LTD  12/10/2021   IR PERCUTANEOUS ART THROMBECTOMY/INFUSION INTRACRANIAL INC DIAG ANGIO  12/10/2021   IR US GUIDE  VASC ACCESS RIGHT  12/10/2021   KIDNEY STONE SURGERY  2005   LEFT HEART CATH AND CORONARY ANGIOGRAPHY N/A 12/04/2021   Procedure: LEFT HEART CATH AND CORONARY ANGIOGRAPHY;  Surgeon: Belva Crome, MD;  Location: [facility];  Service: Cardiovascular;  Laterality: N/A;   LOOP RECORDER INSERTION N/A 12/16/2021   Procedure: LOOP RECORDER INSERTION;  Surgeon: Constance Haw, MD;  Location: [facility];  Service: Cardiovascular;  Laterality: N/A;   RADIOLOGY WITH ANESTHESIA N/A 12/10/2021   Procedure: IR WITH ANESTHESIA;  Surgeon: Luanne Bras, MD;  Location: [facility];  Service: Radiology;  Laterality: N/A;    FAMILY HISTORY: Family History  Problem Relation Age of Onset   Coronary artery disease Sister    Colon cancer Neg Hx     SOCIAL HISTORY: Social History   Socioeconomic History   Marital status: Single    Spouse name: Not on file   Number of children: Not on file   Years of education: Not on file   Highest education level: Not on file   Occupational History   Not on file  Tobacco Use   Smoking status: Former    Types: Cigarettes    Quit date: 08/05/2015    Years since quitting: 6.4   Smokeless tobacco: Never  Vaping Use   Vaping Use: Never used  Substance and Sexual Activity   Alcohol use: Not Currently    Alcohol/week: 0.0 standard drinks of alcohol    Comment: No ETOH in a month. Variable, up to daily, as much as much as a fifth of liquor per sitting.   Drug use: No    Frequency: 7.0 times per week    Comment: As often as daily when he does smoke marijuana   Sexual activity: Yes    Birth control/protection: None  Other Topics Concern   Not on file  Social History Narrative   Not on file   Social Determinants of Health   Financial Resource Strain: Not on file  Food Insecurity: Not on file  Transportation Needs: Not on file  Physical Activity: Not on file  Stress: Not on file  Social Connections: Not on file  Intimate Partner Violence: Not on file   PHYSICAL EXAM  Vitals:   01/15/22 1018  BP: 108/67  Pulse: (!) 53  Weight: 169 lb 8 oz (76.9 kg)  Height: 6\' 2"  (1.88 m)   Body mass index is 21.76 kg/m.  Generalized: Well developed, in no acute distress  Neurological examination  Mentation: Alert oriented to time, place, history taking. Follows all commands, speech is moderately dysarthric, very pleasant Cranial nerve II-XII: Pupils were equal round reactive to light.  Left gaze preference, does not completely track to the right.  Right-sided facial droop.  Decreased right-sided shoulder shrug. Motor: right arm 2/5, no movement of the right wrist or hand cannot squeeze, left arm is normal 4/5 right leg, left leg is normal Sensory: Sensory testing is intact to soft touch on all 4 extremities. No evidence of extinction is noted.  Coordination: Cannot perform finger-nose-finger with the right, heel to shin both sides is normal Gait and station: Cautious, wide-based, tends to limp on the right, uses  cane Reflexes: Deep tendon reflexes are symmetric but increased on the right slightly  DIAGNOSTIC DATA (LABS, IMAGING, TESTING) - I reviewed patient records, labs, notes, testing and imaging myself where available.  Lab Results  Component Value Date   WBC 10.9 (H) 12/24/2021   HGB 15.0 12/24/2021  HCT 43.7 12/24/2021   MCV 94.6 12/24/2021   PLT 307 12/24/2021      Component Value Date/Time   NA 135 12/24/2021 0211   K 4.1 12/24/2021 0211   CL 102 12/24/2021 0211   CO2 23 12/24/2021 0211   GLUCOSE 135 (H) 12/24/2021 0211   BUN 20 12/24/2021 0211   CREATININE 1.20 12/24/2021 0211   CALCIUM 9.2 12/24/2021 0211   PROT 7.9 12/24/2021 0211   ALBUMIN 4.0 12/24/2021 0211   AST 29 12/24/2021 0211   ALT 40 12/24/2021 0211   ALKPHOS 93 12/24/2021 0211   BILITOT 0.8 12/24/2021 0211   GFRNONAA >60 12/24/2021 0211   GFRAA >60 09/26/2015 0800   Lab Results  Component Value Date   CHOL 99 12/05/2021   HDL 50 12/05/2021   LDLCALC 41 12/05/2021   TRIG 58 12/11/2021   CHOLHDL 2.0 12/05/2021   Lab Results  Component Value Date   HGBA1C 5.5 12/05/2021   No results found for: "VITAMINB12" No results found for: "TSH"  [name], AGNP-C, DNP 01/15/2022, 11:05 AM [facility] [address] [place], [place] [address] [phone]

## 2022-01-19 ENCOUNTER — Ambulatory Visit (HOSPITAL_COMMUNITY): Payer: Medicare HMO | Attending: Emergency Medicine

## 2022-01-19 DIAGNOSIS — I63232 Cerebral infarction due to unspecified occlusion or stenosis of left carotid arteries: Secondary | ICD-10-CM | POA: Diagnosis not present

## 2022-01-20 ENCOUNTER — Ambulatory Visit
Admission: EM | Admit: 2022-01-20 | Discharge: 2022-01-20 | Disposition: A | Discharge status: Home / Self Care | Payer: Medicare HMO | Attending: Nurse Practitioner | Admitting: Nurse Practitioner

## 2022-01-20 DIAGNOSIS — R110 Nausea: Secondary | ICD-10-CM | POA: Diagnosis not present

## 2022-01-20 LAB — CUP PACEART REMOTE DEVICE CHECK
Date Time Interrogation Session: 20230924175303
Implantable Pulse Generator Implant Date: 20230822

## 2022-01-20 MED ORDER — ONDANSETRON 4 MG PO TBDP: 4.0000 mg | ORAL_TABLET | Freq: Three times a day (TID) | ORAL | 0 refills | Status: DC | PRN

## 2022-01-20 MED ORDER — OMEPRAZOLE 20 MG PO CPDR: 20.0000 mg | DELAYED_RELEASE_CAPSULE | Freq: Every day | ORAL | 0 refills | Status: DC

## 2022-01-20 NOTE — Discharge Instructions (Signed)
Take medication as prescribed. Recommend smaller portion sizes to help with swallowing. Sit up at least 2 to 3 hours after eating to prevent nausea and vomiting. Go to the emergency department if there is worsening difficulty swallowing, shortness of breath, difficulty breathing, or vomiting. Follow-up with your primary care physician in 1 week as scheduled. If symptoms fail to improve, please speak with neurology to determine next steps for further evaluation. Follow-up as needed.

## 2022-01-20 NOTE — ED Provider Notes (Signed)
[facility]    CSN: [id number] Arrival date & time: 01/20/22  1700      History   Chief Complaint Chief Complaint  Patient presents with   Nausea   Emesis    HPI Terry Mayo is a 66 y.o. male.   The history is provided by the patient.   The patient presents with his mother for complaints of nausea that has been present for the last week.  Patient states that the nausea comes and goes and does not occur at any specific time.  Patient states that he has not been vomiting, but this has been impacting his appetite.  Patient's mother reports that patient is currently recovering from a heart attack and stroke.  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  She states that the patient recently completed a swallow study and is now on thickened liquids.  Patient also has a history of reflux.  He was seen in the emergency department on 12/24/2021 and diagnosed with esophagitis.  Patient states that he was given Maalox/Mylanta, but did not notice any significant improvement of his symptoms.  Patient and mother deny fever, chills, abdominal pain, vomiting, or diarrhea.  Patient is scheduled to follow-up with his primary care next week.  Patient's mother states patient has several therapist coming to the home for his rehab.    Past Medical History:  Diagnosis Date   Arthritis    Essential hypertension    Hyperlipidemia    Kidney stones     Patient Active Problem List   Diagnosis Date Noted   Esophagitis 12/26/2021   Right hemiplegia ([place]) 12/17/2021   History of ischemic left ICA stroke 12/10/2021   Acute left ICA ischemic stroke ([place]) 12/10/2021   CAD (coronary artery disease) 12/05/2021   Ischemic cardiomyopathy 12/05/2021   NSVT (nonsustained ventricular tachycardia) ([place]) 12/05/2021   Hypertension 12/05/2021   Hyperlipidemia 12/05/2021   NSTEMI (non-ST elevated myocardial infarction) ([place]) 12/04/2021   Colon cancer screening    Loss of weight  09/04/2015   Encounter for screening colonoscopy 09/04/2015    Past Surgical History:  Procedure Laterality Date   CHOLECYSTECTOMY  1990s   COLONOSCOPY WITH PROPOFOL N/A 09/30/2015   Procedure: COLONOSCOPY WITH PROPOFOL;  Surgeon: Daneil Dolin, MD;  [facility];  Service: Endoscopy;  Laterality: N/A;  930   IR CT HEAD LTD  12/10/2021   IR PERCUTANEOUS ART THROMBECTOMY/INFUSION INTRACRANIAL INC DIAG ANGIO  12/10/2021   IR US GUIDE VASC ACCESS RIGHT  12/10/2021   KIDNEY STONE SURGERY  2005   LEFT HEART CATH AND CORONARY ANGIOGRAPHY N/A 12/04/2021   Procedure: LEFT HEART CATH AND CORONARY ANGIOGRAPHY;  Surgeon: Belva Crome, MD;  Location: [facility];  Service: Cardiovascular;  Laterality: N/A;   LOOP RECORDER INSERTION N/A 12/16/2021   Procedure: LOOP RECORDER INSERTION;  Surgeon: Constance Haw, MD;  Location: [facility];  Service: Cardiovascular;  Laterality: N/A;   RADIOLOGY WITH ANESTHESIA N/A 12/10/2021   Procedure: IR WITH ANESTHESIA;  Surgeon: Luanne Bras, MD;  Location: [facility];  Service: Radiology;  Laterality: N/A;       Home Medications    Prior to Admission medications   Medication Sig Start Date End Date Taking? Authorizing Provider  omeprazole (PRILOSEC) 20 MG capsule Take 1 capsule (20 mg total) by mouth daily. 01/20/22  Yes Timothy Townsel-Warren, Alda Lea, NP  ondansetron (ZOFRAN-ODT) 4 MG disintegrating tablet Take 1 tablet (4 mg total)  by mouth every 8 (eight) hours as needed for nausea or vomiting. 01/20/22  Yes Kimisha Eunice-Warren, Alda Lea, NP  acetaminophen (TYLENOL) 650 MG CR tablet Take 650 mg by mouth every 8 (eight) hours as needed for pain.    [provider]  alum & mag hydroxide-simeth (MAALOX PLUS) 400-400-40 MG/5ML suspension Take 15 mLs by mouth every 6 (six) hours as needed for indigestion (pain with swallowing). 12/24/21   Mesner, Corene Cornea, MD  aspirin EC 81 MG tablet Take 1 tablet (81 mg total) by mouth daily. Swallow whole.  12/07/21   Margie Billet, NP  atorvastatin (LIPITOR) 40 MG tablet Take 1 tablet (40 mg total) by mouth daily. 01/01/22   Gerlene Fee, NP  busPIRone (BUSPAR) 7.5 MG tablet Take 1 tablet (7.5 mg total) by mouth 2 (two) times daily. 01/01/22   Gerlene Fee, NP  clopidogrel (PLAVIX) 75 MG tablet Take 1 tablet (75 mg total) by mouth daily with breakfast. 01/01/22   Gerlene Fee, NP  isosorbide dinitrate (ISORDIL) 20 MG tablet Take 1 tablet (20 mg total) by mouth 2 (two) times daily. 01/01/22   Gerlene Fee, NP  losartan (COZAAR) 25 MG tablet Take 1 tablet (25 mg total) by mouth daily. 01/01/22   Gerlene Fee, NP  metoprolol tartrate (LOPRESSOR) 25 MG tablet Take 0.5 tablets (12.5 mg total) by mouth 2 (two) times daily. 01/01/22   Gerlene Fee, NP  nitroGLYCERIN (NITROSTAT) 0.4 MG SL tablet Place 1 tablet (0.4 mg total) under the tongue every 5 (five) minutes x 3 doses as needed for chest pain. 01/01/22   Gerlene Fee, NP  pantoprazole sodium (PROTONIX) 40 mg Take 40 mg by mouth daily. 01/01/22   Gerlene Fee, NP  spironolactone (ALDACTONE) 25 MG tablet Take 0.5 tablets (12.5 mg total) by mouth daily. 01/01/22   Gerlene Fee, NP    Family History Family History  Problem Relation Age of Onset   Coronary artery disease Sister    Colon cancer Neg Hx     Social History Social History   Tobacco Use   Smoking status: Former    Types: Cigarettes    Quit date: 08/05/2015    Years since quitting: 6.4   Smokeless tobacco: Never  Vaping Use   Vaping Use: Never used  Substance Use Topics   Alcohol use: Not Currently    Alcohol/week: 0.0 standard drinks of alcohol    Comment: No ETOH in a month. Variable, up to daily, as much as much as a fifth of liquor per sitting.   Drug use: No    Frequency: 7.0 times per week    Comment: As often as daily when he does smoke marijuana     Allergies   Penicillins   Review of Systems Review of Systems Per HPI  Physical Exam Triage Vital  Signs ED Triage Vitals  Enc Vitals Group     BP 01/20/22 1705 121/78     Pulse Rate 01/20/22 1705 74     Resp 01/20/22 1705 18     Temp 01/20/22 1705 98.4 F (36.9 C)     Temp Source 01/20/22 1705 Oral     SpO2 01/20/22 1705 99 %     Weight --      Height --      Head Circumference --      Peak Flow --      Pain Score 01/20/22 1707 0     Pain Loc --  Pain Edu? --      Excl. in [place]? --    No data found.  Updated Vital Signs BP 121/78 (BP Location: Left Arm)   Pulse 74   Temp 98.4 F (36.9 C) (Oral)   Resp 18   SpO2 99%   Visual Acuity Right Eye Distance:   Left Eye Distance:   Bilateral Distance:    Right Eye Near:   Left Eye Near:    Bilateral Near:     Physical Exam Vitals and nursing note reviewed.  Constitutional:      General: He is not in acute distress.    Appearance: Normal appearance.  HENT:     Mouth/Throat:     Mouth: Mucous membranes are moist.  Eyes:     Extraocular Movements: Extraocular movements intact.     Conjunctiva/sclera: Conjunctivae normal.     Pupils: Pupils are equal, round, and reactive to light.  Cardiovascular:     Rate and Rhythm: Normal rate and regular rhythm.     Pulses: Normal pulses.     Heart sounds: Normal heart sounds.  Pulmonary:     Effort: Pulmonary effort is normal. No respiratory distress.     Breath sounds: Normal breath sounds. No stridor. No wheezing, rhonchi or rales.  Abdominal:     General: Bowel sounds are normal.     Palpations: Abdomen is soft.  Musculoskeletal:     Cervical back: Normal range of motion.  Lymphadenopathy:     Cervical: No cervical adenopathy.  Skin:    General: Skin is warm and dry.     Capillary Refill: Capillary refill takes less than 2 seconds.  Neurological:     Mental Status: He is alert and oriented to person, place, and time.     Comments: Right hemiplegia  Psychiatric:        Mood and Affect: Mood normal.        Behavior: Behavior normal.      UC Treatments /  Results  Labs (all labs ordered are listed, but only abnormal results are displayed) Labs Reviewed - No data to display  EKG   Radiology  Procedures Procedures (including critical care time)  Medications Ordered in UC Medications - No data to display  Initial Impression / Assessment and Plan / UC Course  I have reviewed the triage vital signs and the nursing notes.  Pertinent labs & imaging results that were available during my care of the patient were reviewed by me and considered in my medical decision making (see chart for details).  Presents for complaints of nausea that has been present for the past several days.  On exam, patient's vital signs are stable, he is in no acute distress.  Patient is currently recovering from a NSTEMI and stroke.  Patient does have dysphagia and currently completed a swallow study per his mother's report.  Symptoms could be related to the dysphagia he is currently experiencing.  Discussed the same with the patient's mother and the patient, patient also has a history of esophagitis that was recently diagnosed in the ER on 12/24/2021.  Symptoms could also be related to that condition.  In the interim, will prescribe patient Zofran to help with his nausea and Prilosec for possible reflux symptoms.  Patient's mother was advised to have patient follow-up with his primary care physician in the next week as scheduled.  Supportive care recommendations were provided to the patient's mother.  Patient's mother was given strict indications of when to have the  patient return to the emergency department.  Patient's mother verbalizes understanding.  All questions were answered. Final Clinical Impressions(s) / UC Diagnoses   Final diagnoses:  Nausea without vomiting     Discharge Instructions      Take medication as prescribed. Recommend smaller portion sizes to help with swallowing. Sit up at least 2 to 3 hours after eating to prevent nausea and vomiting. Go to  the emergency department if there is worsening difficulty swallowing, shortness of breath, difficulty breathing, or vomiting. Follow-up with your primary care physician in 1 week as scheduled. If symptoms fail to improve, please speak with neurology to determine next steps for further evaluation. Follow-up as needed.     ED Prescriptions     Medication Sig Dispense Auth. Provider   omeprazole (PRILOSEC) 20 MG capsule Take 1 capsule (20 mg total) by mouth daily. 30 capsule Terry Mayo, Alda Lea, NP   ondansetron (ZOFRAN-ODT) 4 MG disintegrating tablet Take 1 tablet (4 mg total) by mouth every 8 (eight) hours as needed for nausea or vomiting. 20 tablet Sonyia Muro-Warren, Alda Lea, NP      PDMP not reviewed this encounter.   Tish Men, NP 01/20/22 1743

## 2022-01-20 NOTE — ED Triage Notes (Signed)
Pt reports nausea, low appetite x 2-3 days. Pt reports he is recuperating from a stroke in August 2023.

## 2022-01-22 ENCOUNTER — Encounter (HOSPITAL_COMMUNITY): Payer: Self-pay | Admitting: Emergency Medicine

## 2022-01-22 ENCOUNTER — Emergency Department (HOSPITAL_COMMUNITY)
Admission: EM | Admit: 2022-01-22 | Discharge: 2022-01-22 | Disposition: A | Discharge status: Home / Self Care | Payer: Medicare HMO | Attending: Student | Admitting: Student

## 2022-01-22 ENCOUNTER — Emergency Department (HOSPITAL_COMMUNITY): Payer: Medicare HMO

## 2022-01-22 ENCOUNTER — Other Ambulatory Visit: Payer: Self-pay

## 2022-01-22 DIAGNOSIS — Z87891 Personal history of nicotine dependence: Secondary | ICD-10-CM | POA: Diagnosis not present

## 2022-01-22 DIAGNOSIS — Z8673 Personal history of transient ischemic attack (TIA), and cerebral infarction without residual deficits: Secondary | ICD-10-CM | POA: Diagnosis not present

## 2022-01-22 DIAGNOSIS — Z7982 Long term (current) use of aspirin: Secondary | ICD-10-CM | POA: Diagnosis not present

## 2022-01-22 DIAGNOSIS — R109 Unspecified abdominal pain: Secondary | ICD-10-CM | POA: Diagnosis not present

## 2022-01-22 DIAGNOSIS — I10 Essential (primary) hypertension: Secondary | ICD-10-CM | POA: Diagnosis not present

## 2022-01-22 DIAGNOSIS — R112 Nausea with vomiting, unspecified: Secondary | ICD-10-CM | POA: Insufficient documentation

## 2022-01-22 DIAGNOSIS — Z79899 Other long term (current) drug therapy: Secondary | ICD-10-CM | POA: Diagnosis not present

## 2022-01-22 DIAGNOSIS — R110 Nausea: Secondary | ICD-10-CM

## 2022-01-22 DIAGNOSIS — R7401 Elevation of levels of liver transaminase levels: Secondary | ICD-10-CM | POA: Diagnosis not present

## 2022-01-22 DIAGNOSIS — K625 Hemorrhage of anus and rectum: Secondary | ICD-10-CM | POA: Diagnosis not present

## 2022-01-22 DIAGNOSIS — I2510 Atherosclerotic heart disease of native coronary artery without angina pectoris: Secondary | ICD-10-CM | POA: Diagnosis not present

## 2022-01-22 DIAGNOSIS — Z7902 Long term (current) use of antithrombotics/antiplatelets: Secondary | ICD-10-CM | POA: Diagnosis not present

## 2022-01-22 DIAGNOSIS — R1319 Other dysphagia: Secondary | ICD-10-CM

## 2022-01-22 DIAGNOSIS — R630 Anorexia: Secondary | ICD-10-CM | POA: Insufficient documentation

## 2022-01-22 DIAGNOSIS — N12 Tubulo-interstitial nephritis, not specified as acute or chronic: Secondary | ICD-10-CM

## 2022-01-22 LAB — CBC WITH DIFFERENTIAL/PLATELET
Abs Immature Granulocytes: 0.01 10*3/uL (ref 0.00–0.07)
Basophils Absolute: 0 10*3/uL (ref 0.0–0.1)
Basophils Relative: 0 %
Eosinophils Absolute: 0.2 10*3/uL (ref 0.0–0.5)
Eosinophils Relative: 3 %
HCT: 39.4 % (ref 39.0–52.0)
Hemoglobin: 13.6 g/dL (ref 13.0–17.0)
Immature Granulocytes: 0 %
Lymphocytes Relative: 35 %
Lymphs Abs: 2.5 10*3/uL (ref 0.7–4.0)
MCH: 32.3 pg (ref 26.0–34.0)
MCHC: 34.5 g/dL (ref 30.0–36.0)
MCV: 93.6 fL (ref 80.0–100.0)
Monocytes Absolute: 0.4 10*3/uL (ref 0.1–1.0)
Monocytes Relative: 6 %
Neutro Abs: 4 10*3/uL (ref 1.7–7.7)
Neutrophils Relative %: 56 %
Platelets: 202 10*3/uL (ref 150–400)
RBC: 4.21 MIL/uL — ABNORMAL LOW (ref 4.22–5.81)
RDW: 13 % (ref 11.5–15.5)
WBC: 7.2 10*3/uL (ref 4.0–10.5)
nRBC: 0 % (ref 0.0–0.2)

## 2022-01-22 LAB — COMPREHENSIVE METABOLIC PANEL
ALT: 78 U/L — ABNORMAL HIGH (ref 0–44)
AST: 45 U/L — ABNORMAL HIGH (ref 15–41)
Albumin: 3.9 g/dL (ref 3.5–5.0)
Alkaline Phosphatase: 75 U/L (ref 38–126)
Anion gap: 9 (ref 5–15)
BUN: 17 mg/dL (ref 8–23)
CO2: 22 mmol/L (ref 22–32)
Calcium: 8.7 mg/dL — ABNORMAL LOW (ref 8.9–10.3)
Chloride: 102 mmol/L (ref 98–111)
Creatinine, Ser: 1.06 mg/dL (ref 0.61–1.24)
GFR, Estimated: >60 mL/min (ref >60)
Glucose, Bld: 89 mg/dL (ref 70–99)
Potassium: 4.3 mmol/L (ref 3.5–5.1)
Sodium: 133 mmol/L — ABNORMAL LOW (ref 135–145)
Total Bilirubin: 1.2 mg/dL (ref 0.3–1.2)
Total Protein: 7.1 g/dL (ref 6.5–8.1)

## 2022-01-22 LAB — URINALYSIS, ROUTINE W REFLEX MICROSCOPIC
Bilirubin Urine: NEGATIVE
Glucose, UA: NEGATIVE mg/dL
Ketones, ur: 80 mg/dL — AB
Nitrite: NEGATIVE
Protein, ur: NEGATIVE mg/dL
RBC / HPF: >50 RBC/hpf — ABNORMAL HIGH (ref 0–5)
Specific Gravity, Urine: 1.01 (ref 1.005–1.030)
pH: 6 (ref 5.0–8.0)

## 2022-01-22 LAB — LIPASE, BLOOD: Lipase: 36 U/L (ref 11–51)

## 2022-01-22 LAB — POC OCCULT BLOOD, ED: Occult Blood: NEGATIVE

## 2022-01-22 MED ORDER — IOHEXOL 300 MG/ML  SOLN
100.0000 mL | Freq: Once | INTRAMUSCULAR | Status: AC | PRN
  Administered 2022-01-22: 100 mL via INTRAVENOUS

## 2022-01-22 MED ORDER — CEPHALEXIN 500 MG PO CAPS: 500.0000 mg | ORAL_CAPSULE | Freq: Three times a day (TID) | ORAL | 0 refills | Status: DC

## 2022-01-22 MED ORDER — SODIUM CHLORIDE 0.9 % IV SOLN
1.0000 g | Freq: Once | INTRAVENOUS | Status: AC
  Administered 2022-01-22: 1 g via INTRAVENOUS
  Filled 2022-01-22: qty 10

## 2022-01-22 NOTE — ED Notes (Signed)
Discharge instructions reviewed with the patient and family member. The patient did not have any questions or concerns. Patient discharged.

## 2022-01-22 NOTE — ED Provider Notes (Signed)
[facility] EMERGENCY DEPARTMENT Provider Note  CSN: [id number] Arrival date & time: 01/22/22 1040  Chief Complaint(s) Nausea and Rectal Bleeding  HPI SAMIT SYLVE is a 66 y.o. 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 intake and rectal bleeding.  History obtained from patient and patient's family member who states that he constantly complains of persistent nausea and abdominal pain and it is causing him to take less p.o.  He had an episode of bright red blood per rectum yesterday that consisted of brown stool with streaks of blood.  Here in the emergency department, patient denies chest pain, shortness of breath, headache, fever or other systemic symptoms.   Past Medical History Past Medical History:  Diagnosis Date   Arthritis    Essential hypertension    Hyperlipidemia    Kidney stones    Patient Active Problem List   Diagnosis Date Noted   Esophagitis 12/26/2021   Right hemiplegia ([place]) 12/17/2021   History of ischemic left ICA stroke 12/10/2021   Acute left ICA ischemic stroke ([place]) 12/10/2021   CAD (coronary artery disease) 12/05/2021   Ischemic cardiomyopathy 12/05/2021   NSVT (nonsustained ventricular tachycardia) ([place]) 12/05/2021   Hypertension 12/05/2021   Hyperlipidemia 12/05/2021   NSTEMI (non-ST elevated myocardial infarction) ([place]) 12/04/2021   Colon cancer screening    Loss of weight 09/04/2015   Encounter for screening colonoscopy 09/04/2015   Home Medication(s) Prior to Admission medications   Medication Sig Start Date End Date Taking? Authorizing Provider  acetaminophen (TYLENOL) 650 MG CR tablet Take 650 mg by mouth every 8 (eight) hours as needed for pain.   Yes [provider]  alum & mag hydroxide-simeth (MAALOX PLUS) 400-400-40 MG/5ML suspension Take 15 mLs by mouth every 6 (six) hours as needed for  indigestion (pain with swallowing). 12/24/21  Yes Mesner, Corene Cornea, MD  aspirin EC 81 MG tablet Take 1 tablet (81 mg total) by mouth daily. Swallow whole. 12/07/21  Yes Margie Billet, NP  atorvastatin (LIPITOR) 40 MG tablet Take 1 tablet (40 mg total) by mouth daily. 01/01/22  Yes Gerlene Fee, NP  busPIRone (BUSPAR) 7.5 MG tablet Take 1 tablet (7.5 mg total) by mouth 2 (two) times daily. 01/01/22  Yes Gerlene Fee, NP  clopidogrel (PLAVIX) 75 MG tablet Take 1 tablet (75 mg total) by mouth daily with breakfast. 01/01/22  Yes Gerlene Fee, NP  isosorbide dinitrate (ISORDIL) 20 MG tablet Take 1 tablet (20 mg total) by mouth 2 (two) times daily. 01/01/22  Yes Gerlene Fee, NP  losartan (COZAAR) 25 MG tablet Take 1 tablet (25 mg total) by mouth daily. 01/01/22  Yes Gerlene Fee, NP  metoprolol tartrate (LOPRESSOR) 25 MG tablet Take 0.5 tablets (12.5 mg total) by mouth 2 (two) times daily. 01/01/22  Yes Gerlene Fee, NP  nitroGLYCERIN (NITROSTAT) 0.4 MG SL tablet Place 1 tablet (0.4 mg total) under the tongue every 5 (five) minutes x 3 doses as needed for chest pain. 01/01/22  Yes Gerlene Fee, NP  omeprazole (PRILOSEC) 20 MG capsule Take 1 capsule (20 mg total) by mouth daily. 01/20/22  Yes Leath-Warren, Alda Lea, NP  ondansetron (ZOFRAN-ODT) 4 MG disintegrating tablet Take 1 tablet (4 mg total) by mouth every 8 (eight) hours as needed for nausea or vomiting. 01/20/22  Yes Leath-Warren, Alda Lea, NP  pantoprazole sodium (PROTONIX) 40 mg Take 40 mg by  mouth daily. 01/01/22  Yes Gerlene Fee, NP  spironolactone (ALDACTONE) 25 MG tablet Take 0.5 tablets (12.5 mg total) by mouth daily. 01/01/22  Yes Gerlene Fee, NP  traZODone (DESYREL) 50 MG tablet Take 50 mg by mouth at bedtime as needed for sleep. 01/08/22  Yes [provider]                                                                                                                                    Past Surgical History Past  Surgical History:  Procedure Laterality Date   CHOLECYSTECTOMY  1990s   COLONOSCOPY WITH PROPOFOL N/A 09/30/2015   Procedure: COLONOSCOPY WITH PROPOFOL;  Surgeon: Daneil Dolin, MD;  [facility];  Service: Endoscopy;  Laterality: N/A;  930   IR CT HEAD LTD  12/10/2021   IR PERCUTANEOUS ART THROMBECTOMY/INFUSION INTRACRANIAL INC DIAG ANGIO  12/10/2021   IR US GUIDE VASC ACCESS RIGHT  12/10/2021   KIDNEY STONE SURGERY  2005   LEFT HEART CATH AND CORONARY ANGIOGRAPHY N/A 12/04/2021   Procedure: LEFT HEART CATH AND CORONARY ANGIOGRAPHY;  Surgeon: Belva Crome, MD;  Location: [place] [facility];  Service: Cardiovascular;  Laterality: N/A;   LOOP RECORDER INSERTION N/A 12/16/2021   Procedure: LOOP RECORDER INSERTION;  Surgeon: Constance Haw, MD;  Location: [place] [facility];  Service: Cardiovascular;  Laterality: N/A;   RADIOLOGY WITH ANESTHESIA N/A 12/10/2021   Procedure: IR WITH ANESTHESIA;  Surgeon: Luanne Bras, MD;  Location: [place];  Service: Radiology;  Laterality: N/A;   Family History Family History  Problem Relation Age of Onset   Coronary artery disease Sister    Colon cancer Neg Hx     Social History Social History   Tobacco Use   Smoking status: Former    Types: Cigarettes    Quit date: 08/05/2015    Years since quitting: 6.4   Smokeless tobacco: Never  Vaping Use   Vaping Use: Never used  Substance Use Topics   Alcohol use: Not Currently    Alcohol/week: 0.0 standard drinks of alcohol    Comment: No ETOH in a month. Variable, up to daily, as much as much as a fifth of liquor per sitting.   Drug use: No    Frequency: 7.0 times per week    Comment: As often as daily when he does smoke marijuana   Allergies Penicillins  Review of Systems Review of Systems  Gastrointestinal:  Positive for abdominal pain, blood in stool, nausea and vomiting.    Physical Exam Vital Signs  I have reviewed the triage vital signs BP 123/64   Pulse 66   Temp  98.4 F (36.9 C) (Oral)   Resp (!) 21   Ht 6\' 2"  (1.88 m)   Wt 77 kg   SpO2 97%   BMI 21.80 kg/m   Physical Exam Constitutional:      General:  He is not in acute distress.    Appearance: Normal appearance.  HENT:     Head: Normocephalic and atraumatic.     Nose: No congestion or rhinorrhea.  Eyes:     General:        Right eye: No discharge.        Left eye: No discharge.     Extraocular Movements: Extraocular movements intact.     Pupils: Pupils are equal, round, and reactive to light.  Cardiovascular:     Rate and Rhythm: Normal rate and regular rhythm.     Heart sounds: No murmur heard. Pulmonary:     Effort: No respiratory distress.     Breath sounds: No wheezing or rales.  Abdominal:     General: There is no distension.     Tenderness: There is no abdominal tenderness.  Genitourinary:    Rectum: Normal. Guaiac result negative.  Musculoskeletal:        General: Normal range of motion.     Cervical back: Normal range of motion.  Skin:    General: Skin is warm and dry.  Neurological:     General: No focal deficit present.     Mental Status: He is alert.     ED Results and Treatments Labs (all labs ordered are listed, but only abnormal results are displayed) Labs Reviewed  COMPREHENSIVE METABOLIC PANEL - Abnormal; Notable for the following components:      Result Value   Sodium 133 (*)    Calcium 8.7 (*)    AST 45 (*)    ALT 78 (*)    All other components within normal limits  CBC WITH DIFFERENTIAL/PLATELET - Abnormal; Notable for the following components:   RBC 4.21 (*)    All other components within normal limits  LIPASE, BLOOD  POC OCCULT BLOOD, ED                                                                                                                          Radiology No results found.  Pertinent labs & imaging results that were available during my care of the patient were reviewed by me and considered in my medical decision making (see  MDM for details).  Medications Ordered in ED Medications - No data to display  Procedures Procedures  (including critical care time)  Medical Decision Making / ED Course   This patient presents to the ED for concern of nausea, abdominal pain, bright red blood per rectum, this involves an extensive number of treatment options, and is a complaint that carries with it a high risk of complications and morbidity.  The differential diagnosis includes lower GI bleed, gastritis, peptic ulcer disease, pancreatitis, malignancy  MDM: Patient seen the emergency room for evaluation of abdominal pain, nausea, vomiting and rectal bleeding.  Physical exam largely unremarkable including a negative rectal exam and negative stool guaiac.  Laboratory evaluation with a small transaminitis with AST 45, ALT 78 but is otherwise unremarkable.  At time of signout, patient pending CT abdomen pelvis.  Plan will be to discuss care with gastroenterology and connect patient for possible outpatient EGD if all is normal.  Please see provider signout for continuation of work-up.   Additional history obtained: -Additional history obtained from family -External records from outside source obtained and reviewed including: Chart review including previous notes, labs, imaging, consultation notes   Lab Tests: -I ordered, reviewed, and interpreted labs.   The pertinent results include:   Labs Reviewed  COMPREHENSIVE METABOLIC PANEL - Abnormal; Notable for the following components:      Result Value   Sodium 133 (*)    Calcium 8.7 (*)    AST 45 (*)    ALT 78 (*)    All other components within normal limits  CBC WITH DIFFERENTIAL/PLATELET - Abnormal; Notable for the following components:   RBC 4.21 (*)    All other components within normal limits  LIPASE, BLOOD  POC OCCULT BLOOD, ED       EKG   EKG Interpretation  Date/Time:  Thursday January 22 2022 11:26:34 EDT Ventricular Rate:  62 PR Interval:  141 QRS Duration: 82 QT Interval:  420 QTC Calculation: 427 R Axis:   53 Text Interpretation: Sinus rhythm Confirmed by Sidney ([id number]) on 01/22/2022 3:38:47 PM         Imaging Studies ordered: I ordered imaging studies including CT abdomen pelvis I independently visualized and interpreted imaging. I agree with the radiologist interpretation   Medicines ordered and prescription drug management: No orders of the defined types were placed in this encounter.   -I have reviewed the patients home medicines and have made adjustments as needed  Critical interventions none    Cardiac Monitoring: The patient was maintained on a cardiac monitor.  I personally viewed and interpreted the cardiac monitored which showed an underlying rhythm of: NSR  Social Determinants of Health:  Factors impacting patients care include: none   Reevaluation: After the interventions noted above, I reevaluated the patient and found that they have :improved  Co morbidities that complicate the patient evaluation  Past Medical History:  Diagnosis Date   Arthritis    Essential hypertension    Hyperlipidemia    Kidney stones       Dispostion: I considered admission for this patient, and disposition pending CT imaging     Final Clinical Impression(s) / ED Diagnoses Final diagnoses:  None     @PCDICTATION @    Teressa Lower, MD 01/22/22 1541

## 2022-01-22 NOTE — ED Triage Notes (Addendum)
Pt c/o of nausea x4 days. Pt states he seen bright red blood in the toilet this morning. BP 104/78.

## 2022-01-22 NOTE — ED Provider Notes (Addendum)
  Physical Exam  BP 123/64   Pulse 66   Temp 98.4 F (36.9 C) (Oral)   Resp (!) 21   Ht 6\' 2"  (1.88 m)   Wt 77 kg   SpO2 97%   BMI 21.80 kg/m   Physical Exam  Procedures  Procedures  ED Course / MDM    Medical Decision Making Amount and/or Complexity of Data Reviewed Labs: ordered. Radiology: ordered.  Risk Prescription drug management.   Pt with hx of HTN, Recent ICA stroke, reduced po intake, gastritis like symptoms. Nausea x 4 days. BRBPR - but neg melena here, guaic neg.  CT pending. Patient informs me that he has had difficulty in swelling for a while now.  I reviewed the swallow evaluation from the time of stroke, it appears that at that time he was having difficulty with swallowing as well.   Varney Biles, MD 01/22/22 1513   8:11 PM CT shows possible pyelonephritis.  Patient has provided urine.  He does not have any urinary symptoms.  However, I am wondering if the nausea he is having that we cannot clearly explain could be because of pyelo. We will start him on Keflex.  Results discussed with the patient.  We also consulted GI.  Dr. Abbey Chatters will set up an appointment with the patient next week.  Patient is supposed to see PCP next week as well.  Strict ER return precautions have been discussed, and patient is agreeing with the plan and is comfortable with the workup done and the recommendations from the ER.    Varney Biles, MD 01/22/22 2012     Varney Biles, MD 01/22/22 [phone]

## 2022-01-22 NOTE — Discharge Instructions (Addendum)
Take the antibiotics that are prescribed for suspected kidney infection. Continue to take nausea medicine only if the symptoms are severe.  Dr. Abbey Chatters, GI doctor should contact you for a follow-up appointment tomorrow.  Make sure that you continue to see your neurologist.  Make sure that you continue to get speech therapy, and neurology is working on swallowing.

## 2022-01-24 LAB — URINE CULTURE: Culture: NO GROWTH

## 2022-01-28 ENCOUNTER — Encounter: Payer: Self-pay | Admitting: Internal Medicine

## 2022-01-28 ENCOUNTER — Ambulatory Visit (INDEPENDENT_AMBULATORY_CARE_PROVIDER_SITE_OTHER): Payer: Medicare HMO | Admitting: Internal Medicine

## 2022-01-28 VITALS — BP 109/73 | HR 76 | Temp 98.0°F | Ht 74.0 in | Wt 168.0 lb

## 2022-01-28 DIAGNOSIS — R1312 Dysphagia, oropharyngeal phase: Secondary | ICD-10-CM | POA: Diagnosis not present

## 2022-01-28 DIAGNOSIS — R110 Nausea: Secondary | ICD-10-CM

## 2022-01-28 DIAGNOSIS — K625 Hemorrhage of anus and rectum: Secondary | ICD-10-CM

## 2022-01-28 DIAGNOSIS — K219 Gastro-esophageal reflux disease without esophagitis: Secondary | ICD-10-CM | POA: Diagnosis not present

## 2022-01-28 DIAGNOSIS — K5900 Constipation, unspecified: Secondary | ICD-10-CM

## 2022-01-28 NOTE — Progress Notes (Signed)
Primary Care Physician:  [facility] [facility] Primary Gastroenterologist:  Dr. Abbey Chatters  Chief Complaint  Patient presents with   Follow-up    Patient arrives with wife Drucilla for ED follow up on nausea and rectal bleeding. Reports nausea is better and has not seen any rectal bleeding since coming home from ED.  Does have some reflux. Takes maalox as needed. And has omeprazole that was prescribed at ED but has not taken any. Does take pantoprazole as needed.    HPI:   Terry Mayo is a 66 y.o. male who presents to the clinic today for ER follow-up visit.  Seen in the ER at [facility] 01/22/2022 for nausea vomiting and rectal bleeding.  CT abdomen pelvis showed possible evidence of pyelonephritis.  Discharged home on Keflex which she recently started.  Today states his nausea and vomiting is improved.  Notes recent stroke in August now with oral pharyngeal dysphagia.  Following with speech therapy.  Recently increased diet.  Not quite to solid food yet.  No esophageal dysphagia.  Does note chronic reflux for which she takes omeprazole 20 mg daily.  Well controlled for the most part.  Regards to his rectal bleeding, states this was 1 episode, bright red blood.  Does note straining during bowel movement.  Has started taking milk of magnesia which helps.  Takes this as needed.  No rectal pain or discomfort.  Last colonoscopy 2017 unremarkable with recommended 10-year recall.  Past Medical History:  Diagnosis Date   Arthritis    Essential hypertension    Hyperlipidemia    Kidney stones     Past Surgical History:  Procedure Laterality Date   CHOLECYSTECTOMY  1990s   COLONOSCOPY WITH PROPOFOL N/A 09/30/2015   Procedure: COLONOSCOPY WITH PROPOFOL;  Surgeon: Daneil Dolin, MD;  [facility];  Service: Endoscopy;  Laterality: N/A;  930   IR CT HEAD LTD  12/10/2021   IR PERCUTANEOUS ART THROMBECTOMY/INFUSION INTRACRANIAL INC DIAG ANGIO  12/10/2021   IR  US GUIDE VASC ACCESS RIGHT  12/10/2021   KIDNEY STONE SURGERY  2005   LEFT HEART CATH AND CORONARY ANGIOGRAPHY N/A 12/04/2021   Procedure: LEFT HEART CATH AND CORONARY ANGIOGRAPHY;  Surgeon: Belva Crome, MD;  Location: [place] [facility];  Service: Cardiovascular;  Laterality: N/A;   LOOP RECORDER INSERTION N/A 12/16/2021   Procedure: LOOP RECORDER INSERTION;  Surgeon: Constance Haw, MD;  Location: [place] [facility];  Service: Cardiovascular;  Laterality: N/A;   RADIOLOGY WITH ANESTHESIA N/A 12/10/2021   Procedure: IR WITH ANESTHESIA;  Surgeon: Luanne Bras, MD;  Location: [place];  Service: Radiology;  Laterality: N/A;    Current Outpatient Medications  Medication Sig Dispense Refill   alum & mag hydroxide-simeth (MAALOX PLUS) 400-400-40 MG/5ML suspension Take 15 mLs by mouth every 6 (six) hours as needed for indigestion (pain with swallowing). 355 mL 0   aspirin EC 81 MG tablet Take 1 tablet (81 mg total) by mouth daily. Swallow whole. 30 tablet 3   atorvastatin (LIPITOR) 40 MG tablet Take 1 tablet (40 mg total) by mouth daily. 30 tablet 0   busPIRone (BUSPAR) 7.5 MG tablet Take 1 tablet (7.5 mg total) by mouth 2 (two) times daily. 60 tablet 0   cephALEXin (KEFLEX) 500 MG capsule Take 1 capsule (500 mg total) by mouth 3 (three) times daily. 30 capsule 0   clopidogrel (PLAVIX) 75 MG tablet Take 1 tablet (75 mg total) by  mouth daily with breakfast. 30 tablet 0   losartan (COZAAR) 25 MG tablet Take 1 tablet (25 mg total) by mouth daily. 30 tablet 0   metoprolol tartrate (LOPRESSOR) 25 MG tablet Take 0.5 tablets (12.5 mg total) by mouth 2 (two) times daily. 60 tablet 0   nitroGLYCERIN (NITROSTAT) 0.4 MG SL tablet Place 1 tablet (0.4 mg total) under the tongue every 5 (five) minutes x 3 doses as needed for chest pain. 20 tablet 0   omeprazole (PRILOSEC) 20 MG capsule Take 1 capsule (20 mg total) by mouth daily. 30 capsule 0   pantoprazole sodium (PROTONIX) 40 mg Take 40 mg by mouth  daily. 30 each 0   spironolactone (ALDACTONE) 25 MG tablet Take 0.5 tablets (12.5 mg total) by mouth daily. 15 tablet 0   traZODone (DESYREL) 50 MG tablet Take 50 mg by mouth at bedtime as needed for sleep.     isosorbide dinitrate (ISORDIL) 20 MG tablet Take 1 tablet (20 mg total) by mouth 2 (two) times daily. 60 tablet 0   ondansetron (ZOFRAN-ODT) 4 MG disintegrating tablet Take 1 tablet (4 mg total) by mouth every 8 (eight) hours as needed for nausea or vomiting. (Patient not taking: Reported on 01/28/2022) 20 tablet 0   No current facility-administered medications for this visit.    Allergies as of 01/28/2022 - Review Complete 01/28/2022  Allergen Reaction Noted   Penicillins  01/11/2012    Family History  Problem Relation Age of Onset   Coronary artery disease Sister    Colon cancer Neg Hx     Social History   Socioeconomic History   Marital status: Single    Spouse name: Not on file   Number of children: Not on file   Years of education: Not on file   Highest education level: Not on file  Occupational History   Not on file  Tobacco Use   Smoking status: Former    Types: Cigarettes    Quit date: 08/05/2015    Years since quitting: 6.4    Passive exposure: Past   Smokeless tobacco: Never  Vaping Use   Vaping Use: Never used  Substance and Sexual Activity   Alcohol use: Not Currently    Alcohol/week: 0.0 standard drinks of alcohol    Comment: No ETOH in a month. Variable, up to daily, as much as much as a fifth of liquor per sitting.   Drug use: No    Frequency: 7.0 times per week    Comment: As often as daily when he does smoke marijuana   Sexual activity: Yes    Birth control/protection: None  Other Topics Concern   Not on file  Social History Narrative   Not on file   Social Determinants of Health   Financial Resource Strain: Not on file  Food Insecurity: Not on file  Transportation Needs: Not on file  Physical Activity: Not on file  Stress: Not on file   Social Connections: Not on file  Intimate Partner Violence: Not on file    Subjective: Review of Systems  Constitutional:  Negative for chills and fever.  HENT:  Negative for congestion and hearing loss.   Eyes:  Negative for blurred vision and double vision.  Respiratory:  Negative for cough and shortness of breath.   Cardiovascular:  Negative for chest pain and palpitations.  Gastrointestinal:  Positive for blood in stool and nausea. Negative for abdominal pain, constipation, diarrhea, heartburn, melena and vomiting.       Oropharyngeal  dysphagia  Genitourinary:  Negative for dysuria and urgency.  Musculoskeletal:  Negative for joint pain and myalgias.  Skin:  Negative for itching and rash.  Neurological:  Negative for dizziness and headaches.  Psychiatric/Behavioral:  Negative for depression. The patient is not nervous/anxious.        Objective: BP 109/73 (BP Location: Left Arm, Patient Position: Sitting, Cuff Size: Normal)   Pulse 76   Temp 98 F (36.7 C) (Oral)   Ht 6\' 2"  (1.88 m)   Wt 168 lb (76.2 kg)   BMI 21.57 kg/m  Physical Exam Constitutional:      Appearance: Normal appearance.  HENT:     Head: Normocephalic and atraumatic.  Eyes:     Extraocular Movements: Extraocular movements intact.     Conjunctiva/sclera: Conjunctivae normal.  Cardiovascular:     Rate and Rhythm: Normal rate and regular rhythm.  Pulmonary:     Effort: Pulmonary effort is normal.     Breath sounds: Normal breath sounds.  Abdominal:     General: Bowel sounds are normal.     Palpations: Abdomen is soft.  Musculoskeletal:        General: Normal range of motion.     Cervical back: Normal range of motion and neck supple.  Skin:    General: Skin is warm.  Neurological:     General: No focal deficit present.     Mental Status: He is alert and oriented to person, place, and time.     Comments: Right hemiplegia  Psychiatric:        Mood and Affect: Mood normal.        Behavior:  Behavior normal.      Assessment/Plan 1.  Nausea-improved, CT with possible pyelonephritis.  Continue antibiotics to completion.  If does not improve as expected then call with office.  Patient has as needed Zofran to take at home.  2.  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  Following with speech therapy.  Continue current diet.  Hopefully this is to improve over time.  Counseled on adequate caloric intake given his recent weight loss.  3.  Rectal bleeding-1 episode, likely hemorrhoidal in the setting of constipation.  Recommended be more aggressive with bowel regimen.  Milk of magnesia daily.  If this is not adequate can add on 1 capful of MiraLAX 1-2 times daily.  Discussed further evaluation with EGD and colonoscopy.  Given recent stroke and MI, would hold off for now.  Patient needs to be on Plavix and aspirin for at least 6 months.   01/28/2022 2:51 PM   Disclaimer: This note was dictated with voice recognition software. Similar sounding words can inadvertently be transcribed and may not be corrected upon review.

## 2022-01-28 NOTE — Patient Instructions (Addendum)
Continue antibiotics through completion for your kidney infection.  We will hold off on any procedures for now given your recent stroke and heart attack.  Continue on your aspirin and Plavix.  If you do not continue to improve as expected, then let us know.    I Wwnt you to start taking milk of magnesia every day for your constipation.  If this is not adequate you can add on 1 capful of MiraLAX 1-2 times daily as well.  Follow-up in 3 months.  It was very nice seeing both you today.  Dr. Abbey Chatters

## 2022-02-02 ENCOUNTER — Ambulatory Visit: Payer: Medicare HMO | Admitting: Cardiovascular Disease

## 2022-02-02 ENCOUNTER — Other Ambulatory Visit: Payer: Self-pay | Admitting: Adult Health

## 2022-02-02 NOTE — Progress Notes (Signed)
[facility] Summary Report / Loop Recorder 

## 2022-02-05 ENCOUNTER — Other Ambulatory Visit (HOSPITAL_COMMUNITY): Payer: Self-pay | Admitting: Specialist

## 2022-02-05 DIAGNOSIS — R1312 Dysphagia, oropharyngeal phase: Secondary | ICD-10-CM

## 2022-02-05 DIAGNOSIS — Z8673 Personal history of transient ischemic attack (TIA), and cerebral infarction without residual deficits: Secondary | ICD-10-CM

## 2022-02-10 ENCOUNTER — Encounter: Payer: Self-pay | Admitting: Internal Medicine

## 2022-02-10 ENCOUNTER — Other Ambulatory Visit (HOSPITAL_COMMUNITY)
Admission: RE | Admit: 2022-02-10 | Discharge: 2022-02-10 | Disposition: A | Discharge status: Home / Self Care | Payer: Medicare HMO | Source: Ambulatory Visit | Attending: Internal Medicine | Admitting: Internal Medicine

## 2022-02-10 ENCOUNTER — Ambulatory Visit: Payer: Medicare HMO | Attending: Cardiovascular Disease | Admitting: Internal Medicine

## 2022-02-10 VITALS — BP 98/64 | HR 95 | Ht 74.0 in | Wt 168.0 lb

## 2022-02-10 DIAGNOSIS — I25118 Atherosclerotic heart disease of native coronary artery with other forms of angina pectoris: Secondary | ICD-10-CM | POA: Insufficient documentation

## 2022-02-10 DIAGNOSIS — I513 Intracardiac thrombosis, not elsewhere classified: Secondary | ICD-10-CM

## 2022-02-10 LAB — COMPREHENSIVE METABOLIC PANEL
ALT: 193 U/L — ABNORMAL HIGH (ref 0–44)
AST: 109 U/L — ABNORMAL HIGH (ref 15–41)
Albumin: 4.1 g/dL (ref 3.5–5.0)
Alkaline Phosphatase: 86 U/L (ref 38–126)
Anion gap: 8 (ref 5–15)
BUN: 13 mg/dL (ref 8–23)
CO2: 26 mmol/L (ref 22–32)
Calcium: 9.4 mg/dL (ref 8.9–10.3)
Chloride: 106 mmol/L (ref 98–111)
Creatinine, Ser: 1.07 mg/dL (ref 0.61–1.24)
GFR, Estimated: >60 mL/min (ref >60)
Glucose, Bld: 108 mg/dL — ABNORMAL HIGH (ref 70–99)
Potassium: 3.9 mmol/L (ref 3.5–5.1)
Sodium: 140 mmol/L (ref 135–145)
Total Bilirubin: 0.8 mg/dL (ref 0.3–1.2)
Total Protein: 7.4 g/dL (ref 6.5–8.1)

## 2022-02-10 MED ORDER — ATORVASTATIN CALCIUM 40 MG PO TABS: 40.0000 mg | ORAL_TABLET | Freq: Every day | ORAL | 3 refills | Status: DC

## 2022-02-10 NOTE — Progress Notes (Signed)
Cardiology Office Note  Date: 02/10/2022   ID: Terry Mayo, DOB 02-20-1956, MRN [id number]  PCP:  [facility]  Cardiologist:  None Electrophysiologist:  None   Reason for Office Visit: Post hospitalization follow-up  History of Present Illness: Terry Mayo is a 66 y.o. 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 Patient accompanied by wife. Denied any angina, DOE, dizziness, lightheadedness, syncope, claudication and LE swelling. Denies smoking cigarettes ever.  Compliant with medications and no side effects. Patient currently has dysphagia can take liquid diet and pured diet.  Working with speech therapy.  Wife stated that patient was scheduled for EGD/colonoscopy in the upcoming January 2024. Patient currently working with physical therapy to regain his motor strength.  Past Medical History:  Diagnosis Date   Arthritis    Essential hypertension    Hyperlipidemia    Kidney stones     Past Surgical History:  Procedure Laterality Date   CHOLECYSTECTOMY  1990s   COLONOSCOPY WITH PROPOFOL N/A 09/30/2015   Procedure: COLONOSCOPY WITH PROPOFOL;  Surgeon: Corbin Ade, MD;  Location: [facility];  Service: Endoscopy;  Laterality: N/A;  930   IR CT HEAD LTD  12/10/2021   IR PERCUTANEOUS ART THROMBECTOMY/INFUSION INTRACRANIAL INC DIAG ANGIO  12/10/2021   IR US GUIDE VASC ACCESS RIGHT  12/10/2021   KIDNEY STONE SURGERY  2005   LEFT HEART CATH AND CORONARY ANGIOGRAPHY N/A 12/04/2021   Procedure: LEFT HEART CATH AND CORONARY ANGIOGRAPHY;  Surgeon: Lyn Records, MD;  Location: [facility] INVASIVE CV LAB;  Service: Cardiovascular;  Laterality: N/A;   LOOP RECORDER INSERTION N/A  12/16/2021   Procedure: LOOP RECORDER INSERTION;  Surgeon: Regan Lemming, MD;  Location: [facility] INVASIVE CV LAB;  Service: Cardiovascular;  Laterality: N/A;   RADIOLOGY WITH ANESTHESIA N/A 12/10/2021   Procedure: IR WITH ANESTHESIA;  Surgeon: Julieanne Cotton, MD;  Location: [facility] OR;  Service: Radiology;  Laterality: N/A;    Current Outpatient Medications  Medication Sig Dispense Refill   alum & mag hydroxide-simeth (MAALOX PLUS) 400-400-40 MG/5ML suspension Take 15 mLs by mouth every 6 (six) hours as needed for indigestion (pain with swallowing). 355 mL 0   aspirin EC 81 MG tablet Take 1 tablet (81 mg total) by mouth daily. Swallow whole. 30 tablet 3   busPIRone (BUSPAR) 7.5 MG tablet Take 1 tablet (7.5 mg total) by mouth 2 (two) times daily. 60 tablet 0   cephALEXin (KEFLEX) 500 MG capsule Take 1 capsule (500 mg total) by mouth 3 (three) times daily. 30 capsule 0   clopidogrel (PLAVIX) 75 MG tablet Take 1 tablet (75 mg total) by mouth daily with breakfast. 30 tablet 0   hydrOXYzine (VISTARIL) 25 MG capsule Take 25 mg by mouth 2 (two) times daily.     isosorbide dinitrate (ISORDIL) 20 MG tablet Take 1 tablet (20 mg total) by mouth 2 (two) times daily. 60 tablet 0   losartan (COZAAR) 25 MG tablet Take 1 tablet (25 mg total) by mouth daily. 30 tablet 0   metoprolol tartrate (LOPRESSOR) 25 MG tablet Take 0.5 tablets (12.5 mg total) by mouth 2 (two) times daily. 60 tablet 0   nitroGLYCERIN (NITROSTAT) 0.4 MG SL tablet Place  1 tablet (0.4 mg total) under the tongue every 5 (five) minutes x 3 doses as needed for chest pain. 20 tablet 0   omeprazole (PRILOSEC) 20 MG capsule Take 1 capsule (20 mg total) by mouth daily. 30 capsule 0   ondansetron (ZOFRAN-ODT) 4 MG disintegrating tablet Take 1 tablet (4 mg total) by mouth every 8 (eight) hours as needed for nausea or vomiting. 20 tablet 0   pantoprazole sodium (PROTONIX) 40 mg Take 40 mg by mouth daily. 30 each 0   spironolactone (ALDACTONE) 25 MG  tablet Take 0.5 tablets (12.5 mg total) by mouth daily. 15 tablet 0   traZODone (DESYREL) 50 MG tablet Take 50 mg by mouth at bedtime as needed for sleep.     atorvastatin (LIPITOR) 40 MG tablet Take 1 tablet (40 mg total) by mouth at bedtime. 30 tablet 3   No current facility-administered medications for this visit.   Allergies:  Penicillins   Social History: The patient  reports that he quit smoking about 6 years ago. His smoking use included cigarettes. He has been exposed to tobacco smoke. He has never used smokeless tobacco. He reports that he does not currently use alcohol. He reports that he does not use drugs.   Family History: The patient's family history includes Coronary artery disease in his sister.   ROS:  Please see the history of present illness. Otherwise, complete review of systems is positive for none.  All other systems are reviewed and negative.   Physical Exam: VS:  BP 98/64   Pulse 95   Ht 6\' 2"  (1.88 m)   Wt 168 lb (76.2 kg)   SpO2 97%   BMI 21.57 kg/m , BMI Body mass index is 21.57 kg/m.  Wt Readings from Last 3 Encounters:  02/10/22 168 lb (76.2 kg)  01/28/22 168 lb (76.2 kg)  01/22/22 169 lb 12.1 oz (77 kg)    General: Patient appears comfortable at rest. HEENT: Conjunctiva and lids normal, oropharynx clear with moist mucosa. Neck: Supple, no elevated JVP or carotid bruits, no thyromegaly. Lungs: Clear to auscultation, nonlabored breathing at rest. Cardiac: Regular rate and rhythm, no S3 or significant systolic murmur, no pericardial rub. Abdomen: Soft, nontender, no hepatomegaly, bowel sounds present, no guarding or rebound. Extremities: No pitting edema, distal pulses 2+. Skin: Warm and dry. Musculoskeletal: No kyphosis. Neuropsychiatric: Alert and oriented x3, RUE and RLE weakness and decreased sensation  ECG:  An ECG dated recently in 09/23 was personally reviewed today and demonstrated:  NSR and no ST-T changes  Recent Labwork: 12/04/2021: B  Natriuretic Peptide 48.0 12/05/2021: Magnesium 1.9 01/22/2022: ALT 78; AST 45; BUN 17; Creatinine, Ser 1.06; Hemoglobin 13.6; Platelets 202; Potassium 4.3; Sodium 133     Component Value Date/Time   CHOL 99 12/05/2021 0317   TRIG 58 12/11/2021 0612   HDL 50 12/05/2021 0317   CHOLHDL 2.0 12/05/2021 0317   VLDL 8 12/05/2021 0317   LDLCALC 41 12/05/2021 0317    Other Studies Reviewed Today: Echo on 12/05/21 False tendon in the apical LV of no clinical significance. Left ventricular ejection fraction, by estimation, is 45 to 50%. The left ventricle has mildly decreased function. The left ventricle demonstrates regional wall motion abnormalities (see scoring diagram/findings for description). Left ventricular diastolic parameters are indeterminate. There is akinesis of the left ventricular, apical septal wall and anterior wall. There is hypokinesis of the left ventricular, mid anterior wall. There is akinesis of the left ventricular, apical segment. There is akinesis of  the left ventricular, mid anteroseptal wall. Right ventricular systolic function is normal. The right ventricular size is normal. A Prominent moderator band is visualized. There is normal pulmonary artery systolic pressure. The estimated right ventricular systolic pressure is 33.4 mmHg. The mitral valve is normal in structure. Trivial mitral valve regurgitation. No evidence of mitral stenosis. The aortic valve is normal in structure. Aortic valve regurgitation is not visualized. No aortic stenosis is present. The inferior vena cava is dilated in size with >50% respiratory variability, suggesting right atrial pressure of 8 mmHg.  LHC on 12/04/21 CONCLUSIONS: The left coronary anatomy is ambiguous because of multiple diagonals as ramus light branches.  But we can never find the stump. There are faint hint of thrombus distal or around the origin of the large septal perforator. The LAD is totally occluded and now has  collaterals from the RCA at the apex. Left main is widely patent Circumflex gives origin to 2 large obtuse marginal branches that arise from a common stent.  The more medial branch is larger and has moderate proximal disease up to 60%. Right coronary contains codominant anatomy and moderate mid vessel disease up to 60 to 70% in the mid vessel.  It supplies collaterals around the apex to the LAD. Left ventriculography demonstrates inferoapical and apical moderate hypokinesis.  EF 40 to 50%.  LVEDP upper normal at 17 mmHg.   Recommend: Aspirin and Plavix for at least 6 months. Aggressive lipid-lowering. Nitroglycerin if recurrent chest pain.  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  #CAD manifested by NSTEMI on 12/04/21 s/p multivessel CAD (pLAD total occlusion, dLCx 60% and mRCA 70%) with LVEF 45-50%, currently angina free PLAN -Continue aspirin 81 mg once daily and Plavix 75 mg once daily. Continue DAPT for at least 6 months, to a total duration of 1 year. Any elective procedures need to be deferred until completion of 6 months of DAPT. -Continue atorvastatin 40 mg nightly. Instructed patient to take statin only in the nighttime especially at bedtime. -Continue metoprolol tartrate 12.5 mg twice daily, losartan 25 mg once daily -SL NTG 0.4 mg as needed  #HLD, currently at goal (LDL<70 mg/dL) PLAN -Continue atorvastatin 40 mg nightly. Instructed patient to take statin only in the nighttime especially at bedtime. LFTs are mildly elevated two weeks ago. Obtain CMP today. If normal, will resume Lipitor. Otherwise, will hold statin and send pharmacy consult  for PCSK9 inhibitors.Goal LDL<70 mg/dL.  #Ischemic cardiomyopathy, LVEF 45-50%, currently compensated PLAN -Continue metoprolol tartrate 12.5 mg twice daily, losartan 25 mg once daily, spironolactone 12.5 mg once daily, ISDN 20 mg twice daily -The most recent Echo showed sub-optimal endocardial visualization and any LV thrombus cannot be ruled out. Obtain Limited Echo with contrast to r/o LV thrombus and for regional wall function.  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  > 50% of time was spent in direct patient care.      Medication Adjustments/Labs and Tests Ordered: Current medicines are reviewed at length with the patient today.  Concerns regarding medicines are outlined above.   Tests Ordered: Orders Placed This Encounter  Procedures   Comprehensive Metabolic Panel (CMET)   ECHOCARDIOGRAM LIMITED    Medication Changes: Meds ordered this encounter  Medications   atorvastatin (LIPITOR) 40 MG tablet    Sig: Take 1 tablet (40 mg total) by mouth at bedtime.    Dispense:  30 tablet    Refill:  3    Disposition:  Follow up  2 weeks  Signed, Beck Cofer Fidel Levy, MD, 02/10/2022 9:31 AM    [facility]. [address], [place], [place] [address]

## 2022-02-10 NOTE — Patient Instructions (Signed)
Medication Instructions:  Your physician recommends that you continue on your current medications as directed. Please refer to the Current Medication list given to you today.   Labwork: Today: -CMET  Testing/Procedures: Your physician has requested that you have an echocardiogram. Echocardiography is a painless test that uses sound waves to create images of your heart. It provides your doctor with information about the size and shape of your heart and how well your heart's chambers and valves are working. This procedure takes approximately one hour. There are no restrictions for this procedure. Please do NOT wear cologne, perfume, aftershave, or lotions (deodorant is allowed). Please arrive 15 minutes prior to your appointment time.   Follow-Up: Follow up with Dr. Dellia Cloud in 2 weeks.   Any Other Special Instructions Will Be Listed Below (If Applicable).     If you need a refill on your cardiac medications before your next appointment, please call your pharmacy.

## 2022-02-11 ENCOUNTER — Encounter (HOSPITAL_COMMUNITY): Payer: Self-pay | Admitting: Emergency Medicine

## 2022-02-11 ENCOUNTER — Other Ambulatory Visit: Payer: Self-pay

## 2022-02-11 ENCOUNTER — Emergency Department (HOSPITAL_COMMUNITY)
Admission: EM | Admit: 2022-02-11 | Discharge: 2022-02-12 | Disposition: A | Discharge status: Home / Self Care | Payer: Medicare HMO | Attending: Emergency Medicine | Admitting: Emergency Medicine

## 2022-02-11 DIAGNOSIS — I2510 Atherosclerotic heart disease of native coronary artery without angina pectoris: Secondary | ICD-10-CM | POA: Insufficient documentation

## 2022-02-11 DIAGNOSIS — R1312 Dysphagia, oropharyngeal phase: Secondary | ICD-10-CM | POA: Diagnosis not present

## 2022-02-11 DIAGNOSIS — I10 Essential (primary) hypertension: Secondary | ICD-10-CM | POA: Diagnosis not present

## 2022-02-11 DIAGNOSIS — R531 Weakness: Secondary | ICD-10-CM | POA: Diagnosis not present

## 2022-02-11 DIAGNOSIS — Z7982 Long term (current) use of aspirin: Secondary | ICD-10-CM | POA: Insufficient documentation

## 2022-02-11 DIAGNOSIS — Z79899 Other long term (current) drug therapy: Secondary | ICD-10-CM | POA: Insufficient documentation

## 2022-02-11 DIAGNOSIS — Z8673 Personal history of transient ischemic attack (TIA), and cerebral infarction without residual deficits: Secondary | ICD-10-CM | POA: Insufficient documentation

## 2022-02-11 DIAGNOSIS — R1310 Dysphagia, unspecified: Secondary | ICD-10-CM | POA: Diagnosis present

## 2022-02-11 NOTE — ED Triage Notes (Signed)
Pt with c/o difficulty swallowing. States he feels like he is chocking. Wife states pt has a swallowing study scheduled here in radiology in am.

## 2022-02-12 ENCOUNTER — Encounter (HOSPITAL_COMMUNITY): Payer: Self-pay | Admitting: Speech Pathology

## 2022-02-12 ENCOUNTER — Ambulatory Visit (HOSPITAL_COMMUNITY): Payer: Medicare HMO | Attending: Emergency Medicine | Admitting: Speech Pathology

## 2022-02-12 ENCOUNTER — Ambulatory Visit (HOSPITAL_COMMUNITY)
Admission: RE | Admit: 2022-02-12 | Discharge: 2022-02-12 | Disposition: A | Discharge status: Home / Self Care | Payer: Medicare HMO | Source: Ambulatory Visit | Attending: Emergency Medicine | Admitting: Emergency Medicine

## 2022-02-12 DIAGNOSIS — R1312 Dysphagia, oropharyngeal phase: Secondary | ICD-10-CM

## 2022-02-12 DIAGNOSIS — Z8673 Personal history of transient ischemic attack (TIA), and cerebral infarction without residual deficits: Secondary | ICD-10-CM | POA: Insufficient documentation

## 2022-02-12 LAB — CBC WITH DIFFERENTIAL/PLATELET
Abs Immature Granulocytes: 0.01 10*3/uL (ref 0.00–0.07)
Basophils Absolute: 0 10*3/uL (ref 0.0–0.1)
Basophils Relative: 1 %
Eosinophils Absolute: 0.4 10*3/uL (ref 0.0–0.5)
Eosinophils Relative: 5 %
HCT: 39.8 % (ref 39.0–52.0)
Hemoglobin: 13.8 g/dL (ref 13.0–17.0)
Immature Granulocytes: 0 %
Lymphocytes Relative: 31 %
Lymphs Abs: 2.5 10*3/uL (ref 0.7–4.0)
MCH: 32.5 pg (ref 26.0–34.0)
MCHC: 34.7 g/dL (ref 30.0–36.0)
MCV: 93.9 fL (ref 80.0–100.0)
Monocytes Absolute: 0.5 10*3/uL (ref 0.1–1.0)
Monocytes Relative: 6 %
Neutro Abs: 4.6 10*3/uL (ref 1.7–7.7)
Neutrophils Relative %: 57 %
Platelets: 183 10*3/uL (ref 150–400)
RBC: 4.24 MIL/uL (ref 4.22–5.81)
RDW: 13.8 % (ref 11.5–15.5)
WBC: 7.9 10*3/uL (ref 4.0–10.5)
nRBC: 0 % (ref 0.0–0.2)

## 2022-02-12 LAB — BASIC METABOLIC PANEL
Anion gap: 7 (ref 5–15)
BUN: 15 mg/dL (ref 8–23)
CO2: 24 mmol/L (ref 22–32)
Calcium: 9.3 mg/dL (ref 8.9–10.3)
Chloride: 106 mmol/L (ref 98–111)
Creatinine, Ser: 1.05 mg/dL (ref 0.61–1.24)
GFR, Estimated: >60 mL/min (ref >60)
Glucose, Bld: 101 mg/dL — ABNORMAL HIGH (ref 70–99)
Potassium: 4 mmol/L (ref 3.5–5.1)
Sodium: 137 mmol/L (ref 135–145)

## 2022-02-12 LAB — TROPONIN I (HIGH SENSITIVITY): Troponin I (High Sensitivity): 7 ng/L (ref <18)

## 2022-02-12 NOTE — Discharge Instructions (Addendum)
Go to your swallow study tomorrow as previously scheduled.  Continue other medications as previously prescribed.

## 2022-02-12 NOTE — ED Provider Notes (Signed)
[facility] EMERGENCY DEPARTMENT Provider Note   CSN: [id number] Arrival date & time: 02/11/22  2153     History  Chief Complaint  Patient presents with   Dysphagia    Terry Mayo is a 66 y.o. male.  Patient is a 66 year old male with past medical history of coronary artery disease with recent MI, CVA, ischemic cardiomyopathy, hypertension, hyperlipidemia.  Patient presenting today with complaints of weakness.  He feels fatigued and has been having more difficulty swallowing today than usual.  He has a swallowing study scheduled for tomorrow for the same reason.  No aggravating or alleviating factors.  The history is provided by the patient.       Home Medications Prior to Admission medications   Medication Sig Start Date End Date Taking? Authorizing Provider  alum & mag hydroxide-simeth (MAALOX PLUS) 400-400-40 MG/5ML suspension Take 15 mLs by mouth every 6 (six) hours as needed for indigestion (pain with swallowing). 12/24/21   Mesner, Corene Cornea, MD  aspirin EC 81 MG tablet Take 1 tablet (81 mg total) by mouth daily. Swallow whole. 12/07/21   Margie Billet, NP  atorvastatin (LIPITOR) 40 MG tablet Take 1 tablet (40 mg total) by mouth at bedtime. 02/10/22   Mallipeddi, Vishnu P, MD  busPIRone (BUSPAR) 7.5 MG tablet Take 1 tablet (7.5 mg total) by mouth 2 (two) times daily. 01/01/22   Gerlene Fee, NP  cephALEXin (KEFLEX) 500 MG capsule Take 1 capsule (500 mg total) by mouth 3 (three) times daily. 01/22/22   Varney Biles, MD  clopidogrel (PLAVIX) 75 MG tablet Take 1 tablet (75 mg total) by mouth daily with breakfast. 01/01/22   Gerlene Fee, NP  hydrOXYzine (VISTARIL) 25 MG capsule Take 25 mg by mouth 2 (two) times daily. 01/29/22   [provider]  isosorbide dinitrate (ISORDIL) 20 MG tablet Take 1 tablet (20 mg total) by mouth 2 (two) times daily. 01/01/22   Gerlene Fee, NP  losartan (COZAAR) 25 MG tablet Take 1 tablet (25 mg total) by mouth daily. 01/01/22   Gerlene Fee, NP  metoprolol tartrate (LOPRESSOR) 25 MG tablet Take 0.5 tablets (12.5 mg total) by mouth 2 (two) times daily. 01/01/22   Gerlene Fee, NP  nitroGLYCERIN (NITROSTAT) 0.4 MG SL tablet Place 1 tablet (0.4 mg total) under the tongue every 5 (five) minutes x 3 doses as needed for chest pain. 01/01/22   Gerlene Fee, NP  omeprazole (PRILOSEC) 20 MG capsule Take 1 capsule (20 mg total) by mouth daily. 01/20/22   Leath-Warren, Alda Lea, NP  ondansetron (ZOFRAN-ODT) 4 MG disintegrating tablet Take 1 tablet (4 mg total) by mouth every 8 (eight) hours as needed for nausea or vomiting. 01/20/22   Leath-Warren, Alda Lea, NP  pantoprazole sodium (PROTONIX) 40 mg Take 40 mg by mouth daily. 01/01/22   Gerlene Fee, NP  spironolactone (ALDACTONE) 25 MG tablet Take 0.5 tablets (12.5 mg total) by mouth daily. 01/01/22   Gerlene Fee, NP  traZODone (DESYREL) 50 MG tablet Take 50 mg by mouth at bedtime as needed for sleep. 01/08/22   [provider]      Allergies    Penicillins    Review of Systems   Review of Systems  All other systems reviewed and are negative.   Physical Exam Updated Vital Signs BP 107/71   Pulse (!) 59   Temp 98.1 F (36.7 C) (Oral)   Resp 18   Ht 6\' 2"  (1.88 m)  Wt 76 kg   SpO2 100%   BMI 21.51 kg/m  Physical Exam Vitals and nursing note reviewed.  Constitutional:      General: He is not in acute distress.    Appearance: He is well-developed. He is not diaphoretic.  HENT:     Head: Normocephalic and atraumatic.  Cardiovascular:     Rate and Rhythm: Normal rate and regular rhythm.     Heart sounds: No murmur heard.    No friction rub.  Pulmonary:     Effort: Pulmonary effort is normal. No respiratory distress.     Breath sounds: Normal breath sounds. No wheezing or rales.  Abdominal:     General: Bowel sounds are normal. There is no distension.     Palpations: Abdomen is soft.     Tenderness: There is no abdominal tenderness.   Musculoskeletal:        General: Normal range of motion.     Cervical back: Normal range of motion and neck supple.  Skin:    General: Skin is warm and dry.  Neurological:     Mental Status: He is alert and oriented to person, place, and time.     Coordination: Coordination normal.     ED Results / Procedures / Treatments   Labs (all labs ordered are listed, but only abnormal results are displayed) Labs Reviewed - No data to display  EKG None  Radiology No results found.  Procedures Procedures    Medications Ordered in ED Medications - No data to display  ED Course/ Medical Decision Making/ A&P  Patient presenting with complaints of weakness and difficulty swallowing.  This seems to be a problem that has been ongoing since his stroke, and has an appointment tomorrow for a swallow study.  Wife was concerned may be something more was going on this evening and encouraged him to present here.  On arrival, vitals are stable and patient is afebrile.  Physical examination basically unremarkable.  Work-up initiated including EKG and laboratory studies, none of which indicate a cardiac etiology or other acute process.  I feel as though patient can safely be discharged for his appointment tomorrow.  Final Clinical Impression(s) / ED Diagnoses Final diagnoses:  None    Rx / DC Orders ED Discharge Orders     None         Veryl Speak, MD 02/12/22 [id number]

## 2022-02-12 NOTE — Therapy (Signed)
[address], [place], [address] Phone: [phone]   Fax:  [phone]  Modified Barium Swallow  Patient Details  Name: Terry Mayo MRN: [id number] Date of Birth: 12-Jan-1956 No data recorded  Encounter Date: 02/12/2022   End of Session - 02/12/22 1331     Visit Number 1    Number of Visits 1    Authorization Type [employer]    SLP Start Time 1125    SLP Stop Time  1210    SLP Time Calculation (min) 45 min    Activity Tolerance Patient tolerated treatment well             Past Medical History:  Diagnosis Date   Arthritis    Essential hypertension    Hyperlipidemia    Kidney stones     Past Surgical History:  Procedure Laterality Date   CHOLECYSTECTOMY  1990s   COLONOSCOPY WITH PROPOFOL N/A 09/30/2015   Procedure: COLONOSCOPY WITH PROPOFOL;  Surgeon: Terry Mayo;  [facility];  Service: Endoscopy;  Laterality: N/A;  930   IR CT HEAD LTD  12/10/2021   IR PERCUTANEOUS ART THROMBECTOMY/INFUSION INTRACRANIAL INC DIAG ANGIO  12/10/2021   IR US GUIDE VASC ACCESS RIGHT  12/10/2021   KIDNEY STONE SURGERY  2005   LEFT HEART CATH AND CORONARY ANGIOGRAPHY N/A 12/04/2021   Procedure: LEFT HEART CATH AND CORONARY ANGIOGRAPHY;  Surgeon: Terry Mayo;  Location: [facility];  Service: Cardiovascular;  Laterality: N/A;   LOOP RECORDER INSERTION N/A 12/16/2021   Procedure: LOOP RECORDER INSERTION;  Surgeon: Terry Mayo;  Location: [facility];  Service: Cardiovascular;  Laterality: N/A;   RADIOLOGY WITH ANESTHESIA N/A 12/10/2021   Procedure: IR WITH ANESTHESIA;  Surgeon: Terry Mayo;  Location: [facility];  Service: Radiology;  Laterality: N/A;    There were no vitals filed for this visit.        General - 02/12/22 1323       General Information   Date of Onset 02/04/22    HPI Pt is a 66 yo male presenting for OP MBS after referral from Terry Mayo.  He was admitted 8/15 for L MCA CVA with MBS 8/17 revealing a mild-moderate oropharyngeal dysphagia with significantly delayed mastication, oral residue, and delays in swallow initiation although no penetration or aspiration. Dys 1 diet and honey thick liquids recommended with advancement to nectar thick liquids prior to discharge to SNF. Pt is now home from SNF and had a repeat MBSS 01/09/2022 with recommendation for D3 and thin. Pt reports "worry" about swallowing. He presented to the ED last night due to concerns about swallowing. PMH also includes: HTN, HLD, former smoker, NSTEMI    Type of Study MBS-Modified Barium Swallow Study    Previous Swallow Assessment last MBSS 01/09/2022 with recommendation for D3 and thin    Diet Prior to this Study Dysphagia 3 (soft);Thin liquids    Temperature Spikes Noted No    Respiratory Status Room air    History of Recent Intubation No    Behavior/Cognition Alert;Cooperative;Pleasant mood    Oral Cavity Assessment Within Functional Limits    Oral Care Completed by SLP No    Oral Cavity - Dentition Poor condition;Missing dentition    Vision Functional for self feeding    Self-Feeding Abilities Able to feed self    Patient Positioning Upright in chair    Baseline Vocal Quality  Normal   mild dysphonia/breathy quality   Volitional Cough Strong    Volitional Swallow Able to elicit    Anatomy Within functional limits    Pharyngeal Secretions Not observed secondary MBS                Oral Preparation/Oral Phase - 02/12/22 1327       Oral Preparation/Oral Phase   Oral Phase Impaired      Oral - Solids   Oral - Regular Delayed A-P transit;Piecemeal swallowing    Oral - Multi-consistency Right anterior bolus loss   labial spillage of thins when taking sequential cup sips to take the pill     Electrical stimulation - Oral Phase   Was Electrical Stimulation Used No              Pharyngeal Phase - 02/12/22 1328       Pharyngeal Phase    Pharyngeal Phase Impaired      Pharyngeal - Thin   Pharyngeal- Thin Cup Swallow initiation at vallecula;Pharyngeal residue - pyriform    Pharyngeal- Thin Straw Swallow initiation at pyriform sinus;Pharyngeal residue - pyriform;Pharyngeal residue - valleculae      Electrical Stimulation - Pharyngeal Phase   Was Electrical Stimulation Used No              Cricopharyngeal Phase - 02/12/22 1330       Cervical Esophageal Phase   Cervical Esophageal Phase Within functional limits              Plan - 02/12/22 1331     Clinical Impression Statement Pt presents with min oropharyngeal phase dysphagia characterized by limited dentition with impaired mastication resulting in prolonged oral transit and slight piecemeal deglutition and lingual residue and min delay in swallow initiation with contrast reaching the pyriforms with straw sips thin. Pt with adequate hyolaryngeal excursion and epiglottic deflection. No penetration or aspiration observed despite challenging Pt with sequential cup and straw sips. Pt with min lingual residuals of liquids which pool in the valleculae and pyriforms in trace amounts, but are spontaneously cleared with a second swallow. Pt with increased vallecular and pyriform pooling after the swallow when taking sequential straw sips, however he did clear with a dry swallow. Pt with one episode of righ labial spillage of sequential cup sips thin when taking barium tablet (Pt tipped head back and continuously drank from cup). Esophageal sweep was unremarkable. Pt's swallow is essentially close to WNL, however Pt is limited by fear. SLP spoke with Pt and wife at length. Imaging from the MBSS was reviewed and Pt recorded a video on his phone so that he can watch at home when he becomes worried. Recommend regular textures and thin liquids and Pt to swallow 2x for each sip of liquid to ensure cleared pharynx (he tends to do this spontaneously anyway). No further SLP services  indicated at this time.             Patient will benefit from skilled therapeutic intervention in order to improve the following deficits and impairments:   Dysphagia, oropharyngeal phase     Recommendations/Treatment - 02/12/22 1330       Swallow Evaluation Recommendations   SLP Diet Recommendations Thin;Age appropriate regular    Liquid Administration via Cup;Straw    Medication Administration Whole meds with liquid    Supervision Patient able to self feed    Compensations Slow rate;Multiple dry swallows after each bite/sip    Postural Changes Seated upright at 90 degrees;Remain  upright for at least 30 minutes after feeds/meals               Problem List Patient Active Problem List   Diagnosis Date Noted   Esophagitis 12/26/2021   Right hemiplegia ([place]) 12/17/2021   History of ischemic left ICA stroke 12/10/2021   Acute left ICA ischemic stroke ([place]) 12/10/2021   CAD (coronary artery disease) 12/05/2021   Ischemic cardiomyopathy 12/05/2021   NSVT (nonsustained ventricular tachycardia) ([place]) 12/05/2021   Hypertension 12/05/2021   Hyperlipidemia 12/05/2021   NSTEMI (non-ST elevated myocardial infarction) ([place]) 12/04/2021   Long term (current) use of antithrombotics/antiplatelets    Loss of weight 09/04/2015   Encounter for screening colonoscopy 09/04/2015   Thank you,  Genene Churn, [place]  [place], [place] 02/12/2022, 1:40 PM  [facility] [place] [address] [place], [place], [address] Phone: [phone]   Fax:  [phone]  Name: LYN WITHEM MRN: [id number] Date of Birth: 29-May-1955

## 2022-02-13 ENCOUNTER — Ambulatory Visit (HOSPITAL_COMMUNITY)
Admission: RE | Admit: 2022-02-13 | Discharge: 2022-02-13 | Disposition: A | Discharge status: Home / Self Care | Payer: Medicare HMO | Source: Ambulatory Visit | Attending: Internal Medicine | Admitting: Internal Medicine

## 2022-02-13 DIAGNOSIS — I513 Intracardiac thrombosis, not elsewhere classified: Secondary | ICD-10-CM | POA: Insufficient documentation

## 2022-02-13 MED ORDER — PERFLUTREN LIPID MICROSPHERE
1.0000 mL | INTRAVENOUS | Status: AC | PRN
  Administered 2022-02-13: 3 mL via INTRAVENOUS

## 2022-02-13 NOTE — Progress Notes (Signed)
*  PRELIMINARY RESULTS* Echocardiogram Limited 2-D Echocardiogram  has been performed with Definity.  Terry Mayo 02/13/2022, 12:35 PM

## 2022-02-16 LAB — ECHOCARDIOGRAM LIMITED: S' Lateral: 3.3 cm

## 2022-02-17 ENCOUNTER — Telehealth: Payer: Self-pay

## 2022-02-17 DIAGNOSIS — Z79899 Other long term (current) drug therapy: Secondary | ICD-10-CM

## 2022-02-17 NOTE — Telephone Encounter (Signed)
-----   Message from Chalmers Guest, MD sent at 02/17/2022  9:01 AM EDT ----- Patient has serial elevation of liver enzymes (ALT>AST) likely secondary to statin use. Statin needs to come off. He will need PCSK9 inhibitors to keep LDL<55 goal. Pharm D referral for PCSK9 inhibitors and stop statin.

## 2022-02-17 NOTE — Telephone Encounter (Signed)
Patient and patient wife notified and verbalized understanding. Referral sent to Pharm D and medication list updated to reflect changes made

## 2022-02-18 ENCOUNTER — Telehealth: Payer: Self-pay

## 2022-02-18 NOTE — Telephone Encounter (Signed)
-----   Message from Chalmers Guest, MD sent at 02/17/2022  8:28 AM EDT ----- Limited Echo with contrast showed low normal heart function and no evidence of LV thrombus.

## 2022-02-18 NOTE — Telephone Encounter (Signed)
Wife notified (ok per DPR) and verbalized understanding.

## 2022-02-23 ENCOUNTER — Ambulatory Visit (HOSPITAL_COMMUNITY): Payer: Medicare HMO | Attending: Emergency Medicine

## 2022-02-23 DIAGNOSIS — I63232 Cerebral infarction due to unspecified occlusion or stenosis of left carotid arteries: Secondary | ICD-10-CM | POA: Diagnosis not present

## 2022-02-24 LAB — CUP PACEART REMOTE DEVICE CHECK
Date Time Interrogation Session: 20231027175126
Implantable Pulse Generator Implant Date: 20230822

## 2022-03-04 ENCOUNTER — Ambulatory Visit: Payer: Medicare HMO | Admitting: Internal Medicine

## 2022-03-11 ENCOUNTER — Ambulatory Visit: Payer: Medicare HMO | Attending: Internal Medicine | Admitting: Internal Medicine

## 2022-03-11 ENCOUNTER — Encounter: Payer: Self-pay | Admitting: Internal Medicine

## 2022-03-11 VITALS — BP 102/72 | HR 66 | Ht 74.0 in | Wt 172.2 lb

## 2022-03-11 DIAGNOSIS — E782 Mixed hyperlipidemia: Secondary | ICD-10-CM

## 2022-03-11 NOTE — Progress Notes (Signed)
Cardiology Office Note  Date: 03/11/2022   ID: FITZROY MIKAMI, DOB 1956/04/01, MRN [id number]  PCP:  [facility]  Cardiologist:  Marjo Bicker, MD Electrophysiologist:  None   Reason for Office Visit: Post hospitalization follow-up  History of Present Illness: Terry Mayo is a 66 y.o. male known to have CAD manifested by NSTEMI on 12/04/21 s/p multivessel CAD (pLAD total occlusion, dLCx 60% and mRCA 70%) with LVEF 45-50%, L MCA CVA s/p tNK and thrombectomy with no hemorrhagic transformation s/p Loop recorder placement for Afib surveillance, HTN, HLD presented to the cardiology clinic for follow-up visit.  Patient accompanied by wife. Denied any angina, DOE, dizziness, lightness, syncope, claudication and LE swelling. Denies smoking cigarettes ever.  Compliant with medications and no side effects. I stopped his statin due to elevated AST and ALT and placed a referral to Pharm.D. He is awaiting call from Pharm.D. Patient currently working with physical therapy to regain his motor strength.  Past Medical History:  Diagnosis Date   Arthritis    Essential hypertension    Hyperlipidemia    Kidney stones     Past Surgical History:  Procedure Laterality Date   CHOLECYSTECTOMY  1990s   COLONOSCOPY WITH PROPOFOL N/A 09/30/2015   Procedure: COLONOSCOPY WITH PROPOFOL;  Surgeon: Corbin Ade, MD;  [facility];  Service: Endoscopy;  Laterality: N/A;  930   IR CT HEAD LTD  12/10/2021   IR PERCUTANEOUS ART THROMBECTOMY/INFUSION INTRACRANIAL INC DIAG ANGIO  12/10/2021   IR US GUIDE VASC ACCESS RIGHT  12/10/2021   KIDNEY STONE SURGERY  2005   LEFT HEART CATH AND CORONARY ANGIOGRAPHY N/A 12/04/2021   Procedure: LEFT HEART CATH AND CORONARY ANGIOGRAPHY;  Surgeon: Lyn Records, MD;  [facility];  Service: Cardiovascular;  Laterality: N/A;   LOOP RECORDER INSERTION N/A 12/16/2021   Procedure: LOOP RECORDER INSERTION;  Surgeon: Regan Lemming, MD;  [facility];  Service: Cardiovascular;  Laterality: N/A;   RADIOLOGY WITH ANESTHESIA N/A 12/10/2021   Procedure: IR WITH ANESTHESIA;  Surgeon: Julieanne Cotton, MD;  Location: [facility];  Service: Radiology;  Laterality: N/A;    Current Outpatient Medications  Medication Sig Dispense Refill   alum & mag hydroxide-simeth (MAALOX PLUS) 400-400-40 MG/5ML suspension Take 15 mLs by mouth every 6 (six) hours as needed for indigestion (pain with swallowing). 355 mL 0   aspirin EC 81 MG tablet Take 1 tablet (81 mg total) by mouth daily. Swallow whole. 30 tablet 3   busPIRone (BUSPAR) 7.5 MG tablet Take 1 tablet (7.5 mg total) by mouth 2 (two) times daily. 60 tablet 0   cephALEXin (KEFLEX) 500 MG capsule Take 1 capsule (500 mg total) by mouth 3 (three) times daily. 30 capsule 0   clopidogrel (PLAVIX) 75 MG tablet Take 1 tablet (75 mg total) by mouth daily with breakfast. 30 tablet 0   hydrOXYzine (VISTARIL) 25 MG capsule Take 25 mg by mouth 2 (two) times daily.     isosorbide dinitrate (ISORDIL) 20 MG tablet Take 1 tablet (20 mg total) by mouth 2 (two) times daily. 60 tablet 0   losartan (COZAAR) 25 MG tablet Take 1 tablet (25 mg total) by mouth daily. 30 tablet 0   metoprolol tartrate (LOPRESSOR) 25 MG tablet Take 0.5 tablets (12.5 mg total) by mouth 2 (two) times daily. 60 tablet 0   nitroGLYCERIN (NITROSTAT) 0.4 MG SL tablet Place 1 tablet (0.4 mg total)  under the tongue every 5 (five) minutes x 3 doses as needed for chest pain. 20 tablet 0   omeprazole (PRILOSEC OTC) 20 MG tablet Take 20 mg by mouth as needed.     pantoprazole sodium (PROTONIX) 40 mg Take 40 mg by mouth daily. (Patient taking differently: Take 40 mg by mouth as needed.) 30 each 0   spironolactone (ALDACTONE) 25 MG tablet Take 0.5 tablets (12.5 mg total) by mouth daily. 15 tablet 0   ondansetron (ZOFRAN-ODT) 4 MG disintegrating tablet Take 1 tablet (4 mg total) by mouth every 8 (eight) hours as needed for  nausea or vomiting. (Patient not taking: Reported on 03/11/2022) 20 tablet 0   traZODone (DESYREL) 50 MG tablet Take 50 mg by mouth at bedtime as needed for sleep. (Patient not taking: Reported on 03/11/2022)     No current facility-administered medications for this visit.   Allergies:  Penicillins   Social History: The patient  reports that he quit smoking about 6 years ago. His smoking use included cigarettes. He has been exposed to tobacco smoke. He has never used smokeless tobacco. He reports that he does not currently use alcohol. He reports that he does not use drugs.   Family History: The patient's family history includes Coronary artery disease in his sister.   ROS:  Please see the history of present illness. Otherwise, complete review of systems is positive for none.  All other systems are reviewed and negative.   Physical Exam: VS:  BP 102/72 (BP Location: Left Arm, Patient Position: Sitting, Cuff Size: Normal)   Pulse 66   Ht 6\' 2"  (1.88 m)   Wt 172 lb 3.2 oz (78.1 kg)   SpO2 96%   BMI 22.11 kg/m , BMI Body mass index is 22.11 kg/m.  Wt Readings from Last 3 Encounters:  03/11/22 172 lb 3.2 oz (78.1 kg)  02/11/22 167 lb 8.8 oz (76 kg)  02/10/22 168 lb (76.2 kg)    General: Patient appears comfortable at rest. HEENT: Conjunctiva and lids normal, oropharynx clear with moist mucosa. Neck: Supple, no elevated JVP or carotid bruits, no thyromegaly. Lungs: Clear to auscultation, nonlabored breathing at rest. Cardiac: Regular rate and rhythm, no S3 or significant systolic murmur, no pericardial rub. Abdomen: Soft, nontender, no hepatomegaly, bowel sounds present, no guarding or rebound. Extremities: No pitting edema, distal pulses 2+. Skin: Warm and dry. Musculoskeletal: No kyphosis. Neuropsychiatric: Alert and oriented x3, RUE and RLE weakness and decreased sensation  ECG:  An ECG dated recently in 09/23 was personally reviewed today and demonstrated:  NSR and no ST-T  changes  Recent Labwork: 12/04/2021: B Natriuretic Peptide 48.0 12/05/2021: Magnesium 1.9 02/10/2022: ALT 193; AST 109 02/12/2022: BUN 15; Creatinine, Ser 1.05; Hemoglobin 13.8; Platelets 183; Potassium 4.0; Sodium 137     Component Value Date/Time   CHOL 99 12/05/2021 0317   TRIG 58 12/11/2021 0612   HDL 50 12/05/2021 0317   CHOLHDL 2.0 12/05/2021 0317   VLDL 8 12/05/2021 0317   LDLCALC 41 12/05/2021 0317    Other Studies Reviewed Today: Echo on 12/05/21 False tendon in the apical LV of no clinical significance. Left ventricular ejection fraction, by estimation, is 45 to 50%. The left ventricle has mildly decreased function. The left ventricle demonstrates regional wall motion abnormalities (see scoring diagram/findings for description). Left ventricular diastolic parameters are indeterminate. There is akinesis of the left ventricular, apical septal wall and anterior wall. There is hypokinesis of the left ventricular, mid anterior wall. There is akinesis  of the left ventricular, apical segment. There is akinesis of the left ventricular, mid anteroseptal wall. Right ventricular systolic function is normal. The right ventricular size is normal. A Prominent moderator band is visualized. There is normal pulmonary artery systolic pressure. The estimated right ventricular systolic pressure is 33.4 mmHg. The mitral valve is normal in structure. Trivial mitral valve regurgitation. No evidence of mitral stenosis. The aortic valve is normal in structure. Aortic valve regurgitation is not visualized. No aortic stenosis is present. The inferior vena cava is dilated in size with >50% respiratory variability, suggesting right atrial pressure of 8 mmHg.  LHC on 12/04/21 CONCLUSIONS: The left coronary anatomy is ambiguous because of multiple diagonals as ramus light branches.  But we can never find the stump. There are faint hint of thrombus distal or around the origin of the large septal  perforator. 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 Left main is widely patent Circumflex gives origin to 2 large obtuse marginal branches that arise from a common stent.  The more medial branch is larger and has moderate proximal disease up to 60%. Right coronary contains codominant anatomy and moderate mid vessel disease up to 60 to 70% in the mid vessel.  It supplies collaterals around the apex to the LAD. Left ventriculography demonstrates inferoapical and apical moderate hypokinesis.  EF 40 to 50%.  LVEDP upper normal at 17 mmHg.   Recommend: Aspirin and Plavix for at least 6 months. Aggressive lipid-lowering. Nitroglycerin if recurrent chest pain.  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  #CAD manifested by NSTEMI on 12/04/21 s/p multivessel CAD (pLAD total occlusion, dLCx 60% and mRCA 70%) with LVEF 45-50%, currently angina free PLAN -Continue aspirin 81 mg once daily and Plavix and 5 mg once daily.  Continue DAPT for at least 6 months, uninterrupted, total duration of 1 year. Elective procedures need to be deferred until completion of 6 months of DAPT.  Patient voiced understanding. -Statins held due to elevated liver enzymes and patient awaiting call from Pharm.D. for PCSK9 inhibitors initiation. -SL NTG 0.4 mg as needed  #HLD, currently at goal (LDL<70 mg/dL) PLAN -Statin was held due to elevating liver enzymes. Patient awaiting call from pharmacy for PCSK9 inhibitors initiation.  #Ischemic cardiomyopathy, LVEF 45-50%, currently compensated PLAN -Continue metoprolol tartrate 12.5 mg twice daily, losartan  25 mg once daily, spironolactone 12.5 mg once daily, ISDN 20 mg twice daily  # L MCA CVA s/p tNK and thrombectomy with no hemorrhagic transformation s/p Loop recorder placement for Afib surveillance PLAN -No atrial arrhythmias on recent loop interrogation  #HTN, currently controlled PLAN -Continue metoprolol tartrate 12.5 mg twice daily, losartan 25 mg once daily, spironolactone 12.5 mg once daily, ISDN 20 mg twice daily.  I have spent a total of 33 minutes with patient reviewing chart , EKGs, labs and examining patient as well as establishing an assessment and plan that was discussed with the patient.  > 50% of time was spent in direct patient care.      Medication Adjustments/Labs and Tests Ordered: Current medicines are reviewed at length with the patient today.  Concerns regarding medicines are outlined above.  Tests Ordered: No orders of the defined types were placed in this encounter.   Medication Changes: No orders of the defined types were placed in this encounter.   Disposition:  Follow up  10 months  Signed, Aubree Doody Verne Spurr, MD, 03/11/2022 1:13 PM     [facility]

## 2022-03-11 NOTE — Patient Instructions (Addendum)
Medication Instructions:  Your physician recommends that you continue on your current medications as directed. Please refer to the Current Medication list given to you today.  Labwork: none  Testing/Procedures: none  Follow-Up: Your physician recommends that you schedule a follow-up appointment in: 10 months  Any Other Special Instructions Will Be Listed Below (If Applicable).  If you need a refill on your cardiac medications before your next appointment, please call your pharmacy.

## 2022-03-13 ENCOUNTER — Telehealth: Payer: Self-pay | Admitting: Internal Medicine

## 2022-03-13 NOTE — Telephone Encounter (Signed)
Spoke with sig other (Drucilla Siddle) - states that she has not spoken to the pharmacist yet.  Explained to her that this type of injectable would be self administered & very doubtful that this is something that the pcp would administer in office.  Suggested that she wait till they actually have conversation with pharmD before making any decisions.  She verbalized understanding.

## 2022-03-13 NOTE — Telephone Encounter (Signed)
Caller stated the patient will have his primary doctor administer his injectable medication.

## 2022-03-25 ENCOUNTER — Other Ambulatory Visit: Payer: Self-pay

## 2022-03-25 NOTE — Patient Outreach (Signed)
First telephone outreach attempt to obtain mRS. No answer. Left message for returned call.  Doristine Shehan [facility]-Care Management Assistant [phone]  

## 2022-03-27 ENCOUNTER — Other Ambulatory Visit: Payer: Self-pay

## 2022-03-27 NOTE — Patient Outreach (Signed)
Telephone outreach to patient to obtain mRS was successfully completed. MRS= 3  Anne-Marie Genson [facility] Care Management Assistant [phone]  

## 2022-03-28 NOTE — Progress Notes (Signed)
[facility] Summary Report / Loop Recorder 

## 2022-03-30 ENCOUNTER — Ambulatory Visit (HOSPITAL_COMMUNITY): Payer: Medicare HMO | Attending: Emergency Medicine

## 2022-03-30 DIAGNOSIS — I63232 Cerebral infarction due to unspecified occlusion or stenosis of left carotid arteries: Secondary | ICD-10-CM | POA: Insufficient documentation

## 2022-03-30 LAB — CUP PACEART REMOTE DEVICE CHECK
Date Time Interrogation Session: 20231203231824
Implantable Pulse Generator Implant Date: 20230822

## 2022-04-04 ENCOUNTER — Emergency Department (HOSPITAL_COMMUNITY)
Admission: EM | Admit: 2022-04-04 | Discharge: 2022-04-04 | Disposition: A | Discharge status: Home / Self Care | Payer: Medicare HMO | Attending: Emergency Medicine | Admitting: Emergency Medicine

## 2022-04-04 ENCOUNTER — Encounter (HOSPITAL_COMMUNITY): Payer: Self-pay | Admitting: *Deleted

## 2022-04-04 ENCOUNTER — Other Ambulatory Visit: Payer: Self-pay

## 2022-04-04 ENCOUNTER — Emergency Department (HOSPITAL_COMMUNITY): Payer: Medicare HMO

## 2022-04-04 DIAGNOSIS — Z7982 Long term (current) use of aspirin: Secondary | ICD-10-CM | POA: Insufficient documentation

## 2022-04-04 DIAGNOSIS — Z79899 Other long term (current) drug therapy: Secondary | ICD-10-CM | POA: Insufficient documentation

## 2022-04-04 DIAGNOSIS — N200 Calculus of kidney: Secondary | ICD-10-CM | POA: Insufficient documentation

## 2022-04-04 DIAGNOSIS — R319 Hematuria, unspecified: Secondary | ICD-10-CM | POA: Diagnosis present

## 2022-04-04 DIAGNOSIS — I10 Essential (primary) hypertension: Secondary | ICD-10-CM | POA: Diagnosis not present

## 2022-04-04 HISTORY — DX: Cerebral infarction, unspecified: I63.9

## 2022-04-04 HISTORY — DX: Acute myocardial infarction, unspecified: I21.9

## 2022-04-04 LAB — URINALYSIS, ROUTINE W REFLEX MICROSCOPIC
Bacteria, UA: NONE SEEN
Bilirubin Urine: NEGATIVE
Glucose, UA: NEGATIVE mg/dL
Ketones, ur: NEGATIVE mg/dL
Leukocytes,Ua: NEGATIVE
Nitrite: NEGATIVE
Protein, ur: 100 mg/dL — AB
RBC / HPF: >50 RBC/hpf — ABNORMAL HIGH (ref 0–5)
Specific Gravity, Urine: 1.014 (ref 1.005–1.030)
pH: 6 (ref 5.0–8.0)

## 2022-04-04 LAB — CBC WITH DIFFERENTIAL/PLATELET
Abs Immature Granulocytes: 0.01 10*3/uL (ref 0.00–0.07)
Basophils Absolute: 0.1 10*3/uL (ref 0.0–0.1)
Basophils Relative: 1 %
Eosinophils Absolute: 0.7 10*3/uL — ABNORMAL HIGH (ref 0.0–0.5)
Eosinophils Relative: 9 %
HCT: 40.7 % (ref 39.0–52.0)
Hemoglobin: 13.9 g/dL (ref 13.0–17.0)
Immature Granulocytes: 0 %
Lymphocytes Relative: 49 %
Lymphs Abs: 4 10*3/uL (ref 0.7–4.0)
MCH: 31.7 pg (ref 26.0–34.0)
MCHC: 34.2 g/dL (ref 30.0–36.0)
MCV: 92.7 fL (ref 80.0–100.0)
Monocytes Absolute: 0.6 10*3/uL (ref 0.1–1.0)
Monocytes Relative: 8 %
Neutro Abs: 2.6 10*3/uL (ref 1.7–7.7)
Neutrophils Relative %: 33 %
Platelets: 207 10*3/uL (ref 150–400)
RBC: 4.39 MIL/uL (ref 4.22–5.81)
RDW: 13.5 % (ref 11.5–15.5)
WBC: 8 10*3/uL (ref 4.0–10.5)
nRBC: 0 % (ref 0.0–0.2)

## 2022-04-04 LAB — BASIC METABOLIC PANEL
Anion gap: 7 (ref 5–15)
BUN: 17 mg/dL (ref 8–23)
CO2: 27 mmol/L (ref 22–32)
Calcium: 9.1 mg/dL (ref 8.9–10.3)
Chloride: 105 mmol/L (ref 98–111)
Creatinine, Ser: 1.37 mg/dL — ABNORMAL HIGH (ref 0.61–1.24)
GFR, Estimated: 57 mL/min — ABNORMAL LOW (ref >60)
Glucose, Bld: 92 mg/dL (ref 70–99)
Potassium: 3.9 mmol/L (ref 3.5–5.1)
Sodium: 139 mmol/L (ref 135–145)

## 2022-04-04 MED ORDER — IOHEXOL 300 MG/ML  SOLN
100.0000 mL | Freq: Once | INTRAMUSCULAR | Status: AC | PRN
  Administered 2022-04-04: 100 mL via INTRAVENOUS

## 2022-04-04 MED ORDER — SODIUM CHLORIDE 0.9 % IV BOLUS
1000.0000 mL | Freq: Once | INTRAVENOUS | Status: AC
  Administered 2022-04-04: 1000 mL via INTRAVENOUS

## 2022-04-04 NOTE — Discharge Instructions (Addendum)
You came to the emergency department today due to blood in your urine.  This is also called hematuria.  Please read the information about this attached to the discharge papers.  You did not have any signs of infection today.  Your kidney function is very slightly decreased.  This may be secondary to dehydration so your best to increase your fluid intake.  See your primary care doctor and discussed this, give a repeat urine sample and potentially have your kidney function rechecked.  Your CT scan showed multiple kidney stones in both of your kidneys.  They all look normal and are not reason for concern.  It is always possible that he recently passed a stone that is resulting in the blood in your urine.  This is something I want you to speak to a urologist (urinary and prostate doctor) about.  Do not hesitate to return to the emergency department with any worsening symptoms.  It was a pleasure to meet you and we hope you feel better!

## 2022-04-04 NOTE — ED Triage Notes (Signed)
Pt with blood urine today, hx of same due kidney infection per visitor with pt. Denies any pain at present, denies any burning with urination. Pt is on Plavix.

## 2022-04-04 NOTE — ED Provider Notes (Signed)
[facility] EMERGENCY DEPARTMENT Provider Note   CSN: [id number] Arrival date & time: 04/04/22  1232     History  Chief Complaint  Patient presents with   Hematuria    Terry Mayo is a 66 y.o. male with a history of hypertension, hyperlipidemia, left ICA stroke and NSTEMI in August presenting today with hematuria.  He reports that today he had 1 episode of hematuria.  Says that the beginning of his urine stream was red however after the first few seconds his urine was normal colored.  No abdominal pain, back pain, fever, chills.  No dysuria.  He is on Plavix since August due to his CVA/NSTEMI.  He had a catheter at that time that has since been removed a couple months ago.   Hematuria       Home Medications Prior to Admission medications   Medication Sig Start Date End Date Taking? Authorizing Provider  alum & mag hydroxide-simeth (MAALOX PLUS) 400-400-40 MG/5ML suspension Take 15 mLs by mouth every 6 (six) hours as needed for indigestion (pain with swallowing). 12/24/21   Mesner, Barbara Cower, MD  aspirin EC 81 MG tablet Take 1 tablet (81 mg total) by mouth daily. Swallow whole. 12/07/21   Cyndi Bender, NP  busPIRone (BUSPAR) 7.5 MG tablet Take 1 tablet (7.5 mg total) by mouth 2 (two) times daily. 01/01/22   Sharee Holster, NP  cephALEXin (KEFLEX) 500 MG capsule Take 1 capsule (500 mg total) by mouth 3 (three) times daily. 01/22/22   Derwood Kaplan, MD  clopidogrel (PLAVIX) 75 MG tablet Take 1 tablet (75 mg total) by mouth daily with breakfast. 01/01/22   Sharee Holster, NP  hydrOXYzine (VISTARIL) 25 MG capsule Take 25 mg by mouth 2 (two) times daily. 01/29/22   [provider]  isosorbide dinitrate (ISORDIL) 20 MG tablet Take 1 tablet (20 mg total) by mouth 2 (two) times daily. 01/01/22   Sharee Holster, NP  losartan (COZAAR) 25 MG tablet Take 1 tablet (25 mg total) by mouth daily. 01/01/22   Sharee Holster, NP  metoprolol tartrate (LOPRESSOR) 25 MG tablet Take 0.5 tablets (12.5 mg  total) by mouth 2 (two) times daily. 01/01/22   Sharee Holster, NP  nitroGLYCERIN (NITROSTAT) 0.4 MG SL tablet Place 1 tablet (0.4 mg total) under the tongue every 5 (five) minutes x 3 doses as needed for chest pain. 01/01/22   Sharee Holster, NP  omeprazole (PRILOSEC OTC) 20 MG tablet Take 20 mg by mouth as needed.    [provider]  pantoprazole sodium (PROTONIX) 40 mg Take 40 mg by mouth daily. Patient taking differently: Take 40 mg by mouth as needed. 01/01/22   Sharee Holster, NP  spironolactone (ALDACTONE) 25 MG tablet Take 0.5 tablets (12.5 mg total) by mouth daily. 01/01/22   Sharee Holster, NP  traZODone (DESYREL) 50 MG tablet Take 50 mg by mouth at bedtime as needed for sleep. Patient not taking: Reported on 03/11/2022 01/08/22   [provider]      Allergies    Penicillins    Review of Systems   Review of Systems  Genitourinary:  Positive for hematuria.    Physical Exam Updated Vital Signs BP 136/80 (BP Location: Left Arm)   Pulse (!) 59   Temp 98.8 F (37.1 C) (Oral)   Resp 17   Ht 6\' 2"  (1.88 m)   Wt 78.5 kg   SpO2 100%   BMI 22.21 kg/m  Physical Exam Vitals  and nursing note reviewed.  Constitutional:      General: He is not in acute distress.    Appearance: Normal appearance. He is not ill-appearing.  HENT:     Head: Normocephalic and atraumatic.  Eyes:     General: No scleral icterus.    Conjunctiva/sclera: Conjunctivae normal.  Pulmonary:     Effort: Pulmonary effort is normal. No respiratory distress.  Abdominal:     General: Abdomen is flat.     Palpations: Abdomen is soft.     Tenderness: There is no abdominal tenderness. There is no right CVA tenderness or left CVA tenderness.  Skin:    General: Skin is warm and dry.     Findings: No rash.  Neurological:     Mental Status: He is alert.     Comments: Able to provide his history  Psychiatric:        Mood and Affect: Mood normal.     ED Results / Procedures / Treatments    Labs (all labs ordered are listed, but only abnormal results are displayed) Labs Reviewed  CBC WITH DIFFERENTIAL/PLATELET - Abnormal; Notable for the following components:      Result Value   Eosinophils Absolute 0.7 (*)    All other components within normal limits  BASIC METABOLIC PANEL - Abnormal; Notable for the following components:   Creatinine, Ser 1.37 (*)    GFR, Estimated 57 (*)    All other components within normal limits  URINALYSIS, ROUTINE W REFLEX MICROSCOPIC - Abnormal; Notable for the following components:   Color, Urine AMBER (*)    APPearance CLOUDY (*)    Hgb urine dipstick LARGE (*)    Protein, ur 100 (*)    RBC / HPF >50 (*)    All other components within normal limits    EKG None  Radiology CT ABDOMEN PELVIS W CONTRAST  Result Date: 04/04/2022 CLINICAL DATA:  Gross hematuria EXAM: CT ABDOMEN AND PELVIS WITH CONTRAST TECHNIQUE: Multidetector CT imaging of the abdomen and pelvis was performed using the standard protocol following bolus administration of intravenous contrast. RADIATION DOSE REDUCTION: This exam was performed according to the departmental dose-optimization program which includes automated exposure control, adjustment of the mA and/or kV according to patient size and/or use of iterative reconstruction technique. CONTRAST:  OMNIPAQUE IOHEXOL 300 MG/ML  SOLN COMPARISON:  01/22/2022 FINDINGS: Lower chest: Mild chronic scarring at the right lung base. No active process. Hepatobiliary: Liver parenchyma is normal. Previous cholecystectomy. No ductal dilatation. Pancreas: Normal Spleen: Normal Adrenals/Urinary Tract: Adrenal glands are normal. Right kidney shows 2 nonobstructing stones in the upper pole and 1 in the lower pole, the largest 4 mm. No hydronephrosis. Few small scars in cysts. Left kidney shows a 2 cm cyst in the upper pole which is unchanged and a 5 cm cyst in the lower pole which is unchanged. There are numerous stones within the lower pole  collecting system, several measuring up to 9-10 mm in size. No evidence of passing stone or hydronephrosis. No stone seen along the course of either ureter. No stone seen in the bladder. 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 The appearance is unchanged since September however, therefore a stone in the prostatic urethra is unlikely. Stomach/Bowel: Stomach and small intestine are normal. Moderate amount of stool and gas in the colon but no evidence acute colon pathology. Vascular/Lymphatic: Aortic atherosclerosis. No aneurysm. IVC is normal. No adenopathy. Reproductive: Normal Other: No free fluid or  air. Musculoskeletal: Mild lumbar degenerative changes. Exostosis of the superior pubic ramus on the right. IMPRESSION: 1. Bilateral renal calculi, more numerous on the left than the right. No evidence of passing stone or hydronephrosis. No stone seen along the course of either ureter or in the bladder. Even intrarenal stone disease can be a cause hematuria. 2. 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 The appearance is unchanged since September however, therefore, a stone in the prostatic urethra is unlikely. 3. Previous cholecystectomy. 4. Aortic atherosclerosis. 5. Moderate amount of stool and gas in the colon but no evidence of acute colon pathology. Aortic Atherosclerosis (ICD10-I70.0). Electronically Signed   By: Nelson Chimes M.D.   On: 04/04/2022 16:38    Procedures Procedures   Medications Ordered in ED Medications - No data to display  ED Course/ Medical Decision Making/ A&P                           Medical Decision Making Amount and/or Complexity of Data Reviewed Labs: ordered. Radiology: ordered.  Risk Prescription drug management.   66 year old male presenting today with hematuria.  Differential includes but is not limited to nephrolithiasis, pyelonephritis, AKI, dietary change, rhabdomyolysis, kidney disease.   This is not exhaustive.  Physical exam: Benign  Workup: Creatinine 1.37, GFR 57.  These were both normal previously.  Urinalysis with gross hematuria.  Imaging: CT abdomen pelvis ordered.  Reviewed and interpreted by me and I agree with radiology  MDM/disposition: 66 year old male presenting today with 1 episode of hematuria.  He has no CVA tenderness or abdominal tenderness.  No dysuria.  I considered ordering a CK however I have a low suspicion for rhabdomyolysis at this time due to patient's lack of other symptoms.  He is afebrile has a normal WBC count and is well-appearing, doubt pyelonephritis or nephrolithiasis.  Kidney function slightly decreased from the past couple of months.  Could be developing CKD or could just be AKI from dehydration and decreased p.o. intake. CT scan with bilateral nephrolithiasis without complications.  It is possible that he recently passed a kidney stone contributing to his hematuria.  This is something he may speak to urology about.  At this time there does not appear to be an emergent condition requiring further intervention.  He may follow-up on his hematuria with his PCP and urology.  He was given IV fluids for his potential AKI and he and his wife are agreeable to discharge with outpatient follow-up.  Discharged home with stable vital signs and benign workup.     Final Clinical Impression(s) / ED Diagnoses Final diagnoses:  Hematuria, unspecified type  Nephrolithiasis    Rx / DC Orders ED Discharge Orders     None      Results and diagnoses were explained to the patient. Return precautions discussed in full. Patient had no additional questions and expressed complete understanding.   This chart was dictated using voice recognition software.  Despite best efforts to proofread,  errors can occur which can change the documentation meaning.    Rhae Hammock, PA-C 04/04/22 [place], Julie, MD 04/05/22 [phone]

## 2022-04-15 NOTE — Therapy (Signed)
OUTPATIENT PHYSICAL THERAPY NEURO EVALUATION   Patient Name: Terry Mayo MRN: [id number] DOB:October 16, 1955, 66 y.o., male Today's Date: 04/17/2022   [facility]P: [facility] REFERRING PROVIDER: Vidal Schwalbe, MD  END OF SESSION:  PT End of Session - 04/17/22 0818     Visit Number 1    Number of Visits 1    Authorization Type [employer] H[employer]    PT Start Time 0818    PT Stop Time 0900    PT Time Calculation (min) 42 min    Activity Tolerance Patient tolerated treatment well    Behavior During Therapy WFL for tasks assessed/performed             Past Medical History:  Diagnosis Date   Arthritis    Essential hypertension    Hyperlipidemia    Kidney stones    MI (myocardial infarction) ([place])    Stroke [facility])    Past Surgical History:  Procedure Laterality Date   CHOLECYSTECTOMY  1990s   COLONOSCOPY WITH PROPOFOL N/A 09/30/2015   Procedure: COLONOSCOPY WITH PROPOFOL;  Surgeon: Daneil Dolin, MD;  Location[facility] ENDO [facility];  Service: Endoscopy;  Laterality: N/A;  930   IR CT HEAD LTD  12/10/2021   IR PERCUTANEOUS ART THROMBECTOMY/INFUSION INTRACRANIAL INC DIAG ANGIO  12/10/2021   IR US GUIDE VASC ACCESS RIGHT  12/10/2021   KIDNEY STONE SURGERY  2005   LEFT HEART CATH AND CORONARY ANGIOGRAPHY N/A 12/04/2021   Procedure: LEFT HEART CATH AND CORONARY ANGIOGRAPHY;  Surgeon: Belva Crome, MD;  Location: [place] CV LAB;  Service: Cardiovascular;  Laterality: N/A;   LOOP RECORDER INSERTION N/A 12/16/2021   Procedure: LOOP RECORDER INSERTION;  Surgeon: Constance Haw, MD;  Location: [place] CV LAB;  Service: Cardiovascular;  Laterality: N/A;   RADIOLOGY WITH ANESTHESIA N/A 12/10/2021   Procedure: IR WITH ANESTHESIA;  Surgeon: Luanne Bras, MD;  Location: [place];  Service: Radiology;  Laterality: N/A;   Patient Active Problem List   Diagnosis Date Noted   Esophagitis 12/26/2021   Right hemiplegia ([place]) 12/17/2021   History of ischemic left ICA stroke  12/10/2021   Acute left ICA ischemic stroke ([place]) 12/10/2021   CAD (coronary artery disease) 12/05/2021   Ischemic cardiomyopathy 12/05/2021   NSVT (nonsustained ventricular tachycardia) ([place]) 12/05/2021   Hypertension 12/05/2021   Hyperlipidemia 12/05/2021   NSTEMI (non-ST elevated myocardial infarction) ([place]) 12/04/2021   Long term (current) use of antithrombotics/antiplatelets    Loss of weight 09/04/2015   Encounter for screening colonoscopy 09/04/2015    ONSET DATE: August 2023  REFERRING DIAG: cva  THERAPY DIAG:  Difficulty in walking, not elsewhere classified  Other abnormalities of gait and mobility  Muscle weakness (generalized)  Rationale for Evaluation and Treatment: Rehabilitation  SUBJECTIVE:  SUBJECTIVE STATEMENT: Heart attack first then had a stroke a few days later; effected his right side.  Started getting some feeling back in right arm; right hand is swollen; question possible gout. Had some home therapy.  Did well with therapy.  Some changes in mood since stroke; memory.  Wife present with him.  Pt accompanied by: significant other  PERTINENT HISTORY: MI, stroke; seeing a psychiatrist for mood  PAIN:  Are you having pain? Yes: NPRS scale: 5/10 Pain location: right hand Pain description: sore Aggravating factors: unknown Relieving factors: taking prednisone  PRECAUTIONS: None  WEIGHT BEARING RESTRICTIONS: No  FALLS: Has patient fallen in last 6 months? No  LIVING ENVIRONMENT: Lives with: lives with their spouse Lives in: House/apartment Stairs: Yes: External: 1 steps; none Has following equipment at home: Single point cane and shower chair  PLOF: Independent  PATIENT GOALS: move my arm; get my strength back  OBJECTIVE:   DIAGNOSTIC FINDINGS:    COGNITION: Overall cognitive status: Within functional limits for tasks assessed   SENSATION: Reports some numbness right arm/hand  COORDINATION:  Weakness right arm  EDEMA:  Swelling right arm  MUSCLE TONE: right arm decreased tone   POSTURE: rounded shoulders and forward head   LOWER EXTREMITY MMT:    MMT Right Eval Left Eval  Hip flexion 5 5  Hip extension 4 4+  Hip abduction    Hip adduction    Hip internal rotation    Hip external rotation    Knee flexion 4+ 4+  Knee extension 5 5  Ankle dorsiflexion 5 5  Ankle plantarflexion    Ankle inversion    Ankle eversion    (Blank rows = not tested)  BED MOBILITY:  Sit to supine Modified independence Supine to sit Modified independence Rolling to Right Modified independence Rolling to Left Modified independence  TRANSFERS: Assistive device utilized: None  Sit to stand: Complete Independence Stand to sit: Complete Independence      STAIRS: Level of Assistance: Complete Independence Stair Negotiation Technique: Alternating Pattern  with No Rails Number of Stairs: 12  Height of Stairs: 4" and 8"  Comments: no need for UE for balance; good speed  GAIT: Gait pattern: decreased arm swing- Right Distance walked: 447 ft Assistive device utilized: None Level of assistance: Complete Independence Comments: good gait speed; no LOB or path deviation noted  FUNCTIONAL TESTS:  5 times sit to stand: 13.10 sec 2 minute walk test: 447 ft  PATIENT SURVEYS:  LEFS 76/80 = 95%  TODAY'S TREATMENT:                                                                                                                              DATE: physical therapy evaluation and HEP instruction    PATIENT EDUCATION: Education details: Patient educated on exam findings, POC, scope of PT, HEP, and CVA recovery. Person educated: Patient Education method: Explanation, Demonstration, and Handouts Education comprehension: verbalized  understanding, returned demonstration,  verbal cues required, and tactile cues required   HOME EXERCISE PROGRAM: Access Code: [email or web address] URL: [email or web address] Date: 04/17/2022 Prepared by: [facility] - Rehab  Exercises - Supine Bridge  - 1 x daily - 7 x weekly - 3 sets - 10 reps  GOALS: Goals reviewed with patient? No  SHORT TERM GOALS: Target date: 04/17/2022  patient will be independent with initial HEP  Baseline: Goal status: MET  2.  Patient will be independent in self management strategies to improve quality of life and functional outcomes.  Baseline:  Goal status: MET   ASSESSMENT:  CLINICAL IMPRESSION: Patient is a 66 y.o. male who was seen today for physical therapy evaluation and treatment for CVA with resultant right side weakness. Patient demonstrates decreased strength,and gait abnormalities which are negatively impacting patient ability to perform ADLs and functional mobility tasks. Patient will benefit from skilled physical therapy services to address these deficits to improve level of function with ADLs, functional mobility tasks, and reduce risk for falls. One visit only for HEP instruction as patient is near baseline for lower extremity strength, gait and balance.  Will need OT services to address right upper extremity weakness, decreased arm swing with ambulation and decreased functional use of right hand for functional activity.   OBJECTIVE IMPAIRMENTS: Abnormal gait, decreased activity tolerance, decreased cognition, decreased knowledge of condition, decreased mobility, decreased ROM, decreased strength, hypomobility, increased edema, increased fascial restrictions, impaired perceived functional ability, impaired flexibility, impaired tone, impaired UE functional use, and pain.   ACTIVITY LIMITATIONS: carrying, lifting, bathing, dressing, reach over head, hygiene/grooming, and caring for others  PARTICIPATION LIMITATIONS: meal prep, cleaning, laundry,  medication management, driving, and yard work  [employer] POTENTIAL: Good  CLINICAL DECISION MAKING: Stable/uncomplicated  EVALUATION COMPLEXITY: Moderate  PLAN:  PT FREQUENCY:  one time visit only  PT DURATION: one time visit only  PLANNED INTERVENTIONS: Therapeutic exercises, Therapeutic activity, Neuromuscular re-education, Balance training, Gait training, Patient/Family education, Joint manipulation, Joint mobilization, Stair training, Orthotic/Fit training, DME instructions, Aquatic Therapy, Dry Needling, Electrical stimulation, Spinal manipulation, Spinal mobilization, Cryotherapy, Moist heat, Compression bandaging, scar mobilization, Splintting, Taping, Traction, Ultrasound, Ionotophoresis 64m/ml Dexamethasone, and Manual therapy   PLAN FOR NEXT SESSION: one time visit only   9:50 AM, 04/17/22 Shenicka Sunderlin Small Deslyn Cavenaugh MPT [facility] [place] #[phone]:[phone]

## 2022-04-17 ENCOUNTER — Ambulatory Visit (HOSPITAL_COMMUNITY): Payer: Medicare HMO | Admitting: Occupational Therapy

## 2022-04-17 ENCOUNTER — Ambulatory Visit (HOSPITAL_COMMUNITY): Payer: Medicare HMO | Attending: Emergency Medicine

## 2022-04-17 DIAGNOSIS — I639 Cerebral infarction, unspecified: Secondary | ICD-10-CM | POA: Diagnosis present

## 2022-04-17 DIAGNOSIS — I69351 Hemiplegia and hemiparesis following cerebral infarction affecting right dominant side: Secondary | ICD-10-CM | POA: Diagnosis present

## 2022-04-17 DIAGNOSIS — R262 Difficulty in walking, not elsewhere classified: Secondary | ICD-10-CM | POA: Insufficient documentation

## 2022-04-17 DIAGNOSIS — R29818 Other symptoms and signs involving the nervous system: Secondary | ICD-10-CM | POA: Diagnosis present

## 2022-04-17 DIAGNOSIS — R278 Other lack of coordination: Secondary | ICD-10-CM

## 2022-04-17 DIAGNOSIS — M25512 Pain in left shoulder: Secondary | ICD-10-CM

## 2022-04-17 DIAGNOSIS — R2689 Other abnormalities of gait and mobility: Secondary | ICD-10-CM | POA: Insufficient documentation

## 2022-04-17 DIAGNOSIS — M6281 Muscle weakness (generalized): Secondary | ICD-10-CM | POA: Insufficient documentation

## 2022-04-17 NOTE — Therapy (Unsigned)
OUTPATIENT OCCUPATIONAL THERAPY NEURO EVALUATION  Patient Name: Terry Mayo MRN: [id number] DOB:1955/11/12, 66 y.o., male Today's Date: 04/22/2022  PCP: Annye Asa, MD REFERRING PROVIDER: Annye Asa, MD  END OF SESSION:  OT End of Session - 04/22/22 0950     Visit Number 1    Number of Visits 13    Date for OT Re-Evaluation 06/05/22    Authorization Type [employer], $20 copay    Progress Note Due on Visit 10    OT Start Time 0900    OT Stop Time 0945    OT Time Calculation (min) 45 min    Activity Tolerance Patient tolerated treatment well    Behavior During Therapy [facility] for tasks assessed/performed             Past Medical History:  Diagnosis Date   Arthritis    Essential hypertension    Hyperlipidemia    Kidney stones    MI (myocardial infarction) (HCC)    Stroke [facility])    Past Surgical History:  Procedure Laterality Date   CHOLECYSTECTOMY  1990s   COLONOSCOPY WITH PROPOFOL N/A 09/30/2015   Procedure: COLONOSCOPY WITH PROPOFOL;  Surgeon: Corbin Ade, MD;  [facility];  Service: Endoscopy;  Laterality: N/A;  930   IR CT HEAD LTD  12/10/2021   IR PERCUTANEOUS ART THROMBECTOMY/INFUSION INTRACRANIAL INC DIAG ANGIO  12/10/2021   IR US GUIDE VASC ACCESS RIGHT  12/10/2021   KIDNEY STONE SURGERY  2005   LEFT HEART CATH AND CORONARY ANGIOGRAPHY N/A 12/04/2021   Procedure: LEFT HEART CATH AND CORONARY ANGIOGRAPHY;  Surgeon: Lyn Records, MD;  Location: [facility] INVASIVE CV LAB;  Service: Cardiovascular;  Laterality: N/A;   LOOP RECORDER INSERTION N/A 12/16/2021   Procedure: LOOP RECORDER INSERTION;  Surgeon: Regan Lemming, MD;  Location: [facility] INVASIVE CV LAB;  Service: Cardiovascular;  Laterality: N/A;   RADIOLOGY WITH ANESTHESIA N/A 12/10/2021   Procedure: IR WITH ANESTHESIA;  Surgeon: Julieanne Cotton, MD;  Location: [facility] OR;  Service: Radiology;  Laterality: N/A;   Patient Active Problem List   Diagnosis Date Noted   Esophagitis 12/26/2021    Right hemiplegia (HCC) 12/17/2021   History of ischemic left ICA stroke 12/10/2021   Acute left ICA ischemic stroke (HCC) 12/10/2021   CAD (coronary artery disease) 12/05/2021   Ischemic cardiomyopathy 12/05/2021   NSVT (nonsustained ventricular tachycardia) (HCC) 12/05/2021   Hypertension 12/05/2021   Hyperlipidemia 12/05/2021   NSTEMI (non-ST elevated myocardial infarction) (HCC) 12/04/2021   Long term (current) use of antithrombotics/antiplatelets    Loss of weight 09/04/2015   Encounter for screening colonoscopy 09/04/2015    ONSET DATE: 12/10/21  REFERRING DIAG: L CVA  THERAPY DIAG:  Other lack of coordination - Plan: Ot plan of care cert/re-cert  Acute pain of left shoulder - Plan: Ot plan of care cert/re-cert  Other symptoms and signs involving the nervous system - Plan: Ot plan of care cert/re-cert  Rationale for Evaluation and Treatment: Rehabilitation  SUBJECTIVE:   SUBJECTIVE STATEMENT: "This arm just doesn't want to do right." Pt accompanied by: self and significant other  PERTINENT HISTORY: Pt had a heart attack and was hospitalized. Once he discharged home a few days, he presented with slurred speech, balance deficits, and R sided weakness. Pt diagnosed with L ischemic Stroke. He went to [facility] for Rehab for several weeks.  PRECAUTIONS: Fall  WEIGHT BEARING RESTRICTIONS: No  PAIN:  Are you having pain? Yes: NPRS scale: 5/10 Pain location: R  hand Pain description: achy and sharp Aggravating factors: movement Relieving factors: elevation and medication  FALLS: Has patient fallen in last 6 months? Yes. Number of falls 1 - when he had the stroke  LIVING ENVIRONMENT: Lives with: lives with their spouse Lives in: House/apartment Stairs: Yes: External: 1 steps; none Has following equipment at home: shower chair, bed side commode, and Grab bars  PLOF: Independent  PATIENT GOALS: To get the hand working "right"  OBJECTIVE:   HAND DOMINANCE:  Left  ADLs: Overall ADLs: Pt reports difficulty with dressing, unable to do any fasteners/zippers, Pt unable to shave or fix his hair, pt unable to do any IADL's at this time. Per wife's report she helps pt around 90% with all of his daily care.   MOBILITY STATUS: Independent  POSTURE COMMENTS:  No Significant postural limitations Sitting balance: Moves/returns truncal midpoint >2 inches in all planes  ACTIVITY TOLERANCE: Activity tolerance: Pt having some increased fatigue.  FUNCTIONAL OUTCOME MEASURES: Quick Dash: 85.0%  UPPER EXTREMITY ROM:    Passive ROM Right eval  Shoulder flexion 100  Shoulder abduction 89  Shoulder internal rotation 90  Shoulder external rotation 41  Elbow flexion 139  Elbow extension 8  Wrist flexion 60  Wrist extension 32  Wrist ulnar deviation 13  Wrist radial deviation 28  Wrist pronation 63  Wrist supination WFL  (Blank rows = not tested)   Active ROM Right eval  Shoulder flexion 40  Shoulder abduction 56  Shoulder internal rotation 90  Shoulder external rotation 30  Elbow flexion 122  Elbow extension 6  Wrist flexion 54  Wrist extension 10  Wrist ulnar deviation 10  Wrist radial deviation 17  Wrist pronation 70  Wrist supination WFL  (Blank rows = not tested) UPPER EXTREMITY MMT:     MMT Right eval  Shoulder flexion   Shoulder abduction   Shoulder adduction   Shoulder extension   Shoulder internal rotation   Shoulder external rotation   Middle trapezius   Lower trapezius   Elbow flexion   Elbow extension   Wrist flexion   Wrist extension   Wrist ulnar deviation   Wrist radial deviation   Wrist pronation   Wrist supination   (Blank rows = not tested)  HAND FUNCTION: Grip strength: Right: -- lbs; Left: -- lbs, Lateral pinch: Right: -- lbs, Left: -- lbs, and 3 point pinch: Right: -- lbs, Left: -- lbs  COORDINATION: Box and Blocks:  Right --blocks, Left --blocks  SENSATION: Light touch: Impaired   EDEMA:  Moderate swelling noted in the Right hand and wrist  MUSCLE TONE: RUE: Mild  COGNITION: Overall cognitive status: Impaired  VISION: Subjective report: With onset of CVA pt reports blurriness Baseline vision:  Supposed to wear glasses, does not have them anymore Visual history:  No pertinent history  VISION ASSESSMENT: Reading acuity: Some blurriness in the beginning, now he reports at his baseline.  PERCEPTION: WFL  PRAXIS: Impaired: Motor planning  OBSERVATIONS: Limited coordination and ROM in the RUE   TODAY'S TREATMENT:  DATE:  04/17/22: Evaluation   PATIENT EDUCATION: Education details: Will begin next session Person educated: Patient and Spouse Education method: Explanation Education comprehension: verbalized understanding  HOME EXERCISE PROGRAM: Will start first session   GOALS: Goals reviewed with patient? Yes  SHORT TERM GOALS: Target date: 05/15/22  Pt will be educated on HEP to improve ability to actively use RUE during functional task completion.  Goal status: INITIAL  2.  Pt will increase A/ROM in RUE to [facility] in order to improve ability to perform dressing tasks with minimal compensatory strategies.  Goal status: INITIAL  3.  Pt will increase strength in the RUE to 4-/5 in order to improve ability to actively assist the LUE in lifting tasks required during grooming and bathing.  Goal status: INITIAL  4.  Pt will decrease pain in the RUE to 2/10 or less in order to sleep for 3+ consecutive hours without waking due to pain.  Goal status: INITIAL  5.  Pt will be educated in desensitization techniques to improve tolerance to clothing on the RUE.  Goal status: INITIAL  6.  Pt will increase ROM in right digits to improve ability to form a full grasp required for holding items. Goal status: INITIAL  LONG TERM GOALS: Target date:  06/05/22  Pt will be educated on and demonstrate independence in use of AE during meal preparation tasks, as well as self feeding as needed. Goal status: INITIAL  2.  Pt will improve strength in RUE to 4+/5 or better in order to independently complete lifting tasks required by cooking and cleaning activities.  Goal status: INITIAL  3.  Pt will increase fine motor coordination in RUE by completing 9 hole peg test in under 1 min to improve ability to perform dressing tasks including operating buttons and zippers.  Goal status: INITIAL  4.  Pt will increase right grip strength by 10# and pinch strength by 3# to improve ability to grasp and hold pots and pans during meal preparation. Goal status: INITIAL   ASSESSMENT:  CLINICAL IMPRESSION: Patient is a 66 y.o. male who was seen today for occupational therapy evaluation for R sided weakness and incoordination s/p L ischemic stroke. Prior to the CVA, pt was very independent with all ADL's and IADL's, including driving. At this time, pt presents with increased weakness, poor coordination, and limited ROM. The right hand has increased swelling, with inability to make a fist to grab any objects or complete a coordination task. He will benefit from skilled OT to maximize independence and safety, by improving strength, coordination, and providing AE for ADL and IADL completion.    PERFORMANCE DEFICITS: in functional skills including ADLs, IADLs, coordination, dexterity, sensation, edema, tone, ROM, strength, pain, fascial restrictions, Fine motor control, Gross motor control, body mechanics, endurance, and UE functional use.  IMPAIRMENTS: are limiting patient from ADLs, IADLs, rest and sleep, leisure, and social participation.   CO-MORBIDITIES: may have co-morbidities  that affects occupational performance. Patient will benefit from skilled OT to address above impairments and improve overall function.  MODIFICATION OR ASSISTANCE TO COMPLETE EVALUATION:  No modification of tasks or assist necessary to complete an evaluation.  OT OCCUPATIONAL PROFILE AND HISTORY: Problem focused assessment: Including review of records relating to presenting problem.  CLINICAL DECISION MAKING: LOW - limited treatment options, no task modification necessary  REHAB POTENTIAL: Good  EVALUATION COMPLEXITY: Low    PLAN:  OT FREQUENCY: 2x/week  OT DURATION: 6 weeks  PLANNED INTERVENTIONS: self care/ADL training, therapeutic exercise, therapeutic  activity, neuromuscular re-education, manual therapy, passive range of motion, functional mobility training, electrical stimulation, ultrasound, moist heat, cryotherapy, patient/family education, cognitive remediation/compensation, and DME and/or AE instructions  RECOMMENDED OTHER SERVICES: PT and Speech  CONSULTED AND AGREED WITH PLAN OF CARE: Patient and family member/caregiver  PLAN FOR NEXT SESSION: P/ROM, AA/ROM, A/ROM, E-stim, closed chain exercises, weight bearing  Paulita Fujita, OTR/L [place], [place] 04/22/2022, 10:25 AM

## 2022-04-21 ENCOUNTER — Ambulatory Visit (HOSPITAL_COMMUNITY): Payer: Medicare HMO | Admitting: Speech Pathology

## 2022-04-22 ENCOUNTER — Ambulatory Visit (HOSPITAL_COMMUNITY): Payer: Medicare HMO | Admitting: Occupational Therapy

## 2022-04-22 ENCOUNTER — Encounter (HOSPITAL_COMMUNITY): Payer: Self-pay | Admitting: Occupational Therapy

## 2022-04-22 DIAGNOSIS — R29818 Other symptoms and signs involving the nervous system: Secondary | ICD-10-CM

## 2022-04-22 DIAGNOSIS — R278 Other lack of coordination: Secondary | ICD-10-CM

## 2022-04-22 DIAGNOSIS — R262 Difficulty in walking, not elsewhere classified: Secondary | ICD-10-CM | POA: Diagnosis not present

## 2022-04-22 DIAGNOSIS — M25512 Pain in left shoulder: Secondary | ICD-10-CM

## 2022-04-22 NOTE — Therapy (Signed)
OUTPATIENT OCCUPATIONAL THERAPY NEURO TREATMENT NOTE  Patient Name: Terry Mayo MRN: [id number] DOB:1955-12-18, 66 y.o., male Today's Date: 04/22/2022  PCP: Zebedee Iba, MD REFERRING PROVIDER: Zebedee Iba, MD  END OF SESSION:  OT End of Session - 04/22/22 1603     Visit Number 2    Number of Visits 13    Date for OT Re-Evaluation 06/05/22    Authorization Type [employer], $20 copay    Authorization - Visit Number 1    Authorization - Number of Visits 12    Progress Note Due on Visit 10    OT Start Time 1603    OT Stop Time 1645    OT Time Calculation (min) 42 min    Activity Tolerance Patient tolerated treatment well    Behavior During Therapy WFL for tasks assessed/performed             Past Medical History:  Diagnosis Date   Arthritis    Essential hypertension    Hyperlipidemia    Kidney stones    MI (myocardial infarction) ([place])    Stroke [facility])    Past Surgical History:  Procedure Laterality Date   CHOLECYSTECTOMY  1990s   COLONOSCOPY WITH PROPOFOL N/A 09/30/2015   Procedure: COLONOSCOPY WITH PROPOFOL;  Surgeon: Daneil Dolin, MD;  [facility];  Service: Endoscopy;  Laterality: N/A;  930   IR CT HEAD LTD  12/10/2021   IR PERCUTANEOUS ART THROMBECTOMY/INFUSION INTRACRANIAL INC DIAG ANGIO  12/10/2021   IR US GUIDE VASC ACCESS RIGHT  12/10/2021   KIDNEY STONE SURGERY  2005   LEFT HEART CATH AND CORONARY ANGIOGRAPHY N/A 12/04/2021   Procedure: LEFT HEART CATH AND CORONARY ANGIOGRAPHY;  Surgeon: Belva Crome, MD;  Location: [place] CV LAB;  Service: Cardiovascular;  Laterality: N/A;   LOOP RECORDER INSERTION N/A 12/16/2021   Procedure: LOOP RECORDER INSERTION;  Surgeon: Constance Haw, MD;  Location: [place] CV LAB;  Service: Cardiovascular;  Laterality: N/A;   RADIOLOGY WITH ANESTHESIA N/A 12/10/2021   Procedure: IR WITH ANESTHESIA;  Surgeon: Luanne Bras, MD;  Location: [place];  Service: Radiology;  Laterality: N/A;    Patient Active Problem List   Diagnosis Date Noted   Esophagitis 12/26/2021   Right hemiplegia ([place]) 12/17/2021   History of ischemic left ICA stroke 12/10/2021   Acute left ICA ischemic stroke ([place]) 12/10/2021   CAD (coronary artery disease) 12/05/2021   Ischemic cardiomyopathy 12/05/2021   NSVT (nonsustained ventricular tachycardia) ([place]) 12/05/2021   Hypertension 12/05/2021   Hyperlipidemia 12/05/2021   NSTEMI (non-ST elevated myocardial infarction) ([place]) 12/04/2021   Long term (current) use of antithrombotics/antiplatelets    Loss of weight 09/04/2015   Encounter for screening colonoscopy 09/04/2015    ONSET DATE: 12/10/21  REFERRING DIAG: L CVA  THERAPY DIAG:  Acute pain of left shoulder  Other lack of coordination  Other symptoms and signs involving the nervous system  Rationale for Evaluation and Treatment: Rehabilitation  SUBJECTIVE:   SUBJECTIVE STATEMENT: "This arm just doesn't want to do right."  PERTINENT HISTORY: Pt had a heart attack and was hospitalized. Once he discharged home a few days, he presented with slurred speech, balance deficits, and R sided weakness. Pt diagnosed with L ischemic Stroke. He went to [facility] for Rehab for several weeks.  PRECAUTIONS: Fall  WEIGHT BEARING RESTRICTIONS: No  PAIN:  Are you having pain? Yes: NPRS scale: 5/10 Pain location: R hand Pain description: achy and sharp Aggravating factors: movement Relieving  factors: elevation and medication  FALLS: Has patient fallen in last 6 months? Yes. Number of falls 1 - when he had the stroke  LIVING ENVIRONMENT: Lives with: lives with their spouse Lives in: House/apartment Stairs: Yes: External: 1 steps; none Has following equipment at home: shower chair, bed side commode, and Grab bars  PLOF: Independent  PATIENT GOALS: To get the hand working "right"  OBJECTIVE:   HAND DOMINANCE: Left  ADLs: Overall ADLs: Pt reports difficulty with dressing, unable to do  any fasteners/zippers, Pt unable to shave or fix his hair, pt unable to do any IADL's at this time. Per wife's report she helps pt around 90% with all of his daily care.   MOBILITY STATUS: Independent  POSTURE COMMENTS:  No Significant postural limitations Sitting balance: Moves/returns truncal midpoint >2 inches in all planes  ACTIVITY TOLERANCE: Activity tolerance: Pt having some increased fatigue.  FUNCTIONAL OUTCOME MEASURES: Quick Dash: 85.0%  UPPER EXTREMITY ROM:    Passive ROM Right eval  Shoulder flexion 100  Shoulder abduction 89  Shoulder internal rotation 90  Shoulder external rotation 41  Elbow flexion 139  Elbow extension 8  Wrist flexion 60  Wrist extension 32  Wrist ulnar deviation 13  Wrist radial deviation 28  Wrist pronation 63  Wrist supination WFL  (Blank rows = not tested)   Active ROM Right eval  Shoulder flexion 40  Shoulder abduction 56  Shoulder internal rotation 90  Shoulder external rotation 30  Elbow flexion 122  Elbow extension 6  Wrist flexion 54  Wrist extension 10  Wrist ulnar deviation 10  Wrist radial deviation 17  Wrist pronation 70  Wrist supination WFL  (Blank rows = not tested) UPPER EXTREMITY MMT:     MMT Right eval  Shoulder flexion   Shoulder abduction   Shoulder adduction   Shoulder extension   Shoulder internal rotation   Shoulder external rotation   Middle trapezius   Lower trapezius   Elbow flexion   Elbow extension   Wrist flexion   Wrist extension   Wrist ulnar deviation   Wrist radial deviation   Wrist pronation   Wrist supination   (Blank rows = not tested)  HAND FUNCTION: Grip strength: Right: -- lbs; Left: -- lbs, Lateral pinch: Right: -- lbs, Left: -- lbs, and 3 point pinch: Right: -- lbs, Left: -- lbs  COORDINATION: Box and Blocks:  Right --blocks, Left --blocks  SENSATION: Light touch: Impaired   EDEMA: Moderate swelling noted in the Right hand and wrist  MUSCLE TONE: RUE:  Mild  COGNITION: Overall cognitive status: Impaired  VISION: Subjective report: With onset of CVA pt reports blurriness Baseline vision:  Supposed to wear glasses, does not have them anymore Visual history:  No pertinent history  VISION ASSESSMENT: Reading acuity: Some blurriness in the beginning, now he reports at his baseline.  PERCEPTION: WFL  PRAXIS: Impaired: Motor planning  OBSERVATIONS: Limited coordination and ROM in the RUE   TODAY'S TREATMENT:  DATE:  04/22/22: -AA/ROM: flexion and protraction x10 -Punching: in front, across midline, abducted x10 -Wrist AA/ROM: flexion/extension, ulnar/radial deviation, supination/pronation, x10, mod assist from OT -digit ROM: digit composite flexion, abduction/adduction, x10, mod assist from OT -A/ROM: elbow flexion, protraction, abduction, x8 -grasping cones, x10 and moving them across the table to release them -Pulleys: flexion and abduction  PATIENT EDUCATION: Education details: AA/ROM Person educated: Patient and Spouse Education method: Consulting civil engineer, Media planner, and Handouts Education comprehension: verbalized understanding and returned demonstration  HOME EXERCISE PROGRAM: 12/27: AA/ROM   GOALS: Goals reviewed with patient? Yes  SHORT TERM GOALS: Target date: 05/15/22  Pt will be educated on HEP to improve ability to actively use RUE during functional task completion.  Goal status: IN PROGRESS  2.  Pt will increase A/ROM in RUE to [facility] in order to improve ability to perform dressing tasks with minimal compensatory strategies.  Goal status: IN PROGRESS  3.  Pt will increase strength in the RUE to 4-/5 in order to improve ability to actively assist the LUE in lifting tasks required during grooming and bathing.  Goal status: IN PROGRESS  4.  Pt will decrease pain in the RUE to 2/10 or less in  order to sleep for 3+ consecutive hours without waking due to pain.  Goal status: IN PROGRESS  5.  Pt will be educated in desensitization techniques to improve tolerance to clothing on the RUE.  Goal status: IN PROGRESS  6.  Pt will increase ROM in right digits to improve ability to form a full grasp required for holding items. Goal status: IN PROGRESS  LONG TERM GOALS: Target date: 06/05/22  Pt will be educated on and demonstrate independence in use of AE during meal preparation tasks, as well as self feeding as needed. Goal status: IN PROGRESS  2.  Pt will improve strength in RUE to 4+/5 or better in order to independently complete lifting tasks required by cooking and cleaning activities.  Goal status: IN PROGRESS  3.  Pt will increase fine motor coordination in RUE by completing 9 hole peg test in under 1 min to improve ability to perform dressing tasks including operating buttons and zippers.  Goal status: IN PROGRESS  4.  Pt will increase right grip strength by 10# and pinch strength by 3# to improve ability to grasp and hold pots and pans during meal preparation. Goal status: IN PROGRESS   ASSESSMENT:  CLINICAL IMPRESSION: Patient presenting to session with stiffness and pain in his R shoulder and R hand. He worked on muscle activation and active movement this session with his entire RUE. With increased time, pt is able to grip on to larger objects and sustain a hold long enough to move them across the table. He continues to be unable to pick up any weighted object at this time. OT providing verbal cuing and min-mod assist for positioning and technique.    PLAN:  OT FREQUENCY: 2x/week  OT DURATION: 6 weeks  PLANNED INTERVENTIONS: self care/ADL training, therapeutic exercise, therapeutic activity, neuromuscular re-education, manual therapy, passive range of motion, functional mobility training, electrical stimulation, ultrasound, moist heat, cryotherapy, patient/family  education, cognitive remediation/compensation, and DME and/or AE instructions  RECOMMENDED OTHER SERVICES: PT and Speech  CONSULTED AND AGREED WITH PLAN OF CARE: Patient and family member/caregiver  PLAN FOR NEXT SESSION: P/ROM, AA/ROM, A/ROM, E-stim, closed chain exercises, weight bearing, gripping tasks  Paulita Fujita, OTR/L [facility] [place], [place] 04/22/2022, 4:04 PM

## 2022-04-22 NOTE — Patient Instructions (Signed)
Perform each exercise ____10-15____ reps. 2-3x days.   1) Protraction   Start by holding a wand or cane at chest height.  Next, slowly push the wand outwards in front of your body so that your elbows become fully straightened. Then, return to the original position.     2) Shoulder FLEXION   In the standing position, hold a wand/cane with both arms, palms down on both sides. Raise up the wand/cane allowing your unaffected arm to perform most of the effort. Your affected arm should be partially relaxed.      3) Horizontal Abduction/Adduction      Straight arms holding cane at shoulder height, bring cane to right, center, left. Repeat starting to left.   Copyright  VHI. All rights reserved.

## 2022-04-24 ENCOUNTER — Encounter (HOSPITAL_COMMUNITY): Payer: Self-pay | Admitting: Occupational Therapy

## 2022-04-24 ENCOUNTER — Ambulatory Visit (HOSPITAL_COMMUNITY): Payer: Medicare HMO | Admitting: Occupational Therapy

## 2022-04-24 ENCOUNTER — Telehealth: Payer: Self-pay

## 2022-04-24 DIAGNOSIS — I69351 Hemiplegia and hemiparesis following cerebral infarction affecting right dominant side: Secondary | ICD-10-CM

## 2022-04-24 DIAGNOSIS — R29818 Other symptoms and signs involving the nervous system: Secondary | ICD-10-CM

## 2022-04-24 DIAGNOSIS — R262 Difficulty in walking, not elsewhere classified: Secondary | ICD-10-CM | POA: Diagnosis not present

## 2022-04-24 DIAGNOSIS — R278 Other lack of coordination: Secondary | ICD-10-CM

## 2022-04-24 MED ORDER — EZETIMIBE 10 MG PO TABS: 10.0000 mg | ORAL_TABLET | Freq: Every day | ORAL | 3 refills | Status: AC

## 2022-04-24 NOTE — Therapy (Incomplete)
OUTPATIENT OCCUPATIONAL THERAPY NEURO TREATMENT NOTE  Patient Name: Terry Mayo MRN: [id number] DOB:July 09, 1955, 66 y.o., male Today's Date: 04/24/2022  PCP: Annye Asa, MD REFERRING PROVIDER: Annye Asa, MD  END OF SESSION:  OT End of Session - 04/24/22 1607     Visit Number 3    Number of Visits 13    Date for OT Re-Evaluation 06/05/22    Authorization Type [employer], $20 copay    Authorization - Visit Number 2    Authorization - Number of Visits 12    Progress Note Due on Visit 10    OT Start Time 1600    OT Stop Time 1640    OT Time Calculation (min) 40 min    Activity Tolerance Patient tolerated treatment well    Behavior During Therapy WFL for tasks assessed/performed             Past Medical History:  Diagnosis Date   Arthritis    Essential hypertension    Hyperlipidemia    Kidney stones    MI (myocardial infarction) (HCC)    Stroke [facility])    Past Surgical History:  Procedure Laterality Date   CHOLECYSTECTOMY  1990s   COLONOSCOPY WITH PROPOFOL N/A 09/30/2015   Procedure: COLONOSCOPY WITH PROPOFOL;  Surgeon: Corbin Ade, MD;  [facility];  Service: Endoscopy;  Laterality: N/A;  930   IR CT HEAD LTD  12/10/2021   IR PERCUTANEOUS ART THROMBECTOMY/INFUSION INTRACRANIAL INC DIAG ANGIO  12/10/2021   IR US GUIDE VASC ACCESS RIGHT  12/10/2021   KIDNEY STONE SURGERY  2005   LEFT HEART CATH AND CORONARY ANGIOGRAPHY N/A 12/04/2021   Procedure: LEFT HEART CATH AND CORONARY ANGIOGRAPHY;  Surgeon: Lyn Records, MD;  [facility] IN[facility];  Service: Cardiovascular;  Laterality: N/A;   LOOP RECORDER INSERTION N/A 12/16/2021   Procedure: LOOP RECORDER INSERTION;  Surgeon: Regan Lemming, MD;  Location: [facility] INV[facility] CV LAB;  Service: Cardiovascular;  Laterality: N/A;   RADIOLOGY WITH ANESTHESIA N/A 12/10/2021   Procedure: IR WITH ANESTHESIA;  Surgeon: Julieanne Cotton, MD;  Location: [facility] OR;  Service: Radiology;  Laterality: N/A;    Patient Active Problem List   Diagnosis Date Noted   Esophagitis 12/26/2021   Right hemiplegia (HCC) 12/17/2021   History of ischemic left ICA stroke 12/10/2021   Acute left ICA ischemic stroke (HCC) 12/10/2021   CAD (coronary artery disease) 12/05/2021   Ischemic cardiomyopathy 12/05/2021   NSVT (nonsustained ventricular tachycardia) (HCC) 12/05/2021   Hypertension 12/05/2021   Hyperlipidemia 12/05/2021   NSTEMI (non-ST elevated myocardial infarction) (HCC) 12/04/2021   Long term (current) use of antithrombotics/antiplatelets    Loss of weight 09/04/2015   Encounter for screening colonoscopy 09/04/2015    ONSET DATE: 12/10/21  REFERRING DIAG: L CVA  THERAPY DIAG:  Other lack of coordination  Other symptoms and signs involving the nervous system  Hemiplegia and hemiparesis following cerebral infarction affecting right dominant side (HCC)  Rationale for Evaluation and Treatment: Rehabilitation  SUBJECTIVE:   SUBJECTIVE STATEMENT: "This arm just doesn't want to do right."  PERTINENT HISTORY: Pt had a heart attack and was hospitalized. Once he discharged home a few days, he presented with slurred speech, balance deficits, and R sided weakness. Pt diagnosed with L ischemic Stroke. He went to [facility] for Rehab for several weeks.  PRECAUTIONS: Fall  WEIGHT BEARING RESTRICTIONS: No  PAIN:  Are you having pain? Yes: NPRS scale: 5/10 Pain location: R hand Pain description: achy  and sharp Aggravating factors: movement Relieving factors: elevation and medication  FALLS: Has patient fallen in last 6 months? Yes. Number of falls 1 - when he had the stroke  LIVING ENVIRONMENT: Lives with: lives with their spouse Lives in: House/apartment Stairs: Yes: External: 1 steps; none Has following equipment at home: shower chair, bed side commode, and Grab bars  PLOF: Independent  PATIENT GOALS: To get the hand working "right"  OBJECTIVE:   HAND DOMINANCE:  Left  ADLs: Overall ADLs: Pt reports difficulty with dressing, unable to do any fasteners/zippers, Pt unable to shave or fix his hair, pt unable to do any IADL's at this time. Per wife's report she helps pt around 90% with all of his daily care.   MOBILITY STATUS: Independent  POSTURE COMMENTS:  No Significant postural limitations Sitting balance: Moves/returns truncal midpoint >2 inches in all planes  ACTIVITY TOLERANCE: Activity tolerance: Pt having some increased fatigue.  FUNCTIONAL OUTCOME MEASURES: Quick Dash: 85.0%  UPPER EXTREMITY ROM:    Passive ROM Right eval  Shoulder flexion 100  Shoulder abduction 89  Shoulder internal rotation 90  Shoulder external rotation 41  Elbow flexion 139  Elbow extension 8  Wrist flexion 60  Wrist extension 32  Wrist ulnar deviation 13  Wrist radial deviation 28  Wrist pronation 63  Wrist supination WFL  (Blank rows = not tested)   Active ROM Right eval  Shoulder flexion 40  Shoulder abduction 56  Shoulder internal rotation 90  Shoulder external rotation 30  Elbow flexion 122  Elbow extension 6  Wrist flexion 54  Wrist extension 10  Wrist ulnar deviation 10  Wrist radial deviation 17  Wrist pronation 70  Wrist supination WFL  (Blank rows = not tested) UPPER EXTREMITY MMT:     MMT Right eval  Shoulder flexion   Shoulder abduction   Shoulder adduction   Shoulder extension   Shoulder internal rotation   Shoulder external rotation   Middle trapezius   Lower trapezius   Elbow flexion   Elbow extension   Wrist flexion   Wrist extension   Wrist ulnar deviation   Wrist radial deviation   Wrist pronation   Wrist supination   (Blank rows = not tested)  HAND FUNCTION: Grip strength: Right: -- lbs; Left: -- lbs, Lateral pinch: Right: -- lbs, Left: -- lbs, and 3 point pinch: Right: -- lbs, Left: -- lbs  COORDINATION: Box and Blocks:  Right --blocks, Left --blocks  SENSATION: Light touch: Impaired   EDEMA:  Moderate swelling noted in the Right hand and wrist  MUSCLE TONE: RUE: Mild  COGNITION: Overall cognitive status: Impaired  VISION: Subjective report: With onset of CVA pt reports blurriness Baseline vision:  Supposed to wear glasses, does not have them anymore Visual history:  No pertinent history  VISION ASSESSMENT: Reading acuity: Some blurriness in the beginning, now he reports at his baseline.  PERCEPTION: WFL  PRAXIS: Impaired: Motor planning  OBSERVATIONS: Limited coordination and ROM in the RUE   TODAY'S TREATMENT:  DATE:  04/24/22 -A/ROM: shoulder flexion, abduction, protraction, 2x10 -Ball Exercises: flexion, protraction, circles in flexion -Wrist A/ROM: flexion/extension in neutral, ulnar/radial deviation, supination/pronation, x10 -digit ROM: digit composite flexion, abduction/adduction, x10, mod assist from OT -Weight bearing: On table in modified plank position x20", modified push ups x10, Leaning on RUE and reaching forward with LUE x10 -Red Theraband: pulling across body x10 -  04/22/22: -AA/ROM: flexion and protraction x10 -Punching: in front, across midline, abducted x10 -Wrist AA/ROM: flexion/extension, ulnar/radial deviation, supination/pronation, x10, mod assist from OT -digit ROM: digit composite flexion, abduction/adduction, x10, mod assist from OT -A/ROM: elbow flexion, protraction, abduction, x8 -grasping cones, x10 and moving them across the table to release them -Pulleys: flexion and abduction  PATIENT EDUCATION: Education details: AA/ROM Person educated: Patient and Spouse Education method: Consulting civil engineer, Media planner, and Handouts Education comprehension: verbalized understanding and returned demonstration  HOME EXERCISE PROGRAM: 12/27: AA/ROM   GOALS: Goals reviewed with patient? Yes  SHORT TERM GOALS: Target  date: 05/15/22  Pt will be educated on HEP to improve ability to actively use RUE during functional task completion.  Goal status: IN PROGRESS  2.  Pt will increase A/ROM in RUE to [facility] in order to improve ability to perform dressing tasks with minimal compensatory strategies.  Goal status: IN PROGRESS  3.  Pt will increase strength in the RUE to 4-/5 in order to improve ability to actively assist the LUE in lifting tasks required during grooming and bathing.  Goal status: IN PROGRESS  4.  Pt will decrease pain in the RUE to 2/10 or less in order to sleep for 3+ consecutive hours without waking due to pain.  Goal status: IN PROGRESS  5.  Pt will be educated in desensitization techniques to improve tolerance to clothing on the RUE.  Goal status: IN PROGRESS  6.  Pt will increase ROM in right digits to improve ability to form a full grasp required for holding items. Goal status: IN PROGRESS  LONG TERM GOALS: Target date: 06/05/22  Pt will be educated on and demonstrate independence in use of AE during meal preparation tasks, as well as self feeding as needed. Goal status: IN PROGRESS  2.  Pt will improve strength in RUE to 4+/5 or better in order to independently complete lifting tasks required by cooking and cleaning activities.  Goal status: IN PROGRESS  3.  Pt will increase fine motor coordination in RUE by completing 9 hole peg test in under 1 min to improve ability to perform dressing tasks including operating buttons and zippers.  Goal status: IN PROGRESS  4.  Pt will increase right grip strength by 10# and pinch strength by 3# to improve ability to grasp and hold pots and pans during meal preparation. Goal status: IN PROGRESS   ASSESSMENT:  CLINICAL IMPRESSION: Patient presenting to session with stiffness and pain in his R shoulder and R hand. He worked on muscle activation and active movement this session with his entire RUE. With increased time, pt is able to grip on to  larger objects and sustain a hold long enough to move them across the table. He continues to be unable to pick up any weighted object at this time. OT providing verbal cuing and min-mod assist for positioning and technique.    PLAN:  OT FREQUENCY: 2x/week  OT DURATION: 6 weeks  PLANNED INTERVENTIONS: self care/ADL training, therapeutic exercise, therapeutic activity, neuromuscular re-education, manual therapy, passive range of motion, functional mobility training, electrical stimulation, ultrasound, moist heat, cryotherapy, patient/family  education, cognitive remediation/compensation, and DME and/or AE instructions  RECOMMENDED OTHER SERVICES: PT and Speech  CONSULTED AND AGREED WITH PLAN OF CARE: Patient and family member/caregiver  PLAN FOR NEXT SESSION: P/ROM, AA/ROM, A/ROM, E-stim, closed chain exercises, weight bearing, gripping tasks  Paulita Fujita, OTR/L [facility] [place], [place] 04/24/2022, 4:09 PM

## 2022-04-24 NOTE — Telephone Encounter (Signed)
Results discussed with wife, Zetia 10 mg daily #90 ,RF:3 to [employer]

## 2022-04-24 NOTE — Telephone Encounter (Signed)
-----   Message from Marjo Bicker, MD sent at 04/23/2022  4:12 PM EST ----- LDL > 70. Not at goal. Start Zetia 10 mg once daily. Has appointment with Pharm D for PCSK9 inhibitors initiation.

## 2022-04-29 ENCOUNTER — Encounter (HOSPITAL_COMMUNITY): Payer: Self-pay | Admitting: Occupational Therapy

## 2022-04-29 ENCOUNTER — Ambulatory Visit (HOSPITAL_COMMUNITY): Payer: Medicare HMO | Attending: Emergency Medicine | Admitting: Occupational Therapy

## 2022-04-29 DIAGNOSIS — I69351 Hemiplegia and hemiparesis following cerebral infarction affecting right dominant side: Secondary | ICD-10-CM

## 2022-04-29 DIAGNOSIS — R29818 Other symptoms and signs involving the nervous system: Secondary | ICD-10-CM

## 2022-04-29 DIAGNOSIS — R278 Other lack of coordination: Secondary | ICD-10-CM | POA: Diagnosis present

## 2022-04-29 DIAGNOSIS — R41841 Cognitive communication deficit: Secondary | ICD-10-CM | POA: Diagnosis present

## 2022-04-29 NOTE — Therapy (Unsigned)
OUTPATIENT OCCUPATIONAL THERAPY NEURO TREATMENT NOTE  Patient Name: Terry Mayo MRN: [id number] DOB:01/06/1956, 67 y.o., male Today's Date: 04/29/2022  PCP: Zebedee Iba, MD REFERRING PROVIDER: Zebedee Iba, MD  END OF SESSION:  OT End of Session - 04/29/22 1349     Visit Number 4    Number of Visits 13    Date for OT Re-Evaluation 06/05/22    Authorization Type [employer], $20 copay    Authorization - Visit Number 3    Authorization - Number of Visits 12    Progress Note Due on Visit 10    OT Start Time 1350    OT Stop Time 1430    OT Time Calculation (min) 40 min    Activity Tolerance Patient tolerated treatment well    Behavior During Therapy WFL for tasks assessed/performed             Past Medical History:  Diagnosis Date   Arthritis    Essential hypertension    Hyperlipidemia    Kidney stones    MI (myocardial infarction) ([place])    Stroke [facility])    Past Surgical History:  Procedure Laterality Date   CHOLECYSTECTOMY  1990s   COLONOSCOPY WITH PROPOFOL N/A 09/30/2015   Procedure: COLONOSCOPY WITH PROPOFOL;  Surgeon: Daneil Dolin, MD;  [facility];  Service: Endoscopy;  Laterality: N/A;  930   IR CT HEAD LTD  12/10/2021   IR PERCUTANEOUS ART THROMBECTOMY/INFUSION INTRACRANIAL INC DIAG ANGIO  12/10/2021   IR US GUIDE VASC ACCESS RIGHT  12/10/2021   KIDNEY STONE SURGERY  2005   LEFT HEART CATH AND CORONARY ANGIOGRAPHY N/A 12/04/2021   Procedure: LEFT HEART CATH AND CORONARY ANGIOGRAPHY;  Surgeon: Belva Crome, MD;  Location: [place] CV LAB;  Service: Cardiovascular;  Laterality: N/A;   LOOP RECORDER INSERTION N/A 12/16/2021   Procedure: LOOP RECORDER INSERTION;  Surgeon: Constance Haw, MD;  Location: [place] CV LAB;  Service: Cardiovascular;  Laterality: N/A;   RADIOLOGY WITH ANESTHESIA N/A 12/10/2021   Procedure: IR WITH ANESTHESIA;  Surgeon: Luanne Bras, MD;  Location: [place];  Service: Radiology;  Laterality: N/A;    Patient Active Problem List   Diagnosis Date Noted   Esophagitis 12/26/2021   Right hemiplegia ([place]) 12/17/2021   History of ischemic left ICA stroke 12/10/2021   Acute left ICA ischemic stroke ([place]) 12/10/2021   CAD (coronary artery disease) 12/05/2021   Ischemic cardiomyopathy 12/05/2021   NSVT (nonsustained ventricular tachycardia) ([place]) 12/05/2021   Hypertension 12/05/2021   Hyperlipidemia 12/05/2021   NSTEMI (non-ST elevated myocardial infarction) ([place]) 12/04/2021   Long term (current) use of antithrombotics/antiplatelets    Loss of weight 09/04/2015   Encounter for screening colonoscopy 09/04/2015    ONSET DATE: 12/10/21  REFERRING DIAG: L CVA  THERAPY DIAG:  Other lack of coordination  Other symptoms and signs involving the nervous system  Hemiplegia and hemiparesis following cerebral infarction affecting right dominant side ([place])  Rationale for Evaluation and Treatment: Rehabilitation  SUBJECTIVE:   SUBJECTIVE STATEMENT: "I can't do much"  PERTINENT HISTORY: Pt had a heart attack and was hospitalized. Once he discharged home a few days, he presented with slurred speech, balance deficits, and R sided weakness. Pt diagnosed with L ischemic Stroke. He went to [facility] for Rehab for several weeks.  PRECAUTIONS: Fall  WEIGHT BEARING RESTRICTIONS: No  PAIN:  Are you having pain? Yes: NPRS scale: 5/10 Pain location: R hand Pain description: achy and sharp Aggravating factors:  movement Relieving factors: elevation and medication  FALLS: Has patient fallen in last 6 months? Yes. Number of falls 1 - when he had the stroke  LIVING ENVIRONMENT: Lives with: lives with their spouse Lives in: House/apartment Stairs: Yes: External: 1 steps; none Has following equipment at home: shower chair, bed side commode, and Grab bars  PLOF: Independent  PATIENT GOALS: To get the hand working "right"  OBJECTIVE:   HAND DOMINANCE: Left  ADLs: Overall ADLs: Pt reports  difficulty with dressing, unable to do any fasteners/zippers, Pt unable to shave or fix his hair, pt unable to do any IADL's at this time. Per wife's report she helps pt around 90% with all of his daily care.   MOBILITY STATUS: Independent  POSTURE COMMENTS:  No Significant postural limitations Sitting balance: Moves/returns truncal midpoint >2 inches in all planes  ACTIVITY TOLERANCE: Activity tolerance: Pt having some increased fatigue.  FUNCTIONAL OUTCOME MEASURES: Quick Dash: 85.0%  UPPER EXTREMITY ROM:    Passive ROM Right eval  Shoulder flexion 100  Shoulder abduction 89  Shoulder internal rotation 90  Shoulder external rotation 41  Elbow flexion 139  Elbow extension 8  Wrist flexion 60  Wrist extension 32  Wrist ulnar deviation 13  Wrist radial deviation 28  Wrist pronation 63  Wrist supination WFL  (Blank rows = not tested)   Active ROM Right eval  Shoulder flexion 40  Shoulder abduction 56  Shoulder internal rotation 90  Shoulder external rotation 30  Elbow flexion 122  Elbow extension 6  Wrist flexion 54  Wrist extension 10  Wrist ulnar deviation 10  Wrist radial deviation 17  Wrist pronation 70  Wrist supination WFL  (Blank rows = not tested) UPPER EXTREMITY MMT:     MMT Right eval  Shoulder flexion   Shoulder abduction   Shoulder adduction   Shoulder extension   Shoulder internal rotation   Shoulder external rotation   Middle trapezius   Lower trapezius   Elbow flexion   Elbow extension   Wrist flexion   Wrist extension   Wrist ulnar deviation   Wrist radial deviation   Wrist pronation   Wrist supination   (Blank rows = not tested)  HAND FUNCTION: Grip strength: Right: -- lbs; Left: -- lbs, Lateral pinch: Right: -- lbs, Left: -- lbs, and 3 point pinch: Right: -- lbs, Left: -- lbs  COORDINATION: Box and Blocks:  Right --blocks, Left --blocks  SENSATION: Light touch: Impaired   EDEMA: Moderate swelling noted in the Right hand  and wrist  MUSCLE TONE: RUE: Mild  COGNITION: Overall cognitive status: Impaired  VISION: Subjective report: With onset of CVA pt reports blurriness Baseline vision:  Supposed to wear glasses, does not have them anymore Visual history:  No pertinent history  VISION ASSESSMENT: Reading acuity: Some blurriness in the beginning, now he reports at his baseline.  PERCEPTION: WFL  PRAXIS: Impaired: Motor planning  OBSERVATIONS: Limited coordination and ROM in the RUE   TODAY'S TREATMENT:  DATE:  04/29/22 -Supine exercises: 2lb wrist weight donned, foam roll in between BUE, chest press, flexion, horizontal abduction, x10 each -A/ROM: Supine, 2lb  wrist weight donned, shoulder flexion, abduction, protraction, er/IR, x10 -pinch strength: yellow resistance clip, lateral pinch x10 -Wrist A/ROM: flexion/extension in neutral, ulnar/radial deviation, supination/pronation, x10 -Theraputty: working on rolling into a ball and squeezing, working on moving thumb in abduction to better grip the putty and work on full squeezes -digit ROM: digit composite flexion, abduction/adduction, x10, mod assist from OT  04/24/22 -A/ROM: shoulder flexion, abduction, protraction, 2x10 -Ball Exercises: flexion, protraction, circles in flexion -Wrist A/ROM: flexion/extension in neutral, ulnar/radial deviation, supination/pronation, x10 -digit ROM: digit composite flexion, abduction/adduction, x10, mod assist from OT -Weight bearing: On table in modified plank position x20", modified push ups x10, Leaning on RUE and reaching forward with LUE x10 -Bilateral exercise: Supine, foam roll in between BUE, chest press, flexion, horizontal abduction, x10 each -Red Theraband: pulling across body x10 -grasping cones, x10 and stacking/unstacking them  04/22/22: -AA/ROM: flexion and protraction  x10 -Punching: in front, across midline, abducted x10 -Wrist AA/ROM: flexion/extension, ulnar/radial deviation, supination/pronation, x10, mod assist from OT -digit ROM: digit composite flexion, abduction/adduction, x10, mod assist from OT -A/ROM: elbow flexion, protraction, abduction, x8 -grasping cones, x10 and moving them across the table to release them -Pulleys: flexion and abduction  PATIENT EDUCATION: Education details: Digit ROM Person educated: Patient and Spouse Education method: Explanation, Demonstration, and Handouts Education comprehension: verbalized understanding and returned demonstration  HOME EXERCISE PROGRAM: 12/27: AA/ROM 12/29: A/ROM of shoulder, wrist, and fingers 1/3: Digit ROM   GOALS: Goals reviewed with patient? Yes  SHORT TERM GOALS: Target date: 05/15/22  Pt will be educated on HEP to improve ability to actively use RUE during functional task completion.  Goal status: IN PROGRESS  2.  Pt will increase A/ROM in RUE to [facility] in order to improve ability to perform dressing tasks with minimal compensatory strategies.  Goal status: IN PROGRESS  3.  Pt will increase strength in the RUE to 4-/5 in order to improve ability to actively assist the LUE in lifting tasks required during grooming and bathing.  Goal status: IN PROGRESS  4.  Pt will decrease pain in the RUE to 2/10 or less in order to sleep for 3+ consecutive hours without waking due to pain.  Goal status: IN PROGRESS  5.  Pt will be educated in desensitization techniques to improve tolerance to clothing on the RUE.  Goal status: IN PROGRESS  6.  Pt will increase ROM in right digits to improve ability to form a full grasp required for holding items. Goal status: IN PROGRESS  LONG TERM GOALS: Target date: 06/05/22  Pt will be educated on and demonstrate independence in use of AE during meal preparation tasks, as well as self feeding as needed. Goal status: IN PROGRESS  2.  Pt will improve  strength in RUE to 4+/5 or better in order to independently complete lifting tasks required by cooking and cleaning activities.  Goal status: IN PROGRESS  3.  Pt will increase fine motor coordination in RUE by completing 9 hole peg test in under 1 min to improve ability to perform dressing tasks including operating buttons and zippers.  Goal status: IN PROGRESS  4.  Pt will increase right grip strength by 10# and pinch strength by 3# to improve ability to grasp and hold pots and pans during meal preparation. Goal status: IN PROGRESS   ASSESSMENT:  CLINICAL IMPRESSION: This session  continuing to focus on active ROM. Therapist added 2lb wrist weights for all supine exercises, in order to apply tactile input through the arm, as well as light strengthening. Pt continues to state that he "can't do" certain movements, but with encouragement and time he was able to at least partially complete movements and tasks, such as gripping and abducting his thumb. OT providing up to mod A for certain active assisted movements, as well as mod verbal cuing throughout entire session.    PLAN:  OT FREQUENCY: 2x/week  OT DURATION: 6 weeks  PLANNED INTERVENTIONS: self care/ADL training, therapeutic exercise, therapeutic activity, neuromuscular re-education, manual therapy, passive range of motion, functional mobility training, electrical stimulation, ultrasound, moist heat, cryotherapy, patient/family education, cognitive remediation/compensation, and DME and/or AE instructions  RECOMMENDED OTHER SERVICES: PT and Speech  CONSULTED AND AGREED WITH PLAN OF CARE: Patient and family member/caregiver  PLAN FOR NEXT SESSION: P/ROM, AA/ROM, A/ROM, E-stim, closed chain exercises, weight bearing, gripping tasks  Paulita Fujita, OTR/L [facility] [place], [place] 04/29/2022, 2:22 PM

## 2022-04-30 ENCOUNTER — Ambulatory Visit: Payer: Medicare HMO | Admitting: Internal Medicine

## 2022-04-30 ENCOUNTER — Encounter (HOSPITAL_COMMUNITY): Payer: Self-pay | Admitting: Speech Pathology

## 2022-04-30 ENCOUNTER — Ambulatory Visit (HOSPITAL_COMMUNITY): Payer: Medicare HMO | Admitting: Speech Pathology

## 2022-04-30 DIAGNOSIS — R278 Other lack of coordination: Secondary | ICD-10-CM | POA: Diagnosis not present

## 2022-04-30 DIAGNOSIS — R41841 Cognitive communication deficit: Secondary | ICD-10-CM

## 2022-04-30 NOTE — Therapy (Signed)
OUTPATIENT SPEECH LANGUAGE PATHOLOGY EVALUATION   Patient Name: Terry Mayo MRN: [id number] DOB:07-14-1955, 67 y.o., male Today's Date: 04/30/2022  [facility]: [facility] REFERRING PROVIDER: Smith Robert, MD  END OF SESSION:  End of Session - 04/30/22 1420     Visit Number 1    Number of Visits 1    Authorization Type [employer]    SLP Start Time 0945    SLP Stop Time  1030    SLP Time Calculation (min) 45 min    Activity Tolerance Patient tolerated treatment well             Past Medical History:  Diagnosis Date   Arthritis    Essential hypertension    Hyperlipidemia    Kidney stones    MI (myocardial infarction) (HCC)    Stroke [facility])    Past Surgical History:  Procedure Laterality Date   CHOLECYSTECTOMY  1990s   COLONOSCOPY WITH PROPOFOL N/A 09/30/2015   Procedure: COLONOSCOPY WITH PROPOFOL;  Surgeon: Corbin Ade, MD;  Location: [facility];  Service: Endoscopy;  Laterality: N/A;  930   IR CT HEAD LTD  12/10/2021   IR PERCUTANEOUS ART THROMBECTOMY/INFUSION INTRACRANIAL INC DIAG ANGIO  12/10/2021   IR US GUIDE VASC ACCESS RIGHT  12/10/2021   KIDNEY STONE SURGERY  2005   LEFT HEART CATH AND CORONARY ANGIOGRAPHY N/A 12/04/2021   Procedure: LEFT HEART CATH AND CORONARY ANGIOGRAPHY;  Surgeon: Lyn Records, MD;  Location: [facility] INVASIVE CV LAB;  Service: Cardiovascular;  Laterality: N/A;   LOOP RECORDER INSERTION N/A 12/16/2021   Procedure: LOOP RECORDER INSERTION;  Surgeon: Regan Lemming, MD;  Location: [facility] INVASIVE CV LAB;  Service: Cardiovascular;  Laterality: N/A;   RADIOLOGY WITH ANESTHESIA N/A 12/10/2021   Procedure: IR WITH ANESTHESIA;  Surgeon: Julieanne Cotton, MD;  Location: [facility] OR;  Service: Radiology;  Laterality: N/A;   Patient Active Problem List   Diagnosis Date Noted   Esophagitis 12/26/2021   Right hemiplegia (HCC) 12/17/2021   History of ischemic left ICA stroke 12/10/2021   Acute left ICA ischemic stroke (HCC) 12/10/2021    CAD (coronary artery disease) 12/05/2021   Ischemic cardiomyopathy 12/05/2021   NSVT (nonsustained ventricular tachycardia) (HCC) 12/05/2021   Hypertension 12/05/2021   Hyperlipidemia 12/05/2021   NSTEMI (non-ST elevated myocardial infarction) (HCC) 12/04/2021   Long term (current) use of antithrombotics/antiplatelets    Loss of weight 09/04/2015   Encounter for screening colonoscopy 09/04/2015    ONSET DATE: 12/10/21   REFERRING DIAG: L CVA  THERAPY DIAG:  Cognitive communication deficit  Rationale for Evaluation and Treatment: Rehabilitation  SUBJECTIVE:   SUBJECTIVE STATEMENT: "It is getting better." Pt accompanied by: self  PERTINENT HISTORY: Patient is a 67 y.o. male with PMH: HTN, HLD, former smoker, recent NSTEMI s/p cath (12/04/21). He was reportedly doing well since cath procedure until he developed acute onset right sided weakness and dysarthria. He presented to the ED for evaluation and was administered TNK. CT head did not show any acute intracranial abnormality but CT angio head neck showed definite opacification of the left MCA and its branches; Infarct core in the left MCA territory. MRI pending. He was intubted on 12/10/21 for thrombectomy at 0130 and extubated same day 0938.   PAIN:  Are you having pain? No  FALLS: Has patient fallen in last 6 months?  No  LIVING ENVIRONMENT: Lives with: lives with their family Lives in: House/apartment  PLOF:  Level of assistance: Independent with ADLs,  Independent with IADLs Employment: Retired  PATIENT GOALS: "Move my hand."  OBJECTIVE:   DIAGNOSTIC FINDINGS: MRI: Large area of acute/subacute ischemia within the left MCA territory.   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, physiological   AUDITORY COMPREHENSION: Overall auditory comprehension: Appears intact YES/NO questions: Appears intact Following directions: Appears intact Conversation: Moderately Complex Interfering components: working Research scientist (life sciences): repetition/stressing words  READING COMPREHENSION: Not assessed  EXPRESSION: verbal  VERBAL EXPRESSION: Level of generative/spontaneous verbalization: conversation Automatic speech: name: intact and social response: intact  Repetition: Appears intact Naming: Responsive: 76-100% and Divergent: 76-100% Pragmatics: Appears intact Comments: N/A Interfering components:  N/A Effective technique:  N/A Non-verbal means of communication: N/A  WRITTEN EXPRESSION: Dominant hand: left Written expression: Not tested  MOTOR SPEECH: Overall motor speech: Appears intact Level of impairment:  N/A Respiration:  WNL Phonation: breathy Resonance: WFL Articulation: Appears intact Intelligibility: Intelligible Motor planning: Appears intact Motor speech errors:  N/A Interfering components:  N/A Effective technique: slow rate  ORAL MOTOR EXAMINATION: Overall status: Impaired:   Lingual: Right (Strength) Comments: Mild lingual deviation to the right  RECOMMENDATIONS FROM OBJECTIVE SWALLOW STUDY (MBSS/FEES):   October 2023 MBSS:  <<Pt presents with min oropharyngeal phase dysphagia characterized by limited dentition with impaired mastication resulting in prolonged oral transit and slight piecemeal deglutition and lingual residue and min delay in swallow initiation with contrast reaching the pyriforms with straw sips thin. Pt with adequate hyolaryngeal excursion and epiglottic deflection. No penetration or aspiration observed despite challenging Pt with sequential cup and straw sips. Pt with min lingual residuals of liquids which pool in the valleculae and pyriforms in trace amounts, but are spontaneously cleared with a second swallow. Pt with increased  vallecular and pyriform pooling after the swallow when taking sequential straw sips, however he did clear with a dry swallow. Pt with one episode of righ labial spillage of sequential cup sips thin when taking barium tablet (Pt tipped head back and continuously drank from cup). Esophageal sweep was unremarkable. Pt's swallow is essentially close to WNL, however Pt is limited by fear. SLP spoke with Pt and wife at length. Imaging from the MBSS was reviewed and Pt recorded a video on his phone so that he can watch at home when he becomes worried. Recommend regular textures and thin liquids and Pt to swallow 2x for each sip of liquid to ensure cleared pharynx (he tends to do this spontaneously anyway). No further SLP services indicated at this time.>>   STANDARDIZED ASSESSMENTS: SLUMS: 15/30 [facility] SLUMS Examination Orientation  3/3  Numeric Problem Solving  0/3  Memory  1/5  Attention 1/2  Thought Organization 2/3  Clock Drawing 4/4  Visuospatial Skills               2/2  Short Story Recall  2/8  Total  15/30     Scoring  High School Education  Less than High School Education   Normal  27-30 25-30  Mild Neurocognitive Disorder 21-26 20-24  Dementia  1-20 1-19      TODAY'S TREATMENT:  DATE: 04/30/22    PATIENT EDUCATION: Education details: No plan for SLP therapy as Pt is on regular textures and thin liquids, only mild dysarthria, Pt with memory and attention deficits however does not wish to address and may be baseline Person educated: Patient and Spouse Education method: Explanation and Handouts Education comprehension: verbalized understanding    ASSESSMENT:  CLINICAL IMPRESSION: Patient is a 67 y.o. male who was seen today for a cognitive linguistic evaluation following a referral from Smith Robert, MD. Pt scored 15/30 on the [facility] SLUMS (see  above) with deficits noted in the areas of mental calculation (he reports this is baseline), divergent naming (10 animals in one minute), recall (delayed recall 1/5 and paragraph recall 2/8). Pt reports that he feels he is at his baseline in terms of memory and has the necessary support at home to assist him with medication and financial management. He reported that he felt his speech was not yet back to baseline, however he was judged to be 100% intelligible and did not exhibit word finding deficits in conversation. He exhibits mild breathiness/hoarseness and his tongue deviates slightly to the right. He is consuming regular foods and liquids. Pt expressed complex thoughts and feelings during the evaluation. He has an 11th grade education and retired 2 years ago. No further SLP services indicated at this time.  OBJECTIVE IMPAIRMENTS: include attention, memory, dysarthria, and voice disorder. These impairments are limiting patient from managing medications and managing appointments. Factors affecting potential to achieve goals and functional outcome are previous level of function.   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   PLAN:  SLP FREQUENCY: one time visit     Thank you,  Terry Mayo, CCC-SLP [phone]  Terry Mayo, CCC-SLP 04/30/2022, 2:21 PM

## 2022-05-01 ENCOUNTER — Ambulatory Visit (HOSPITAL_COMMUNITY): Payer: Medicare HMO | Admitting: Occupational Therapy

## 2022-05-01 DIAGNOSIS — I69351 Hemiplegia and hemiparesis following cerebral infarction affecting right dominant side: Secondary | ICD-10-CM

## 2022-05-01 DIAGNOSIS — R29818 Other symptoms and signs involving the nervous system: Secondary | ICD-10-CM

## 2022-05-01 DIAGNOSIS — R278 Other lack of coordination: Secondary | ICD-10-CM

## 2022-05-04 ENCOUNTER — Ambulatory Visit (HOSPITAL_COMMUNITY): Payer: Medicare HMO | Attending: Emergency Medicine

## 2022-05-04 DIAGNOSIS — I63232 Cerebral infarction due to unspecified occlusion or stenosis of left carotid arteries: Secondary | ICD-10-CM | POA: Insufficient documentation

## 2022-05-05 ENCOUNTER — Telehealth: Payer: Self-pay | Admitting: Pharmacist

## 2022-05-05 ENCOUNTER — Other Ambulatory Visit (HOSPITAL_COMMUNITY): Payer: Self-pay

## 2022-05-05 ENCOUNTER — Ambulatory Visit: Payer: Medicare HMO | Attending: Cardiovascular Disease | Admitting: Pharmacist

## 2022-05-05 ENCOUNTER — Encounter: Payer: Self-pay | Admitting: Pharmacist

## 2022-05-05 DIAGNOSIS — I214 Non-ST elevation (NSTEMI) myocardial infarction: Secondary | ICD-10-CM | POA: Diagnosis not present

## 2022-05-05 DIAGNOSIS — E785 Hyperlipidemia, unspecified: Secondary | ICD-10-CM

## 2022-05-05 DIAGNOSIS — Z8673 Personal history of transient ischemic attack (TIA), and cerebral infarction without residual deficits: Secondary | ICD-10-CM | POA: Diagnosis not present

## 2022-05-05 DIAGNOSIS — I25118 Atherosclerotic heart disease of native coronary artery with other forms of angina pectoris: Secondary | ICD-10-CM

## 2022-05-05 DIAGNOSIS — K5900 Constipation, unspecified: Secondary | ICD-10-CM | POA: Insufficient documentation

## 2022-05-05 LAB — CUP PACEART REMOTE DEVICE CHECK
Date Time Interrogation Session: 20240107231409
Implantable Pulse Generator Implant Date: 20230822

## 2022-05-05 MED ORDER — REPATHA SURECLICK 140 MG/ML ~~LOC~~ SOAJ
1.0000 mL | SUBCUTANEOUS | 3 refills | Status: DC
  Filled 2022-05-05: qty 6, 84d supply, fill #0

## 2022-05-05 NOTE — Telephone Encounter (Signed)
PA for Repatha submitted. Key: [id number]

## 2022-05-05 NOTE — Addendum Note (Signed)
Addended by: Rollen Sox on: 05/05/2022 02:32 PM   Modules accepted: Orders

## 2022-05-05 NOTE — Progress Notes (Signed)
Patient ID: Terry Mayo                 DOB: 12-02-1955                    MRN: [id number]     HPI: Terry Mayo is a 67 y.o. male patient referred to lipid clinic by Dr Dellia Cloud. PMH is significant for CAD, NSTEMI, HLD, HTN. Admitted for NSTEMI on 12/04/21 and then again on 12/09/21 for acute stroke.  Previously on atorvastatin but this was d/c due to elevated LFTs. Currently on ezetimibe.  Patient and wife present today. Came in transportation from [place]. Patient is left handed and has lost strength in his right hand so is concerned regarding his ability to administer injectable medications.  Reports he feels well. Denies chest pain or SOB.   Current Medications:  Ezetimibe 10mg  daily  Intolerances:  Atorvastatin  Risk Factors:  Hx of NSTEMI HX of CVA HHLD Former smoker  LDL goal: <55  Labs:TC 236, HDL 72, LDL 146, Trigs 75 (04/13/22)  Past Medical History:  Diagnosis Date   Arthritis    Essential hypertension    Hyperlipidemia    Kidney stones    MI (myocardial infarction) ([place])    Stroke [facility])     Current Outpatient Medications on File Prior to Visit  Medication Sig Dispense Refill   polyethylene glycol powder (GLYCOLAX/MIRALAX) 17 GM/SCOOP powder 1 scoop mixed with 8 ounces of fluid Orally Once a day for 30 days     predniSONE (DELTASONE) 20 MG tablet Take by mouth.     traZODone (DESYREL) 50 MG tablet 1 tablet at bedtime as needed Orally Once a day for 30 day(s)     alum & mag hydroxide-simeth (MAALOX PLUS) 400-400-40 MG/5ML suspension Take 15 mLs by mouth every 6 (six) hours as needed for indigestion (pain with swallowing). 355 mL 0   aspirin EC 81 MG tablet Take 1 tablet (81 mg total) by mouth daily. Swallow whole. 30 tablet 3   busPIRone (BUSPAR) 7.5 MG tablet Take 1 tablet (7.5 mg total) by mouth 2 (two) times daily. 60 tablet 0   clopidogrel (PLAVIX) 75 MG tablet Take 1 tablet (75 mg total) by mouth daily with breakfast. 30 tablet 0   ezetimibe (ZETIA) 10  MG tablet Take 1 tablet (10 mg total) by mouth daily. 90 tablet 3   hydrOXYzine (VISTARIL) 25 MG capsule Take 25 mg by mouth 2 (two) times daily as needed for anxiety.     isosorbide dinitrate (ISORDIL) 20 MG tablet Take 1 tablet (20 mg total) by mouth 2 (two) times daily. 60 tablet 0   losartan (COZAAR) 25 MG tablet Take 1 tablet (25 mg total) by mouth daily. 30 tablet 0   metoprolol tartrate (LOPRESSOR) 25 MG tablet Take 0.5 tablets (12.5 mg total) by mouth 2 (two) times daily. 60 tablet 0   mirtazapine (REMERON) 15 MG tablet Take 15 mg by mouth at bedtime.     nitroGLYCERIN (NITROSTAT) 0.4 MG SL tablet Place 1 tablet (0.4 mg total) under the tongue every 5 (five) minutes x 3 doses as needed for chest pain. 20 tablet 0   omeprazole (PRILOSEC OTC) 20 MG tablet Take 20 mg by mouth as needed (acid reflux).     pantoprazole sodium (PROTONIX) 40 mg Take 40 mg by mouth daily. (Patient taking differently: Take 40 mg by mouth as needed.) 30 each 0   spironolactone (ALDACTONE) 25 MG tablet Take 0.5 tablets (  12.5 mg total) by mouth daily. 15 tablet 0   No current facility-administered medications on file prior to visit.    Allergies  Allergen Reactions   Penicillins     REACTION UNKNOWN Has patient had a PCN reaction causing immediate rash, facial/tongue/throat swelling, SOB or lightheadedness with hypotension: UNKNOWN Has patient had a PCN reaction causing severe rash involving mucus membranes or skin necrosis: UNKNOWN Has patient had a PCN reaction that required hospitalization UNKNOWN Has patient had a PCN reaction occurring within the last 10 years: UNKNOWN If all of the above answers are "NO", then may proceed with Cephalosporin use.     Assessment/Plan:  1. Hyperlipidemia - Patient's LDL elevated at 146 which is above goal of <55. Aggressive goal chosen due to hx of MI and stroke.Unable to tolerate statins due to increased hepatic enzymes. Tolerates ezetimibe.  Recommend patient begin  PCSK9i therapy to help reduce risk of future cardiac events. Using [employer], educated patient on mechanism of action, storage, site selection, administration and possible adverse effects. Will complete prior authorization and contact patient when approved. Due to location, it would be beneficial for patient to have medication delivered. Has Medicare and Medicaid so hopefully copay will be no charge, Will complete PA and contact when approved.  Continue ezetimibe 10mg  daily Start Repatha 140mg  q 2 weeks Recheck lipid panel in 2-3 months  Karren Cobble, PharmD, [place], [place], [place], [place], [place], [address] Phone: [phone], Fax: [phone]

## 2022-05-05 NOTE — Patient Instructions (Signed)
It was nice meeting you 2 today  We would like to start a new medication for your cholesterol called Repatha which you would inject once every 2 weeks  I will complete the prior authorization for you and contact you when it is approved  Once you start the medication we will recheck your cholesterol in 2-3 months. You can have that completed at any lab corp.    Please call me with any questions  Karren Cobble, PharmD, [place], [place], [place], [place], [place], [address] Phone: [phone], Fax: [phone]

## 2022-05-05 NOTE — Telephone Encounter (Signed)
PA approved through 04/27/23 

## 2022-05-06 ENCOUNTER — Encounter (HOSPITAL_COMMUNITY): Payer: Self-pay | Admitting: Occupational Therapy

## 2022-05-06 ENCOUNTER — Telehealth: Payer: Self-pay | Admitting: Internal Medicine

## 2022-05-06 ENCOUNTER — Ambulatory Visit (HOSPITAL_COMMUNITY): Payer: Medicare HMO | Admitting: Occupational Therapy

## 2022-05-06 ENCOUNTER — Other Ambulatory Visit (HOSPITAL_COMMUNITY): Payer: Self-pay

## 2022-05-06 DIAGNOSIS — Z8673 Personal history of transient ischemic attack (TIA), and cerebral infarction without residual deficits: Secondary | ICD-10-CM

## 2022-05-06 DIAGNOSIS — R29818 Other symptoms and signs involving the nervous system: Secondary | ICD-10-CM

## 2022-05-06 DIAGNOSIS — R278 Other lack of coordination: Secondary | ICD-10-CM | POA: Diagnosis not present

## 2022-05-06 DIAGNOSIS — I214 Non-ST elevation (NSTEMI) myocardial infarction: Secondary | ICD-10-CM

## 2022-05-06 DIAGNOSIS — I25118 Atherosclerotic heart disease of native coronary artery with other forms of angina pectoris: Secondary | ICD-10-CM

## 2022-05-06 MED ORDER — REPATHA SURECLICK 140 MG/ML ~~LOC~~ SOAJ: 1.0000 mL | SUBCUTANEOUS | 3 refills | Status: DC

## 2022-05-06 NOTE — Telephone Encounter (Signed)
Rx was sent yesterday to [facility]. Will resend.

## 2022-05-06 NOTE — Therapy (Signed)
OUTPATIENT OCCUPATIONAL THERAPY NEURO TREATMENT NOTE  Patient Name: Terry Mayo MRN: [id number] DOB:June 23, 1955, 67 y.o., male Today's Date: 05/01/2022  PCP: Annye Asa, MD REFERRING PROVIDER: Annye Asa, MD  END OF SESSION:   05/01/22 0945  OT Visits / Re-Eval  Visit Number 5  Number of Visits 13  Date for OT Re-Evaluation 06/05/22  Authorization  Authorization Type [employer], $20 copay  Authorization - Visit Number 4  Authorization - Number of Visits 12  Progress Note Due on Visit 10  OT Time Calculation  OT Start Time 0911  OT Stop Time 0952  OT Time Calculation (min) 41 min  End of Session  Activity Tolerance Patient tolerated treatment well  Behavior During Therapy [facility] for tasks assessed/performed     Past Medical History:  Diagnosis Date   Arthritis    Essential hypertension    Hyperlipidemia    Kidney stones    MI (myocardial infarction) (HCC)    Stroke [facility])    Past Surgical History:  Procedure Laterality Date   CHOLECYSTECTOMY  1990s   COLONOSCOPY WITH PROPOFOL N/A 09/30/2015   Procedure: COLONOSCOPY WITH PROPOFOL;  Surgeon: Corbin Ade, MD;  [facility]: [facility];  Service: Endoscopy;  Laterality: N/A;  930   IR CT HEAD LTD  12/10/2021   IR PERCUTANEOUS ART THROMBECTOMY/INFUSION INTRACRANIAL INC DIAG ANGIO  12/10/2021   IR US GUIDE VASC ACCESS RIGHT  12/10/2021   KIDNEY STONE SURGERY  2005   LEFT HEART CATH AND CORONARY ANGIOGRAPHY N/A 12/04/2021   Procedure: LEFT HEART CATH AND CORONARY ANGIOGRAPHY;  Surgeon: Lyn Records, MD;  Location: [facility] INVASIVE CV LAB;  Service: Cardiovascular;  Laterality: N/A;   LOOP RECORDER INSERTION N/A 12/16/2021   Procedure: LOOP RECORDER INSERTION;  Surgeon: Regan Lemming, MD;  Location: [facility] INVAS[facility] CV LAB;  Service: Cardiovascular;  Laterality: N/A;   RADIOLOGY WITH ANESTHESIA N/A 12/10/2021   Procedure: IR WITH ANESTHESIA;  Surgeon: Julieanne Cotton, MD;  Location: [facility] OR;  Service: Radiology;   Laterality: N/A;   Patient Active Problem List   Diagnosis Date Noted   Constipation 05/05/2022   Esophagitis 12/26/2021   Right hemiplegia (HCC) 12/17/2021   History of ischemic left ICA stroke 12/10/2021   Acute left ICA ischemic stroke (HCC) 12/10/2021   CAD (coronary artery disease) 12/05/2021   Ischemic cardiomyopathy 12/05/2021   NSVT (nonsustained ventricular tachycardia) (HCC) 12/05/2021   Hypertension 12/05/2021   Hyperlipidemia 12/05/2021   NSTEMI (non-ST elevated myocardial infarction) (HCC) 12/04/2021   Long term (current) use of antithrombotics/antiplatelets    Loss of weight 09/04/2015   Encounter for screening colonoscopy 09/04/2015    ONSET DATE: 12/10/21  REFERRING DIAG: L CVA  THERAPY DIAG:  Other lack of coordination  Other symptoms and signs involving the nervous system  Hemiplegia and hemiparesis following cerebral infarction affecting right dominant side (HCC)  Rationale for Evaluation and Treatment: Rehabilitation  SUBJECTIVE:   SUBJECTIVE STATEMENT: "You're gonna make my arm work"  PERTINENT HISTORY: Pt had a heart attack and was hospitalized. Once he discharged home a few days, he presented with slurred speech, balance deficits, and R sided weakness. Pt diagnosed with L ischemic Stroke. He went to [facility] for Rehab for several weeks.  PRECAUTIONS: Fall  WEIGHT BEARING RESTRICTIONS: No  PAIN:  Are you having pain? Yes: NPRS scale: 5/10 Pain location: R hand Pain description: achy and sharp Aggravating factors: movement Relieving factors: elevation and medication  FALLS: Has patient fallen in last 6 months? Yes.  Number of falls 1 - when he had the stroke  LIVING ENVIRONMENT: Lives with: lives with their spouse Lives in: House/apartment Stairs: Yes: External: 1 steps; none Has following equipment at home: shower chair, bed side commode, and Grab bars  PLOF: Independent  PATIENT GOALS: To get the hand working "right"  OBJECTIVE:    HAND DOMINANCE: Left  ADLs: Overall ADLs: Pt reports difficulty with dressing, unable to do any fasteners/zippers, Pt unable to shave or fix his hair, pt unable to do any IADL's at this time. Per wife's report she helps pt around 90% with all of his daily care.   MOBILITY STATUS: Independent  POSTURE COMMENTS:  No Significant postural limitations Sitting balance: Moves/returns truncal midpoint >2 inches in all planes  ACTIVITY TOLERANCE: Activity tolerance: Pt having some increased fatigue.  FUNCTIONAL OUTCOME MEASURES: Quick Dash: 85.0%  UPPER EXTREMITY ROM:    Passive ROM Right eval  Shoulder flexion 100  Shoulder abduction 89  Shoulder internal rotation 90  Shoulder external rotation 41  Elbow flexion 139  Elbow extension 8  Wrist flexion 60  Wrist extension 32  Wrist ulnar deviation 13  Wrist radial deviation 28  Wrist pronation 63  Wrist supination WFL  (Blank rows = not tested)   Active ROM Right eval  Shoulder flexion 40  Shoulder abduction 56  Shoulder internal rotation 90  Shoulder external rotation 30  Elbow flexion 122  Elbow extension 6  Wrist flexion 54  Wrist extension 10  Wrist ulnar deviation 10  Wrist radial deviation 17  Wrist pronation 70  Wrist supination WFL  (Blank rows = not tested) UPPER EXTREMITY MMT:     MMT Right eval  Shoulder flexion   Shoulder abduction   Shoulder adduction   Shoulder extension   Shoulder internal rotation   Shoulder external rotation   Middle trapezius   Lower trapezius   Elbow flexion   Elbow extension   Wrist flexion   Wrist extension   Wrist ulnar deviation   Wrist radial deviation   Wrist pronation   Wrist supination   (Blank rows = not tested)  HAND FUNCTION: Grip strength: Right: -- lbs; Left: -- lbs, Lateral pinch: Right: -- lbs, Left: -- lbs, and 3 point pinch: Right: -- lbs, Left: -- lbs  COORDINATION: Box and Blocks:  Right --blocks, Left --blocks  SENSATION: Light touch:  Impaired   EDEMA: Moderate swelling noted in the Right hand and wrist  MUSCLE TONE: RUE: Mild  COGNITION: Overall cognitive status: Impaired  VISION: Subjective report: With onset of CVA pt reports blurriness Baseline vision:  Supposed to wear glasses, does not have them anymore Visual history:  No pertinent history  VISION ASSESSMENT: Reading acuity: Some blurriness in the beginning, now he reports at his baseline.  PERCEPTION: WFL  PRAXIS: Impaired: Motor planning  OBSERVATIONS: Limited coordination and ROM in the RUE   TODAY'S TREATMENT:  DATE:  05/01/22 -P/ROM: shoulder flexion, shoulder abduction, wrist flexion/extension, wrist ulnar/radial deviation, supination/pronation, digit composite flexion/extension, x10 -Digit ROM: composite flexion (indep), abduction/adduction (mod A), x10 -Large peg board: reaching into container and grabbing 1 peg at a time and placing them in the peg board x8 (initially requiring mod A before improving to indep) -A/ROM: shoulder flexion, abduction, protraction, x10  04/29/22 -Supine exercises: 2lb wrist weight donned, foam roll in between BUE, chest press, flexion, horizontal abduction, x10 each -A/ROM: Supine, 2lb  wrist weight donned, shoulder flexion, abduction, protraction, er/IR, x10 -pinch strength: yellow resistance clip, lateral pinch x10 -Wrist A/ROM: flexion/extension in neutral, ulnar/radial deviation, supination/pronation, x10 -Theraputty: working on rolling into a ball and squeezing, working on moving thumb in abduction to better grip the putty and work on full squeezes -digit ROM: digit composite flexion, abduction/adduction, x10, mod assist from OT  04/24/22 -A/ROM: shoulder flexion, abduction, protraction, 2x10 -Ball Exercises: flexion, protraction, circles in flexion -Wrist A/ROM: flexion/extension in  neutral, ulnar/radial deviation, supination/pronation, x10 -digit ROM: digit composite flexion, abduction/adduction, x10, mod assist from OT -Weight bearing: On table in modified plank position x20", modified push ups x10, Leaning on RUE and reaching forward with LUE x10 -Bilateral exercise: Supine, foam roll in between BUE, chest press, flexion, horizontal abduction, x10 each -Red Theraband: pulling across body x10 -grasping cones, x10 and stacking/unstacking them  PATIENT EDUCATION: Education details: Reviewed HEP Person educated: Patient and Spouse Education method: Explanation, Demonstration, and Handouts Education comprehension: verbalized understanding and returned demonstration  HOME EXERCISE PROGRAM: 12/27: AA/ROM 12/29: A/ROM of shoulder, wrist, and fingers 1/3: Digit ROM   GOALS: Goals reviewed with patient? Yes  SHORT TERM GOALS: Target date: 05/15/22  Pt will be educated on HEP to improve ability to actively use RUE during functional task completion.  Goal status: IN PROGRESS  2.  Pt will increase A/ROM in RUE to [facility] in order to improve ability to perform dressing tasks with minimal compensatory strategies.  Goal status: IN PROGRESS  3.  Pt will increase strength in the RUE to 4-/5 in order to improve ability to actively assist the LUE in lifting tasks required during grooming and bathing.  Goal status: IN PROGRESS  4.  Pt will decrease pain in the RUE to 2/10 or less in order to sleep for 3+ consecutive hours without waking due to pain.  Goal status: IN PROGRESS  5.  Pt will be educated in desensitization techniques to improve tolerance to clothing on the RUE.  Goal status: IN PROGRESS  6.  Pt will increase ROM in right digits to improve ability to form a full grasp required for holding items. Goal status: IN PROGRESS  LONG TERM GOALS: Target date: 06/05/22  Pt will be educated on and demonstrate independence in use of AE during meal preparation tasks, as well as  self feeding as needed. Goal status: IN PROGRESS  2.  Pt will improve strength in RUE to 4+/5 or better in order to independently complete lifting tasks required by cooking and cleaning activities.  Goal status: IN PROGRESS  3.  Pt will increase fine motor coordination in RUE by completing 9 hole peg test in under 1 min to improve ability to perform dressing tasks including operating buttons and zippers.  Goal status: IN PROGRESS  4.  Pt will increase right grip strength by 10# and pinch strength by 3# to improve ability to grasp and hold pots and pans during meal preparation. Goal status: IN PROGRESS   ASSESSMENT:  CLINICAL  IMPRESSION: This session focused more on smaller/fine motor tasks. Due to limited coordination and fine motor abilities, he required increased time and initially mod A for digit ROM and peg board task. After a few repetitions and further education for compensatory strategies, he was able to complete tasks  independently. During peg board task he realized that he can pick up the pegs, place them on the board, then rearrange his grip to be able to hold on to the pegs in a more efficient way to place them on the board. OT providing up to mod A throughout session, as well as verbal and tactile cuing for technique and positioning.    PLAN:  OT FREQUENCY: 2x/week  OT DURATION: 6 weeks  PLANNED INTERVENTIONS: self care/ADL training, therapeutic exercise, therapeutic activity, neuromuscular re-education, manual therapy, passive range of motion, functional mobility training, electrical stimulation, ultrasound, moist heat, cryotherapy, patient/family education, cognitive remediation/compensation, and DME and/or AE instructions  RECOMMENDED OTHER SERVICES: PT and Speech  CONSULTED AND AGREED WITH PLAN OF CARE: Patient and family member/caregiver  PLAN FOR NEXT SESSION: P/ROM, AA/ROM, A/ROM, E-stim, closed chain exercises, weight bearing, gripping tasks, supine AA/ROM  Paulita Fujita, OTR/L [place], [place] 05/06/2022, 8:59 AM

## 2022-05-06 NOTE — Telephone Encounter (Signed)
*  STAT* If patient is at the pharmacy, call can be transferred to refill team.   1. Which medications need to be refilled? (please list name of each medication and dose if known) Evolocumab (REPATHA SURECLICK) 500 MG/ML SOAJ   2. Which pharmacy/location (including street and city if local pharmacy) is medication to be sent to?  [place], [place] [address]    3. Do they need a 30 day or 90 day supply? 90   Pharmacy states they didn't get the prescription prior.

## 2022-05-06 NOTE — Therapy (Signed)
OUTPATIENT OCCUPATIONAL THERAPY NEURO TREATMENT NOTE  Patient Name: Terry Mayo MRN: [id number] DOB:18-May-1955, 67 y.o., male Today's Date: 05/01/2022  PCP: Annye Asa, MD REFERRING PROVIDER: Annye Asa, MD  END OF SESSION:  END OF SESSION:  OT End of Session - 05/06/22 1114     Visit Number 6    Number of Visits 13    Date for OT Re-Evaluation 06/05/22    Authorization Type [employer], $20 copay    Authorization - Visit Number 5    Authorization - Number of Visits 12    Progress Note Due on Visit 10    OT Start Time 1035    OT Stop Time 1113    OT Time Calculation (min) 38 min    Activity Tolerance Patient tolerated treatment well    Behavior During Therapy WFL for tasks assessed/performed              Past Medical History:  Diagnosis Date   Arthritis    Essential hypertension    Hyperlipidemia    Kidney stones    MI (myocardial infarction) (HCC)    Stroke [facility])    Past Surgical History:  Procedure Laterality Date   CHOLECYSTECTOMY  1990s   COLONOSCOPY WITH PROPOFOL N/A 09/30/2015   Procedure: COLONOSCOPY WITH PROPOFOL;  Surgeon: Corbin Ade, MD;  Location: [facility];  Service: Endoscopy;  Laterality: N/A;  930   IR CT HEAD LTD  12/10/2021   IR PERCUTANEOUS ART THROMBECTOMY/INFUSION INTRACRANIAL INC DIAG ANGIO  12/10/2021   IR US GUIDE VASC ACCESS RIGHT  12/10/2021   KIDNEY STONE SURGERY  2005   LEFT HEART CATH AND CORONARY ANGIOGRAPHY N/A 12/04/2021   Procedure: LEFT HEART CATH AND CORONARY ANGIOGRAPHY;  Surgeon: Lyn Records, MD;  Location: [facility] INV[facility] CV LAB;  Service: Cardiovascular;  Laterality: N/A;   LOOP RECORDER INSERTION N/A 12/16/2021   Procedure: LOOP RECORDER INSERTION;  Surgeon: Regan Lemming, MD;  Location: [facility] CV LAB;  Service: Cardiovascular;  Laterality: N/A;   RADIOLOGY WITH ANESTHESIA N/A 12/10/2021   Procedure: IR WITH ANESTHESIA;  Surgeon: Julieanne Cotton, MD;  Location: [facility] OR;  Service: Radiology;   Laterality: N/A;   Patient Active Problem List   Diagnosis Date Noted   Constipation 05/05/2022   Esophagitis 12/26/2021   Right hemiplegia (HCC) 12/17/2021   History of ischemic left ICA stroke 12/10/2021   Acute left ICA ischemic stroke (HCC) 12/10/2021   CAD (coronary artery disease) 12/05/2021   Ischemic cardiomyopathy 12/05/2021   NSVT (nonsustained ventricular tachycardia) (HCC) 12/05/2021   Hypertension 12/05/2021   Hyperlipidemia 12/05/2021   NSTEMI (non-ST elevated myocardial infarction) (HCC) 12/04/2021   Long term (current) use of antithrombotics/antiplatelets    Loss of weight 09/04/2015   Encounter for screening colonoscopy 09/04/2015    ONSET DATE: 12/10/21  REFERRING DIAG: L CVA  THERAPY DIAG:  Other lack of coordination  Other symptoms and signs involving the nervous system  Rationale for Evaluation and Treatment: Rehabilitation  SUBJECTIVE:   SUBJECTIVE STATEMENT: "I've been trying to move my arm."  PERTINENT HISTORY: Pt had a heart attack and was hospitalized. Once he discharged home a few days, he presented with slurred speech, balance deficits, and R sided weakness. Pt diagnosed with L ischemic Stroke. He went to [facility] for Rehab for several weeks.  PRECAUTIONS: Fall  WEIGHT BEARING RESTRICTIONS: No  PAIN:  Are you having pain? Yes: NPRS scale: 5/10 Pain location: right wrist Pain description: aching Aggravating factors: moving it  a lot Relieving factors: resting it  FALLS: Has patient fallen in last 6 months? Yes. Number of falls 1 - when he had the stroke  LIVING ENVIRONMENT: Lives with: lives with their spouse Lives in: House/apartment Stairs: Yes: External: 1 steps; none Has following equipment at home: shower chair, bed side commode, and Grab bars  PLOF: Independent  PATIENT GOALS: To get the hand working "right"  OBJECTIVE:   HAND DOMINANCE: Left  ADLs: Overall ADLs: Pt reports difficulty with dressing, unable to do any  fasteners/zippers, Pt unable to shave or fix his hair, pt unable to do any IADL's at this time. Per wife's report she helps pt around 90% with all of his daily care.   MOBILITY STATUS: Independent  POSTURE COMMENTS:  No Significant postural limitations Sitting balance: Moves/returns truncal midpoint >2 inches in all planes  ACTIVITY TOLERANCE: Activity tolerance: Pt having some increased fatigue.  FUNCTIONAL OUTCOME MEASURES: Quick Dash: 85.0%  UPPER EXTREMITY ROM:    Passive ROM Right eval  Shoulder flexion 100  Shoulder abduction 89  Shoulder internal rotation 90  Shoulder external rotation 41  Elbow flexion 139  Elbow extension 8  Wrist flexion 60  Wrist extension 32  Wrist ulnar deviation 13  Wrist radial deviation 28  Wrist pronation 63  Wrist supination WFL  (Blank rows = not tested)   Active ROM Right eval  Shoulder flexion 40  Shoulder abduction 56  Shoulder internal rotation 90  Shoulder external rotation 30  Elbow flexion 122  Elbow extension 6  Wrist flexion 54  Wrist extension 10  Wrist ulnar deviation 10  Wrist radial deviation 17  Wrist pronation 70  Wrist supination WFL  (Blank rows = not tested) UPPER EXTREMITY MMT:     MMT Right eval  Shoulder flexion   Shoulder abduction   Shoulder adduction   Shoulder extension   Shoulder internal rotation   Shoulder external rotation   Middle trapezius   Lower trapezius   Elbow flexion   Elbow extension   Wrist flexion   Wrist extension   Wrist ulnar deviation   Wrist radial deviation   Wrist pronation   Wrist supination   (Blank rows = not tested)  HAND FUNCTION: Grip strength: Right: -- lbs; Left: -- lbs, Lateral pinch: Right: -- lbs, Left: -- lbs, and 3 point pinch: Right: -- lbs, Left: -- lbs  COORDINATION: Box and Blocks:  Right --blocks, Left --blocks  SENSATION: Light touch: Impaired   EDEMA: Moderate swelling noted in the Right hand and wrist  MUSCLE TONE: RUE:  Mild  COGNITION: Overall cognitive status: Impaired  VISION: Subjective report: With onset of CVA pt reports blurriness Baseline vision:  Supposed to wear glasses, does not have them anymore Visual history:  No pertinent history  VISION ASSESSMENT: Reading acuity: Some blurriness in the beginning, now he reports at his baseline.  PERCEPTION: WFL  PRAXIS: Impaired: Motor planning  OBSERVATIONS: Limited coordination and ROM in the RUE   TODAY'S TREATMENT:  DATE:  05/06/22 -P/ROM: shoulder flexion, shoulder abduction, er/IR, elbow flexion/extension, wrist flexion/extension, wrist ulnar/radial deviation, supination/pronation, digit composite flexion/extension, x10 -Holding small foam roll between BUE, pt completing protraction, flexion, 10X, able to readjust his hands on the foam roll independently with increased time.  -A/ROM: standing-elbow flexion and extension 10 reps -A/ROM: standing-OT supporting intermittently at elbow, pt working on supination and pronation, loses strength with pronation; 10 reps -Composite digit flexion/extension, 10 reps -UBE: Level 1, vertical handle, 2' forward, intermittent stop/starts  05/01/22 -P/ROM: shoulder flexion, shoulder abduction, wrist flexion/extension, wrist ulnar/radial deviation, supination/pronation, digit composite flexion/extension, x10 -Digit ROM: composite flexion (indep), abduction/adduction (mod A), x10 -Large peg board: reaching into container and grabbing 1 peg at a time and placing them in the peg board x8 (initially requiring mod A before improving to indep) -A/ROM: shoulder flexion, abduction, protraction, x10  04/29/22 -Supine exercises: 2lb wrist weight donned, foam roll in between BUE, chest press, flexion, horizontal abduction, x10 each -A/ROM: Supine, 2lb  wrist weight donned, shoulder flexion, abduction,  protraction, er/IR, x10 -pinch strength: yellow resistance clip, lateral pinch x10 -Wrist A/ROM: flexion/extension in neutral, ulnar/radial deviation, supination/pronation, x10 -Theraputty: working on rolling into a ball and squeezing, working on moving thumb in abduction to better grip the putty and work on full squeezes -digit ROM: digit composite flexion, abduction/adduction, x10, mod assist from OT   PATIENT EDUCATION: Education details: Reviewed HEP Person educated: Patient and Spouse Education method: Explanation, Demonstration, and Handouts Education comprehension: verbalized understanding and returned demonstration  HOME EXERCISE PROGRAM: 12/27: AA/ROM 12/29: A/ROM of shoulder, wrist, and fingers 1/3: Digit ROM   GOALS: Goals reviewed with patient? Yes  SHORT TERM GOALS: Target date: 05/15/22  Pt will be educated on HEP to improve ability to actively use RUE during functional task completion.  Goal status: IN PROGRESS  2.  Pt will increase A/ROM in RUE to [facility] in order to improve ability to perform dressing tasks with minimal compensatory strategies.  Goal status: IN PROGRESS  3.  Pt will increase strength in the RUE to 4-/5 in order to improve ability to actively assist the LUE in lifting tasks required during grooming and bathing.  Goal status: IN PROGRESS  4.  Pt will decrease pain in the RUE to 2/10 or less in order to sleep for 3+ consecutive hours without waking due to pain.  Goal status: IN PROGRESS  5.  Pt will be educated in desensitization techniques to improve tolerance to clothing on the RUE.  Goal status: IN PROGRESS  6.  Pt will increase ROM in right digits to improve ability to form a full grasp required for holding items. Goal status: IN PROGRESS  LONG TERM GOALS: Target date: 06/05/22  Pt will be educated on and demonstrate independence in use of AE during meal preparation tasks, as well as self feeding as needed. Goal status: IN PROGRESS  2.  Pt  will improve strength in RUE to 4+/5 or better in order to independently complete lifting tasks required by cooking and cleaning activities.  Goal status: IN PROGRESS  3.  Pt will increase fine motor coordination in RUE by completing 9 hole peg test in under 1 min to improve ability to perform dressing tasks including operating buttons and zippers.  Goal status: IN PROGRESS  4.  Pt will increase right grip strength by 10# and pinch strength by 3# to improve ability to grasp and hold pots and pans during meal preparation. Goal status: IN PROGRESS   ASSESSMENT:  CLINICAL IMPRESSION: Pt reporting he cannot lift his arm up, is completing some HEP tasks. Began session with P/ROM, pt with some pain in the shoulder at approximately 50% range. Pt holding bolster between hands and was able to complete shoulder ROM tasks, then completed elbow and forearm A/ROM in standing with minimal difficulty. Discussed continuing with HEP at home to build up strength and motor planning capabilities as pt seems to perceive that cannot do as much as he actually can. Trialed UBE, pt working on motor planning for the task and sustained grip. Verbal cuing, visual demonstration, and tactile cuing intermittently for form and technique.    PLAN:  OT FREQUENCY: 2x/week  OT DURATION: 6 weeks  PLANNED INTERVENTIONS: self care/ADL training, therapeutic exercise, therapeutic activity, neuromuscular re-education, manual therapy, passive range of motion, functional mobility training, electrical stimulation, ultrasound, moist heat, cryotherapy, patient/family education, cognitive remediation/compensation, and DME and/or AE instructions  RECOMMENDED OTHER SERVICES: PT and Speech  CONSULTED AND AGREED WITH PLAN OF CARE: Patient and family member/caregiver  PLAN FOR NEXT SESSION: P/ROM, AA/ROM, A/ROM, E-stim, closed chain exercises, weight bearing, gripping tasks, supine AA/ROM    Guadelupe Sabin, OTR/L   [phone] 05/06/2022, 11:14 AM

## 2022-05-07 NOTE — Progress Notes (Signed)
[facility] Summary Report / Loop Recorder 

## 2022-05-08 ENCOUNTER — Ambulatory Visit (HOSPITAL_COMMUNITY): Payer: Medicare HMO | Admitting: Occupational Therapy

## 2022-05-08 ENCOUNTER — Encounter (HOSPITAL_COMMUNITY): Payer: Self-pay | Admitting: Occupational Therapy

## 2022-05-08 DIAGNOSIS — R29818 Other symptoms and signs involving the nervous system: Secondary | ICD-10-CM

## 2022-05-08 DIAGNOSIS — R278 Other lack of coordination: Secondary | ICD-10-CM

## 2022-05-08 DIAGNOSIS — I69351 Hemiplegia and hemiparesis following cerebral infarction affecting right dominant side: Secondary | ICD-10-CM

## 2022-05-08 NOTE — Therapy (Unsigned)
OUTPATIENT OCCUPATIONAL THERAPY NEURO TREATMENT NOTE  Patient Name: Terry Mayo MRN: [id number] DOB:[birth year], 67 y.o., male Today's Date: 05/01/2022  PCP: Zebedee Iba, MD REFERRING PROVIDER: Zebedee Iba, MD  END OF SESSION:  END OF SESSION:  OT End of Session - 05/08/22 0954     Visit Number 7    Number of Visits 13    Date for OT Re-Evaluation 06/05/22    Authorization Type [employer], $20 copay    Authorization - Visit Number 6    Authorization - Number of Visits 12    Progress Note Due on Visit 10    OT Start Time 0950    OT Stop Time 1030    OT Time Calculation (min) 40 min    Activity Tolerance Patient tolerated treatment well    Behavior During Therapy WFL for tasks assessed/performed              Past Medical History:  Diagnosis Date   Arthritis    Essential hypertension    Hyperlipidemia    Kidney stones    MI (myocardial infarction) ([place])    Stroke [facility])    Past Surgical History:  Procedure Laterality Date   CHOLECYSTECTOMY  1990s   COLONOSCOPY WITH PROPOFOL N/A 09/30/2015   Procedure: COLONOSCOPY WITH PROPOFOL;  Surgeon: Daneil Dolin, MD;  [facility];  Service: Endoscopy;  Laterality: N/A;  930   IR CT HEAD LTD  12/10/2021   IR PERCUTANEOUS ART THROMBECTOMY/INFUSION INTRACRANIAL INC DIAG ANGIO  12/10/2021   IR US GUIDE VASC ACCESS RIGHT  12/10/2021   KIDNEY STONE SURGERY  2005   LEFT HEART CATH AND CORONARY ANGIOGRAPHY N/A 12/04/2021   Procedure: LEFT HEART CATH AND CORONARY ANGIOGRAPHY;  Surgeon: Belva Crome, MD;  Location: [place] CV LAB;  Service: Cardiovascular;  Laterality: N/A;   LOOP RECORDER INSERTION N/A 12/16/2021   Procedure: LOOP RECORDER INSERTION;  Surgeon: Constance Haw, MD;  Location: [place] CV LAB;  Service: Cardiovascular;  Laterality: N/A;   RADIOLOGY WITH ANESTHESIA N/A 12/10/2021   Procedure: IR WITH ANESTHESIA;  Surgeon: Luanne Bras, MD;  Location: [place];  Service: Radiology;   Laterality: N/A;   Patient Active Problem List   Diagnosis Date Noted   Constipation 05/05/2022   Esophagitis 12/26/2021   Right hemiplegia ([place]) 12/17/2021   History of ischemic left ICA stroke 12/10/2021   Acute left ICA ischemic stroke ([place]) 12/10/2021   CAD (coronary artery disease) 12/05/2021   Ischemic cardiomyopathy 12/05/2021   NSVT (nonsustained ventricular tachycardia) ([place]) 12/05/2021   Hypertension 12/05/2021   Hyperlipidemia 12/05/2021   NSTEMI (non-ST elevated myocardial infarction) ([place]) 12/04/2021   Long term (current) use of antithrombotics/antiplatelets    Loss of weight 09/04/2015   Encounter for screening colonoscopy 09/04/2015    ONSET DATE: 12/10/21  REFERRING DIAG: L CVA  THERAPY DIAG:  Other lack of coordination  Other symptoms and signs involving the nervous system  Hemiplegia and hemiparesis following cerebral infarction affecting right dominant side ([place])  Rationale for Evaluation and Treatment: Rehabilitation  SUBJECTIVE:   SUBJECTIVE STATEMENT: "Nothing is changing"  PERTINENT HISTORY: Pt had a heart attack and was hospitalized. Once he discharged home a few days, he presented with slurred speech, balance deficits, and R sided weakness. Pt diagnosed with L ischemic Stroke. He went to [facility] for Rehab for several weeks.  PRECAUTIONS: Fall  WEIGHT BEARING RESTRICTIONS: No  PAIN:  Are you having pain? Yes: NPRS scale: 5/10 Pain location: right  wrist Pain description: aching Aggravating factors: moving it a lot Relieving factors: resting it  FALLS: Has patient fallen in last 6 months? Yes. Number of falls 1 - when he had the stroke  LIVING ENVIRONMENT: Lives with: lives with their spouse Lives in: House/apartment Stairs: Yes: External: 1 steps; none Has following equipment at home: shower chair, bed side commode, and Grab bars  PLOF: Independent  PATIENT GOALS: To get the hand working "right"  OBJECTIVE:   HAND DOMINANCE:  Left  ADLs: Overall ADLs: Pt reports difficulty with dressing, unable to do any fasteners/zippers, Pt unable to shave or fix his hair, pt unable to do any IADL's at this time. Per wife's report she helps pt around 90% with all of his daily care.   MOBILITY STATUS: Independent  POSTURE COMMENTS:  No Significant postural limitations Sitting balance: Moves/returns truncal midpoint >2 inches in all planes  ACTIVITY TOLERANCE: Activity tolerance: Pt having some increased fatigue.  FUNCTIONAL OUTCOME MEASURES: Quick Dash: 85.0%  UPPER EXTREMITY ROM:    Passive ROM Right eval  Shoulder flexion 100  Shoulder abduction 89  Shoulder internal rotation 90  Shoulder external rotation 41  Elbow flexion 139  Elbow extension 8  Wrist flexion 60  Wrist extension 32  Wrist ulnar deviation 13  Wrist radial deviation 28  Wrist pronation 63  Wrist supination WFL  (Blank rows = not tested)   Active ROM Right eval  Shoulder flexion 40  Shoulder abduction 56  Shoulder internal rotation 90  Shoulder external rotation 30  Elbow flexion 122  Elbow extension 6  Wrist flexion 54  Wrist extension 10  Wrist ulnar deviation 10  Wrist radial deviation 17  Wrist pronation 70  Wrist supination WFL  (Blank rows = not tested) UPPER EXTREMITY MMT:     MMT Right eval  Shoulder flexion   Shoulder abduction   Shoulder adduction   Shoulder extension   Shoulder internal rotation   Shoulder external rotation   Middle trapezius   Lower trapezius   Elbow flexion   Elbow extension   Wrist flexion   Wrist extension   Wrist ulnar deviation   Wrist radial deviation   Wrist pronation   Wrist supination   (Blank rows = not tested)  HAND FUNCTION: Grip strength: Right: -- lbs; Left: -- lbs, Lateral pinch: Right: -- lbs, Left: -- lbs, and 3 point pinch: Right: -- lbs, Left: -- lbs  COORDINATION: Box and Blocks:  Right --blocks, Left --blocks  SENSATION: Light touch: Impaired   EDEMA:  Moderate swelling noted in the Right hand and wrist  MUSCLE TONE: RUE: Mild  COGNITION: Overall cognitive status: Impaired  VISION: Subjective report: With onset of CVA pt reports blurriness Baseline vision:  Supposed to wear glasses, does not have them anymore Visual history:  No pertinent history  VISION ASSESSMENT: Reading acuity: Some blurriness in the beginning, now he reports at his baseline.  PERCEPTION: WFL  PRAXIS: Impaired: Motor planning  OBSERVATIONS: Limited coordination and ROM in the RUE   TODAY'S TREATMENT:  DATE:  05/08/22 -Shoulder A/ROM: seated flexion, abduction, protraction x10 -Elbow and wrist A/ROM: elbow flexion/extension, wrist flexion/extension, supination pronation, x10 -Digit ROM: digit composite flexion, opposition  -attempted abduction/adduction and extension however required max assist to complete  -Weight bearing in quadruped:  -weight shifting 2x60"  -reaching and tapping, LUE x10, RUE x10 -attempted checker flipping - attempted for ~3 mins, unable to fully flip without max assist to maintain pinch on checker -Towel scrunches: x10  05/06/22 -P/ROM: shoulder flexion, shoulder abduction, er/IR, elbow flexion/extension, wrist flexion/extension, wrist ulnar/radial deviation, supination/pronation, digit composite flexion/extension, x10 -Holding small foam roll between BUE, pt completing protraction, flexion, 10X, able to readjust his hands on the foam roll independently with increased time.  -A/ROM: standing-elbow flexion and extension 10 reps -A/ROM: standing-OT supporting intermittently at elbow, pt working on supination and pronation, loses strength with pronation; 10 reps -Composite digit flexion/extension, 10 reps -UBE: Level 1, vertical handle, 2' forward, intermittent stop/starts  05/01/22 -P/ROM: shoulder flexion,  shoulder abduction, wrist flexion/extension, wrist ulnar/radial deviation, supination/pronation, digit composite flexion/extension, x10 -Digit ROM: composite flexion (indep), abduction/adduction (mod A), x10 -Large peg board: reaching into container and grabbing 1 peg at a time and placing them in the peg board x8 (initially requiring mod A before improving to indep) -A/ROM: shoulder flexion, abduction, protraction, x10    PATIENT EDUCATION: Education details: Weight bearing Person educated: Patient and Spouse Education method: Explanation, Demonstration, and Handouts Education comprehension: verbalized understanding and returned demonstration  HOME EXERCISE PROGRAM: 12/27: AA/ROM 12/29: A/ROM of shoulder, wrist, and fingers 1/3: Digit ROM 1/14: Weight bearing   GOALS: Goals reviewed with patient? Yes  SHORT TERM GOALS: Target date: 05/15/22  Pt will be educated on HEP to improve ability to actively use RUE during functional task completion.  Goal status: IN PROGRESS  2.  Pt will increase A/ROM in RUE to [facility] in order to improve ability to perform dressing tasks with minimal compensatory strategies.  Goal status: IN PROGRESS  3.  Pt will increase strength in the RUE to 4-/5 in order to improve ability to actively assist the LUE in lifting tasks required during grooming and bathing.  Goal status: IN PROGRESS  4.  Pt will decrease pain in the RUE to 2/10 or less in order to sleep for 3+ consecutive hours without waking due to pain.  Goal status: IN PROGRESS  5.  Pt will be educated in desensitization techniques to improve tolerance to clothing on the RUE.  Goal status: IN PROGRESS  6.  Pt will increase ROM in right digits to improve ability to form a full grasp required for holding items. Goal status: IN PROGRESS  LONG TERM GOALS: Target date: 06/05/22  Pt will be educated on and demonstrate independence in use of AE during meal preparation tasks, as well as self feeding as  needed. Goal status: IN PROGRESS  2.  Pt will improve strength in RUE to 4+/5 or better in order to independently complete lifting tasks required by cooking and cleaning activities.  Goal status: IN PROGRESS  3.  Pt will increase fine motor coordination in RUE by completing 9 hole peg test in under 1 min to improve ability to perform dressing tasks including operating buttons and zippers.  Goal status: IN PROGRESS  4.  Pt will increase right grip strength by 10# and pinch strength by 3# to improve ability to grasp and hold pots and pans during meal preparation. Goal status: IN PROGRESS   ASSESSMENT:  CLINICAL IMPRESSION: Pt at times  presents with self limiting behaviors, stating that he can't do things, however once asked, he is able to do it. This session focused on active movements and beginning to encourage fine motor movements. He was able to get into quadruped this session and work on reaching and weight shifting for proprioceptive input into his RUE. OT providing intermittent mod assist and verbal/tactile cuing for technique.    PLAN:  OT FREQUENCY: 2x/week  OT DURATION: 6 weeks  PLANNED INTERVENTIONS: self care/ADL training, therapeutic exercise, therapeutic activity, neuromuscular re-education, manual therapy, passive range of motion, functional mobility training, electrical stimulation, ultrasound, moist heat, cryotherapy, patient/family education, cognitive remediation/compensation, and DME and/or AE instructions  RECOMMENDED OTHER SERVICES: PT and Speech  CONSULTED AND AGREED WITH PLAN OF CARE: Patient and family member/caregiver  PLAN FOR NEXT SESSION: P/ROM, AA/ROM, A/ROM, E-stim, closed chain exercises, weight bearing, gripping tasks, supine AA/ROM    Trish Mage, OTR/L [phone] 05/08/2022, 9:55 AM

## 2022-05-12 ENCOUNTER — Encounter (HOSPITAL_COMMUNITY): Payer: Self-pay | Admitting: Occupational Therapy

## 2022-05-12 ENCOUNTER — Ambulatory Visit (HOSPITAL_COMMUNITY): Payer: Medicare HMO | Admitting: Occupational Therapy

## 2022-05-12 DIAGNOSIS — R278 Other lack of coordination: Secondary | ICD-10-CM | POA: Diagnosis not present

## 2022-05-12 DIAGNOSIS — R29818 Other symptoms and signs involving the nervous system: Secondary | ICD-10-CM

## 2022-05-12 DIAGNOSIS — I69351 Hemiplegia and hemiparesis following cerebral infarction affecting right dominant side: Secondary | ICD-10-CM

## 2022-05-12 NOTE — Therapy (Signed)
OUTPATIENT OCCUPATIONAL THERAPY NEURO TREATMENT NOTE  Patient Name: Terry Mayo MRN: [id number] DOB:1955/05/09, 67 y.o., male Today's Date: 05/01/2022  PCP: Zebedee Iba, MD REFERRING PROVIDER: Zebedee Iba, MD  END OF SESSION:  END OF SESSION:  OT End of Session - 05/12/22 1043     Visit Number 8    Number of Visits 13    Date for OT Re-Evaluation 06/05/22    Authorization Type [employer], $20 copay    Authorization - Visit Number 7    Authorization - Number of Visits 12    Progress Note Due on Visit 10    OT Start Time 1040    OT Stop Time 1118    OT Time Calculation (min) 38 min    Activity Tolerance Patient tolerated treatment well    Behavior During Therapy WFL for tasks assessed/performed              Past Medical History:  Diagnosis Date   Arthritis    Essential hypertension    Hyperlipidemia    Kidney stones    MI (myocardial infarction) ([place])    Stroke [facility])    Past Surgical History:  Procedure Laterality Date   CHOLECYSTECTOMY  1990s   COLONOSCOPY WITH PROPOFOL N/A 09/30/2015   Procedure: COLONOSCOPY WITH PROPOFOL;  Surgeon: Daneil Dolin, MD;  [facility];  Service: Endoscopy;  Laterality: N/A;  930   IR CT HEAD LTD  12/10/2021   IR PERCUTANEOUS ART THROMBECTOMY/INFUSION INTRACRANIAL INC DIAG ANGIO  12/10/2021   IR US GUIDE VASC ACCESS RIGHT  12/10/2021   KIDNEY STONE SURGERY  2005   LEFT HEART CATH AND CORONARY ANGIOGRAPHY N/A 12/04/2021   Procedure: LEFT HEART CATH AND CORONARY ANGIOGRAPHY;  Surgeon: Belva Crome, MD;  Location: [place] CV LAB;  Service: Cardiovascular;  Laterality: N/A;   LOOP RECORDER INSERTION N/A 12/16/2021   Procedure: LOOP RECORDER INSERTION;  Surgeon: Constance Haw, MD;  Location: [place] CV LAB;  Service: Cardiovascular;  Laterality: N/A;   RADIOLOGY WITH ANESTHESIA N/A 12/10/2021   Procedure: IR WITH ANESTHESIA;  Surgeon: Luanne Bras, MD;  Location: [place];  Service: Radiology;   Laterality: N/A;   Patient Active Problem List   Diagnosis Date Noted   Constipation 05/05/2022   Esophagitis 12/26/2021   Right hemiplegia ([place]) 12/17/2021   History of ischemic left ICA stroke 12/10/2021   Acute left ICA ischemic stroke ([place]) 12/10/2021   CAD (coronary artery disease) 12/05/2021   Ischemic cardiomyopathy 12/05/2021   NSVT (nonsustained ventricular tachycardia) ([place]) 12/05/2021   Hypertension 12/05/2021   Hyperlipidemia 12/05/2021   NSTEMI (non-ST elevated myocardial infarction) ([place]) 12/04/2021   Long term (current) use of antithrombotics/antiplatelets    Loss of weight 09/04/2015   Encounter for screening colonoscopy 09/04/2015    ONSET DATE: 12/10/21  REFERRING DIAG: L CVA  THERAPY DIAG:  Other lack of coordination  Other symptoms and signs involving the nervous system  Hemiplegia and hemiparesis following cerebral infarction affecting right dominant side ([place])  Rationale for Evaluation and Treatment: Rehabilitation  SUBJECTIVE:   SUBJECTIVE STATEMENT: "I can't do my exercises because my girlfriend can't help me."  PERTINENT HISTORY: Pt had a heart attack and was hospitalized. Once he discharged home a few days, he presented with slurred speech, balance deficits, and R sided weakness. Pt diagnosed with L ischemic Stroke. He went to [facility] for Rehab for several weeks.  PRECAUTIONS: Fall  WEIGHT BEARING RESTRICTIONS: No  PAIN:  Are you having  pain? Yes: NPRS scale: 5/10 Pain location: right wrist Pain description: aching Aggravating factors: moving it a lot Relieving factors: resting it  FALLS: Has patient fallen in last 6 months? Yes. Number of falls 1 - when he had the stroke  LIVING ENVIRONMENT: Lives with: lives with their spouse Lives in: House/apartment Stairs: Yes: External: 1 steps; none Has following equipment at home: shower chair, bed side commode, and Grab bars  PLOF: Independent  PATIENT GOALS: To get the hand working  "right"  OBJECTIVE:   HAND DOMINANCE: Left  ADLs: Overall ADLs: Pt reports difficulty with dressing, unable to do any fasteners/zippers, Pt unable to shave or fix his hair, pt unable to do any IADL's at this time. Per wife's report she helps pt around 90% with all of his daily care.   MOBILITY STATUS: Independent  POSTURE COMMENTS:  No Significant postural limitations Sitting balance: Moves/returns truncal midpoint >2 inches in all planes  ACTIVITY TOLERANCE: Activity tolerance: Pt having some increased fatigue.  FUNCTIONAL OUTCOME MEASURES: Quick Dash: 85.0%  UPPER EXTREMITY ROM:    Passive ROM Right eval  Shoulder flexion 100  Shoulder abduction 89  Shoulder internal rotation 90  Shoulder external rotation 41  Elbow flexion 139  Elbow extension 8  Wrist flexion 60  Wrist extension 32  Wrist ulnar deviation 13  Wrist radial deviation 28  Wrist pronation 63  Wrist supination WFL  (Blank rows = not tested)   Active ROM Right eval  Shoulder flexion 40  Shoulder abduction 56  Shoulder internal rotation 90  Shoulder external rotation 30  Elbow flexion 122  Elbow extension 6  Wrist flexion 54  Wrist extension 10  Wrist ulnar deviation 10  Wrist radial deviation 17  Wrist pronation 70  Wrist supination WFL  (Blank rows = not tested) UPPER EXTREMITY MMT:     MMT Right eval  Shoulder flexion   Shoulder abduction   Shoulder adduction   Shoulder extension   Shoulder internal rotation   Shoulder external rotation   Middle trapezius   Lower trapezius   Elbow flexion   Elbow extension   Wrist flexion   Wrist extension   Wrist ulnar deviation   Wrist radial deviation   Wrist pronation   Wrist supination   (Blank rows = not tested)  HAND FUNCTION: Grip strength: Right: -- lbs; Left: -- lbs, Lateral pinch: Right: -- lbs, Left: -- lbs, and 3 point pinch: Right: -- lbs, Left: -- lbs  COORDINATION: Box and Blocks:  Right --blocks, Left  --blocks  SENSATION: Light touch: Impaired   EDEMA: Moderate swelling noted in the Right hand and wrist  MUSCLE TONE: RUE: Mild  COGNITION: Overall cognitive status: Impaired  VISION: Subjective report: With onset of CVA pt reports blurriness Baseline vision:  Supposed to wear glasses, does not have them anymore Visual history:  No pertinent history  VISION ASSESSMENT: Reading acuity: Some blurriness in the beginning, now he reports at his baseline.  PERCEPTION: WFL  PRAXIS: Impaired: Motor planning  OBSERVATIONS: Limited coordination and ROM in the RUE   TODAY'S TREATMENT:  DATE:  05/12/22 -P/ROM: Wrist flexion/extension, supination/pronation, digit composite flexion, x10 -A/ROM: Wrist flexion/extension, supination/pronation, digit composite flexion, x10 -Weight bearing in quadruped:  -weight shifting 2x60"  -Weight on RUE, Reaching forward with LUE to place large pegs on a peg board in front of him -E-Stim: [place] setting, 34 amps, 10/10 pattern, 10 mins, wrist extension. Last 4 mins working on digit composite flexion while in wrist extension and digit extension while in wrist flexion.  05/08/22 -Shoulder A/ROM: seated flexion, abduction, protraction x10 -Elbow and wrist A/ROM: elbow flexion/extension, wrist flexion/extension, supination pronation, x10 -Digit ROM: digit composite flexion, opposition  -attempted abduction/adduction and extension however required max assist to complete  -Weight bearing in quadruped:  -weight shifting 2x60"  -reaching and tapping, LUE x10, RUE x10 -attempted checker flipping - attempted for ~3 mins, unable to fully flip without max assist to maintain pinch on checker -Towel scrunches: x10  05/06/22 -P/ROM: shoulder flexion, shoulder abduction, er/IR, elbow flexion/extension, wrist flexion/extension, wrist  ulnar/radial deviation, supination/pronation, digit composite flexion/extension, x10 -Holding small foam roll between BUE, pt completing protraction, flexion, 10X, able to readjust his hands on the foam roll independently with increased time.  -A/ROM: standing-elbow flexion and extension 10 reps -A/ROM: standing-OT supporting intermittently at elbow, pt working on supination and pronation, loses strength with pronation; 10 reps -Composite digit flexion/extension, 10 reps -UBE: Level 1, vertical handle, 2' forward, intermittent stop/starts    PATIENT EDUCATION: Education details: Reviewed HEP Person educated: Patient and Spouse Education method: Explanation, Demonstration, and Handouts Education comprehension: verbalized understanding and returned demonstration  HOME EXERCISE PROGRAM: 12/27: AA/ROM 12/29: A/ROM of shoulder, wrist, and fingers 1/3: Digit ROM 1/14: Weight bearing   GOALS: Goals reviewed with patient? Yes  SHORT TERM GOALS: Target date: 05/15/22  Pt will be educated on HEP to improve ability to actively use RUE during functional task completion.  Goal status: IN PROGRESS  2.  Pt will increase A/ROM in RUE to [facility] in order to improve ability to perform dressing tasks with minimal compensatory strategies.  Goal status: IN PROGRESS  3.  Pt will increase strength in the RUE to 4-/5 in order to improve ability to actively assist the LUE in lifting tasks required during grooming and bathing.  Goal status: IN PROGRESS  4.  Pt will decrease pain in the RUE to 2/10 or less in order to sleep for 3+ consecutive hours without waking due to pain.  Goal status: IN PROGRESS  5.  Pt will be educated in desensitization techniques to improve tolerance to clothing on the RUE.  Goal status: IN PROGRESS  6.  Pt will increase ROM in right digits to improve ability to form a full grasp required for holding items. Goal status: IN PROGRESS  LONG TERM GOALS: Target date: 06/05/22  Pt  will be educated on and demonstrate independence in use of AE during meal preparation tasks, as well as self feeding as needed. Goal status: IN PROGRESS  2.  Pt will improve strength in RUE to 4+/5 or better in order to independently complete lifting tasks required by cooking and cleaning activities.  Goal status: IN PROGRESS  3.  Pt will increase fine motor coordination in RUE by completing 9 hole peg test in under 1 min to improve ability to perform dressing tasks including operating buttons and zippers.  Goal status: IN PROGRESS  4.  Pt will increase right grip strength by 10# and pinch strength by 3# to improve ability to grasp and hold pots and pans during  meal preparation. Goal status: IN PROGRESS   ASSESSMENT:  CLINICAL IMPRESSION: This session pt reported how he was unable to do his HEP at home due to his girlfriend being unable to assist. OT provided education to pt about how to complete all of his HEP independently and pt was able to demonstrate. He continued with weight bearing this session for proprioceptive input, along with starting E-stim for wrist flexion, as this is one of his most limited A/ROMs'. He tolerated both well, with OT providing support at the elbow and hand during weight bearing and at the elbow and forearm for e-stim. OT continuing to provide mod verbal and tactile cuing for positioning and technique.    PLAN:  OT FREQUENCY: 2x/week  OT DURATION: 6 weeks  PLANNED INTERVENTIONS: self care/ADL training, therapeutic exercise, therapeutic activity, neuromuscular re-education, manual therapy, passive range of motion, functional mobility training, electrical stimulation, ultrasound, moist heat, cryotherapy, patient/family education, cognitive remediation/compensation, and DME and/or AE instructions  RECOMMENDED OTHER SERVICES: PT and Speech  CONSULTED AND AGREED WITH PLAN OF CARE: Patient and family member/caregiver  PLAN FOR NEXT SESSION: P/ROM, AA/ROM, A/ROM,  E-stim, closed chain exercises, weight bearing, gripping tasks, supine AA/ROM    Trish Mage, OTR/L [phone] 05/12/2022, 10:44 AM

## 2022-05-14 ENCOUNTER — Encounter (HOSPITAL_COMMUNITY): Payer: Self-pay | Admitting: Occupational Therapy

## 2022-05-14 ENCOUNTER — Ambulatory Visit (HOSPITAL_COMMUNITY): Payer: Medicare HMO | Admitting: Occupational Therapy

## 2022-05-14 DIAGNOSIS — R278 Other lack of coordination: Secondary | ICD-10-CM | POA: Diagnosis not present

## 2022-05-14 DIAGNOSIS — R29818 Other symptoms and signs involving the nervous system: Secondary | ICD-10-CM

## 2022-05-14 DIAGNOSIS — I69351 Hemiplegia and hemiparesis following cerebral infarction affecting right dominant side: Secondary | ICD-10-CM

## 2022-05-14 NOTE — Therapy (Signed)
OUTPATIENT OCCUPATIONAL THERAPY NEURO TREATMENT NOTE  Patient Name: Terry Mayo MRN: [id number] DOB:26-Feb-1956, 67 y.o., male Today's Date: 05/01/2022  PCP: Zebedee Iba, MD REFERRING PROVIDER: Zebedee Iba, MD  END OF SESSION:  END OF SESSION:  OT End of Session - 05/14/22 1040     Visit Number 9    Number of Visits 13    Date for OT Re-Evaluation 06/05/22    Authorization Type [employer], $20 copay    Authorization - Visit Number 8    Authorization - Number of Visits 12    Progress Note Due on Visit 10    OT Start Time 1036    OT Stop Time 1115    OT Time Calculation (min) 39 min    Activity Tolerance Patient tolerated treatment well    Behavior During Therapy WFL for tasks assessed/performed              Past Medical History:  Diagnosis Date   Arthritis    Essential hypertension    Hyperlipidemia    Kidney stones    MI (myocardial infarction) ([place])    Stroke [facility])    Past Surgical History:  Procedure Laterality Date   CHOLECYSTECTOMY  1990s   COLONOSCOPY WITH PROPOFOL N/A 09/30/2015   Procedure: COLONOSCOPY WITH PROPOFOL;  Surgeon: Daneil Dolin, MD;  [facility];  Service: Endoscopy;  Laterality: N/A;  930   IR CT HEAD LTD  12/10/2021   IR PERCUTANEOUS ART THROMBECTOMY/INFUSION INTRACRANIAL INC DIAG ANGIO  12/10/2021   IR US GUIDE VASC ACCESS RIGHT  12/10/2021   KIDNEY STONE SURGERY  2005   LEFT HEART CATH AND CORONARY ANGIOGRAPHY N/A 12/04/2021   Procedure: LEFT HEART CATH AND CORONARY ANGIOGRAPHY;  Surgeon: Belva Crome, MD;  Location: [place] [facility];  Service: Cardiovascular;  Laterality: N/A;   LOOP RECORDER INSERTION N/A 12/16/2021   Procedure: LOOP RECORDER INSERTION;  Surgeon: Constance Haw, MD;  Location: [place] [facility];  Service: Cardiovascular;  Laterality: N/A;   RADIOLOGY WITH ANESTHESIA N/A 12/10/2021   Procedure: IR WITH ANESTHESIA;  Surgeon: Luanne Bras, MD;  Location: [place];  Service: Radiology;   Laterality: N/A;   Patient Active Problem List   Diagnosis Date Noted   Constipation 05/05/2022   Esophagitis 12/26/2021   Right hemiplegia ([place]) 12/17/2021   History of ischemic left ICA stroke 12/10/2021   Acute left ICA ischemic stroke ([place]) 12/10/2021   CAD (coronary artery disease) 12/05/2021   Ischemic cardiomyopathy 12/05/2021   NSVT (nonsustained ventricular tachycardia) ([place]) 12/05/2021   Hypertension 12/05/2021   Hyperlipidemia 12/05/2021   NSTEMI (non-ST elevated myocardial infarction) ([place]) 12/04/2021   Long term (current) use of antithrombotics/antiplatelets    Loss of weight 09/04/2015   Encounter for screening colonoscopy 09/04/2015    ONSET DATE: 12/10/21  REFERRING DIAG: L CVA  THERAPY DIAG:  Other lack of coordination  Other symptoms and signs involving the nervous system  Hemiplegia and hemiparesis following cerebral infarction affecting right dominant side ([place])  Rationale for Evaluation and Treatment: Rehabilitation  SUBJECTIVE:   SUBJECTIVE STATEMENT: "I can't lift my arm"  PERTINENT HISTORY: Pt had a heart attack and was hospitalized. Once he discharged home a few days, he presented with slurred speech, balance deficits, and R sided weakness. Pt diagnosed with L ischemic Stroke. He went to [facility] for Rehab for several weeks.  PRECAUTIONS: Fall  WEIGHT BEARING RESTRICTIONS: No  PAIN:  Are you having pain? Yes: NPRS scale: 5/10 Pain  location: right wrist Pain description: aching Aggravating factors: moving it a lot Relieving factors: resting it  FALLS: Has patient fallen in last 6 months? Yes. Number of falls 1 - when he had the stroke  LIVING ENVIRONMENT: Lives with: lives with their spouse Lives in: House/apartment Stairs: Yes: External: 1 steps; none Has following equipment at home: shower chair, bed side commode, and Grab bars  PLOF: Independent  PATIENT GOALS: To get the hand working "right"  OBJECTIVE:   HAND DOMINANCE:  Left  ADLs: Overall ADLs: Pt reports difficulty with dressing, unable to do any fasteners/zippers, Pt unable to shave or fix his hair, pt unable to do any IADL's at this time. Per wife's report she helps pt around 90% with all of his daily care.   MOBILITY STATUS: Independent  POSTURE COMMENTS:  No Significant postural limitations Sitting balance: Moves/returns truncal midpoint >2 inches in all planes  ACTIVITY TOLERANCE: Activity tolerance: Pt having some increased fatigue.  FUNCTIONAL OUTCOME MEASURES: Quick Dash: 85.0%  UPPER EXTREMITY ROM:    Passive ROM Right eval  Shoulder flexion 100  Shoulder abduction 89  Shoulder internal rotation 90  Shoulder external rotation 41  Elbow flexion 139  Elbow extension 8  Wrist flexion 60  Wrist extension 32  Wrist ulnar deviation 13  Wrist radial deviation 28  Wrist pronation 63  Wrist supination WFL  (Blank rows = not tested)   Active ROM Right eval  Shoulder flexion 40  Shoulder abduction 56  Shoulder internal rotation 90  Shoulder external rotation 30  Elbow flexion 122  Elbow extension 6  Wrist flexion 54  Wrist extension 10  Wrist ulnar deviation 10  Wrist radial deviation 17  Wrist pronation 70  Wrist supination WFL  (Blank rows = not tested) UPPER EXTREMITY MMT:     MMT Right eval  Shoulder flexion   Shoulder abduction   Shoulder adduction   Shoulder extension   Shoulder internal rotation   Shoulder external rotation   Middle trapezius   Lower trapezius   Elbow flexion   Elbow extension   Wrist flexion   Wrist extension   Wrist ulnar deviation   Wrist radial deviation   Wrist pronation   Wrist supination   (Blank rows = not tested)  HAND FUNCTION: Grip strength: Right: -- lbs; Left: -- lbs, Lateral pinch: Right: -- lbs, Left: -- lbs, and 3 point pinch: Right: -- lbs, Left: -- lbs  COORDINATION: Box and Blocks:  Right --blocks, Left --blocks  SENSATION: Light touch: Impaired   EDEMA:  Moderate swelling noted in the Right hand and wrist  MUSCLE TONE: RUE: Mild  COGNITION: Overall cognitive status: Impaired  VISION: Subjective report: With onset of CVA pt reports blurriness Baseline vision:  Supposed to wear glasses, does not have them anymore Visual history:  No pertinent history  VISION ASSESSMENT: Reading acuity: Some blurriness in the beginning, now he reports at his baseline.  PERCEPTION: WFL  PRAXIS: Impaired: Motor planning  OBSERVATIONS: Limited coordination and ROM in the RUE   TODAY'S TREATMENT:  DATE:  05/14/22 -A/ROM: Wrist flexion/extension, supination/pronation, digit composite flexion, x10 -E-Stim: [place] setting, 44 amps, 10/10 pattern, 10 mins, wrist extension. Last 4 mins working on digit composite flexion while in wrist flexion -Towel scrunches: x10 -Holding small foam roll between BUE, pt completing protraction, flexion, x15, able to readjust his hands on the foam roll independently with increased time.   05/12/22 -P/ROM: Wrist flexion/extension, supination/pronation, digit composite flexion, x10 -A/ROM: Wrist flexion/extension, supination/pronation, digit composite flexion, x10 -Weight bearing in quadruped:  -weight shifting 2x60"  -Weight on RUE, Reaching forward with LUE to place large pegs on a peg board in front of him -E-Stim: [place] setting, 34 amps, 10/10 pattern, 10 mins, wrist extension. Last 4 mins working on digit composite flexion while in wrist extension and digit extension while in wrist flexion.  05/08/22 -Shoulder A/ROM: seated flexion, abduction, protraction x10 -Elbow and wrist A/ROM: elbow flexion/extension, wrist flexion/extension, supination pronation, x10 -Digit ROM: digit composite flexion, opposition  -attempted abduction/adduction and extension however required max assist to complete   -Weight bearing in quadruped:  -weight shifting 2x60"  -reaching and tapping, LUE x10, RUE x10 -attempted checker flipping - attempted for ~3 mins, unable to fully flip without max assist to maintain pinch on checker -Towel scrunches: x10   PATIENT EDUCATION: Education details: Towel Scrunches Person educated: Patient and Spouse Education method: Explanation, Demonstration, and Handouts Education comprehension: verbalized understanding and returned demonstration  HOME EXERCISE PROGRAM: 12/27: AA/ROM 12/29: A/ROM of shoulder, wrist, and fingers 1/3: Digit ROM 1/14: Weight bearing 1/18: Towel Scrunches   GOALS: Goals reviewed with patient? Yes  SHORT TERM GOALS: Target date: 05/15/22  Pt will be educated on HEP to improve ability to actively use RUE during functional task completion.  Goal status: IN PROGRESS  2.  Pt will increase A/ROM in RUE to [facility] in order to improve ability to perform dressing tasks with minimal compensatory strategies.  Goal status: IN PROGRESS  3.  Pt will increase strength in the RUE to 4-/5 in order to improve ability to actively assist the LUE in lifting tasks required during grooming and bathing.  Goal status: IN PROGRESS  4.  Pt will decrease pain in the RUE to 2/10 or less in order to sleep for 3+ consecutive hours without waking due to pain.  Goal status: IN PROGRESS  5.  Pt will be educated in desensitization techniques to improve tolerance to clothing on the RUE.  Goal status: IN PROGRESS  6.  Pt will increase ROM in right digits to improve ability to form a full grasp required for holding items. Goal status: IN PROGRESS  LONG TERM GOALS: Target date: 06/05/22  Pt will be educated on and demonstrate independence in use of AE during meal preparation tasks, as well as self feeding as needed. Goal status: IN PROGRESS  2.  Pt will improve strength in RUE to 4+/5 or better in order to independently complete lifting tasks required by cooking and  cleaning activities.  Goal status: IN PROGRESS  3.  Pt will increase fine motor coordination in RUE by completing 9 hole peg test in under 1 min to improve ability to perform dressing tasks including operating buttons and zippers.  Goal status: IN PROGRESS  4.  Pt will increase right grip strength by 10# and pinch strength by 3# to improve ability to grasp and hold pots and pans during meal preparation. Goal status: IN PROGRESS   ASSESSMENT:  CLINICAL IMPRESSION: Pt presented to therapy with increased stiffness/tone in his  R shoulder and elbow this session. As a warm up, OT had pt working on active movements at the elbow, wrist, and hand, then started e-stim work for wrist extension, while adding in active wrist flexion and digit composite flexion. Towards the last 2 minutes of the e-stim, he began having muscle fatigue in his wrist with extension. OT providing education on importance of following HEP to keep working on improving active muscle activation and movements at home. Pt requiring up to mod assist at elbow for support intermittently this session, as well as verbal and tactile cuing for positioning and technique.    PLAN:  OT FREQUENCY: 2x/week  OT DURATION: 6 weeks  PLANNED INTERVENTIONS: self care/ADL training, therapeutic exercise, therapeutic activity, neuromuscular re-education, manual therapy, passive range of motion, functional mobility training, electrical stimulation, ultrasound, moist heat, cryotherapy, patient/family education, cognitive remediation/compensation, and DME and/or AE instructions  RECOMMENDED OTHER SERVICES: PT and Speech  CONSULTED AND AGREED WITH PLAN OF CARE: Patient and family member/caregiver  PLAN FOR NEXT SESSION: P/ROM, AA/ROM, A/ROM, E-stim, closed chain exercises, weight bearing, gripping tasks, supine AA/ROM    Trish Mage, OTR/L [phone] 05/14/2022, 10:41 AM

## 2022-05-19 ENCOUNTER — Encounter (HOSPITAL_COMMUNITY): Payer: Medicare HMO | Admitting: Occupational Therapy

## 2022-05-20 ENCOUNTER — Encounter: Payer: Self-pay | Admitting: Neurology

## 2022-05-20 ENCOUNTER — Ambulatory Visit (INDEPENDENT_AMBULATORY_CARE_PROVIDER_SITE_OTHER): Payer: Medicare HMO | Admitting: Neurology

## 2022-05-20 VITALS — BP 109/66 | HR 67 | Ht 74.0 in | Wt 184.6 lb

## 2022-05-20 DIAGNOSIS — R252 Cramp and spasm: Secondary | ICD-10-CM

## 2022-05-20 DIAGNOSIS — Z8673 Personal history of transient ischemic attack (TIA), and cerebral infarction without residual deficits: Secondary | ICD-10-CM

## 2022-05-20 DIAGNOSIS — I69398 Other sequelae of cerebral infarction: Secondary | ICD-10-CM

## 2022-05-20 NOTE — Progress Notes (Signed)
Patient: Terry Mayo Date of Birth: December 07, 1955  Reason for Visit: Follow up left MCA stroke History from: Patient, wife Primary Neurologist: Saw Dr. Erlinda Mayo and Dr. Leonie Mayo     HISTORY OF PRESENT ILLNESS: Update 05/20/22 : He returns for follow-up after last visit 4 months ago.  He is accompanied by his wife.  He has noted improvement in his speech as well as swallowing but right upper extremity weakness and pain and spasticity persist.  He still has practically not much useful function in the right hand and can basilarly fingers of the hold anything in that hand and is  still currently undergoing occupational and physical therapy 1 to twice a week.He is tolerating aspirin and Plavix well without bruising or bleeding.  Blood pressure is under good control today it is 109/66.  He had lipid profile checked on 04/13/2022 which is not satisfactory with LDL greater than 70 Zetia was added Repatha injections.  His first injection was earlier this month.  He has not had any recurrent stroke or TIA symptoms.  Last visit 01/15/2022 Terry Denmark, NP )Terry Mayo is here today for stroke clinic follow-up.  He had an NSTEMI with cath 12/04/2021 no stents were placed, had been doing well. 12/09/21 developed acute onset right-sided weakness and dysarthria went to the ED.  He was given TNK, post IR with TICI3 reperfusion with Dr. Katherina Right Mayo for left terminus occlusion.  Loop recorder was placed 12/16/21.  He was discharged to the [facility]  12/24/21 went to the ER for trouble swallowing diagnosed with esophagitis.  Worked with therapy PT/OT/ST on a pured diet with nectar thick liquids.  He was able to ambulate independently.  He was discharged home 01/01/22.   Is now home with his wife, is doing home health PT/ST/OT. Is doing better with swallowing, upgraded to more solids, food comes out of the right mouth, drools. Doesn't have much appetite. Was picky eater before. Walking with a cane now. Limited movement of  right arm, not much use of hand. Speech is still slurred, is getting better. Was not established with cardiology, sees Dr. Harl Bowie next month. Is retired. Stopped smoking THC in August.   -CT head was negative -CTA head and neck distal left ICA and left MCA occlusion -Status post IR with TICI3 reperfusion -MRI of the brain showed large left MCA infarct -2D echo 45 to 50% -EP placed a loop recorder prior to discharge -LDL 41 -A1c 5.5 -Aspirin 81 mg daily and Plavix 75 mg daily prior to admission, continue DAPT at discharge, duration per cardiology  HISTORY  Copied 12/10/21 Dr. Curly Mayo HPI: Terry Mayo is a 67 y.o. male with a past medical history significant for hypertension, hyperlipidemia, former smoking, BMI 25.67, recent NSTEMI s/p cath 12/04/2021 (medical management).   He was doing well after discharge until he developed acute onset right-sided weakness and dysarthria for which he presented to the ED for evaluation.   He was initially evaluated by telespecialists who administered TNK and then discussed potential thrombectomy with myself.  I obtained consent from the patient's wife via telephone for the procedure reviewing risks and benefits and answering her questions.   She confirms that he had been doing well postdischarge, but had been having headaches for the past couple of weeks (his typical migraines, but these had been resolved for the last few years).  She had stepped away to use the bathroom, and found him 5 to 10 minutes later with severely slurred  speech and right-sided weakness as well as some confusion.  She notes his weight is very variable but denies any other health concerns   LKW: 2150 Per telemetry specialists evaluation TNK given?:  2309 IA performed?:  Yes Premorbid modified rankin scale:      0 - No symptoms  REVIEW OF SYSTEMS: Out of a complete 14 system review of symptoms, the patient complains only of the following symptoms, and all other reviewed systems are  negative.  See HPI  ALLERGIES: Allergies  Allergen Reactions   Penicillins     REACTION UNKNOWN Has patient had a PCN reaction causing immediate rash, facial/tongue/throat swelling, SOB or lightheadedness with hypotension: UNKNOWN Has patient had a PCN reaction causing severe rash involving mucus membranes or skin necrosis: UNKNOWN Has patient had a PCN reaction that required hospitalization UNKNOWN Has patient had a PCN reaction occurring within the last 10 years: UNKNOWN If all of the above answers are "NO", then may proceed with Cephalosporin use.     HOME MEDICATIONS: Outpatient Medications Prior to Visit  Medication Sig Dispense Refill   alum & mag hydroxide-simeth (MAALOX PLUS) 400-400-40 MG/5ML suspension Take 15 mLs by mouth every 6 (six) hours as needed for indigestion (pain with swallowing). 355 mL 0   aspirin EC 81 MG tablet Take 1 tablet (81 mg total) by mouth daily. Swallow whole. 30 tablet 3   busPIRone (BUSPAR) 7.5 MG tablet Take 1 tablet (7.5 mg total) by mouth 2 (two) times daily. 60 tablet 0   clopidogrel (PLAVIX) 75 MG tablet Take 1 tablet (75 mg total) by mouth daily with breakfast. 30 tablet 0   Evolocumab (REPATHA SURECLICK) 140 MG/ML SOAJ Inject 140 mg into the skin every 14 days. 6 mL 3   ezetimibe (ZETIA) 10 MG tablet Take 1 tablet (10 mg total) by mouth daily. 90 tablet 3   hydrOXYzine (VISTARIL) 25 MG capsule Take 25 mg by mouth 2 (two) times daily as needed for anxiety.     isosorbide dinitrate (ISORDIL) 20 MG tablet Take 1 tablet (20 mg total) by mouth 2 (two) times daily. 60 tablet 0   losartan (COZAAR) 25 MG tablet Take 1 tablet (25 mg total) by mouth daily. 30 tablet 0   metoprolol tartrate (LOPRESSOR) 25 MG tablet Take 0.5 tablets (12.5 mg total) by mouth 2 (two) times daily. 60 tablet 0   mirtazapine (REMERON) 15 MG tablet Take 15 mg by mouth at bedtime.     nitroGLYCERIN (NITROSTAT) 0.4 MG SL tablet Place 1 tablet (0.4 mg total) under the tongue every  5 (five) minutes x 3 doses as needed for chest pain. 20 tablet 0   omeprazole (PRILOSEC OTC) 20 MG tablet Take 20 mg by mouth as needed (acid reflux).     pantoprazole sodium (PROTONIX) 40 mg Take 40 mg by mouth daily. (Patient taking differently: Take 40 mg by mouth as needed.) 30 each 0   polyethylene glycol powder (GLYCOLAX/MIRALAX) 17 GM/SCOOP powder 1 scoop mixed with 8 ounces of fluid Orally Once a day for 30 days     predniSONE (DELTASONE) 20 MG tablet Take by mouth.     spironolactone (ALDACTONE) 25 MG tablet Take 0.5 tablets (12.5 mg total) by mouth daily. 15 tablet 0   traZODone (DESYREL) 50 MG tablet 1 tablet at bedtime as needed Orally Once a day for 30 day(s)     No facility-administered medications prior to visit.    PAST MEDICAL HISTORY: Past Medical History:  Diagnosis Date  Arthritis    Essential hypertension    Hyperlipidemia    Kidney stones    MI (myocardial infarction) (HCC)    Stroke [facility])     PAST SURGICAL HISTORY: Past Surgical History:  Procedure Laterality Date   CHOLECYSTECTOMY  1990s   COLONOSCOPY WITH PROPOFOL N/A 09/30/2015   Procedure: COLONOSCOPY WITH PROPOFOL;  Surgeon: Corbin Ade, MD;  Location: [facility] END[facility];  Service: Endoscopy;  Laterality: N/A;  930   IR CT HEAD LTD  12/10/2021   IR PERCUTANEOUS ART THROMBECTOMY/INFUSION INTRACRANIAL INC DIAG ANGIO  12/10/2021   IR US GUIDE VASC ACCESS RIGHT  12/10/2021   KIDNEY STONE SURGERY  2005   LEFT HEART CATH AND CORONARY ANGIOGRAPHY N/A 12/04/2021   Procedure: LEFT HEART CATH AND CORONARY ANGIOGRAPHY;  Surgeon: Lyn Records, MD;  Location: [facility] INVASIVE CV LAB;  Service: Cardiovascular;  Laterality: N/A;   LOOP RECORDER INSERTION N/A 12/16/2021   Procedure: LOOP RECORDER INSERTION;  Surgeon: Regan Lemming, MD;  Location: [facility] INVASIVE CV LAB;  Service: Cardiovascular;  Laterality: N/A;   RADIOLOGY WITH ANESTHESIA N/A 12/10/2021   Procedure: IR WITH ANESTHESIA;  Surgeon: Julieanne Cotton, MD;   Location: [facility] OR;  Service: Radiology;  Laterality: N/A;    FAMILY HISTORY: Family History  Problem Relation Age of Onset   Coronary artery disease Sister    Colon cancer Neg Hx     SOCIAL HISTORY: Social History   Socioeconomic History   Marital status: Single    Spouse name: Not on file   Number of children: Not on file   Years of education: Not on file   Highest education level: Not on file  Occupational History   Not on file  Tobacco Use   Smoking status: Former    Types: Cigarettes    Quit date: 08/05/2015    Years since quitting: 6.7    Passive exposure: Past   Smokeless tobacco: Never  Vaping Use   Vaping Use: Never used  Substance and Sexual Activity   Alcohol use: Not Currently    Alcohol/week: 0.0 standard drinks of alcohol    Comment: No ETOH in a month. Variable, up to daily, as much as much as a fifth of liquor per sitting.   Drug use: No    Frequency: 7.0 times per week    Comment: As often as daily when he does smoke marijuana   Sexual activity: Yes    Birth control/protection: None  Other Topics Concern   Not on file  Social History Narrative   Not on file   Social Determinants of Health   Financial Resource Strain: Not on file  Food Insecurity: Not on file  Transportation Needs: Not on file  Physical Activity: Not on file  Stress: Not on file  Social Connections: Not on file  Intimate Partner Violence: Not on file   PHYSICAL EXAM  Vitals:   05/20/22 1429  BP: 109/66  Pulse: 67  Weight: 83.7 kg  Height: 6\' 2"  (1.88 m)   Body mass index is 23.7 kg/m.  Generalized: Well developed, in no acute distress  Neurological examination  Mentation: Alert oriented to time, place, history taking. Follows all commands, speech is moderately dysarthric, very pleasant Cranial nerve II-XII: Pupils were equal round reactive to light.  Left gaze preference, does not completely track to the right.  Right-sided facial droop.  Decreased right-sided shoulder  shrug. Motor: right arm 2/5, trace movement of the right wrist or hand cannot squeeze, left arm  is normal 4/5 right leg, left leg is normal.  Increased tone in the right upper greater than lower extremity with pain on extension of the right elbow. Sensory: Sensory testing is intact to soft touch on all 4 extremities. No evidence of extinction is noted.  Coordination: Cannot perform finger-nose-finger with the right, heel to shin both sides is normal Gait and station: Cautious, wide-based, tends to drag the right leg., uses cane Reflexes: Deep tendon reflexes are symmetric but increased on the right slightly  DIAGNOSTIC DATA (LABS, IMAGING, TESTING) - I reviewed patient records, labs, notes, testing and imaging myself where available.  Lab Results  Component Value Date   WBC 8.0 04/04/2022   HGB 13.9 04/04/2022   HCT 40.7 04/04/2022   MCV 92.7 04/04/2022   PLT 207 04/04/2022      Component Value Date/Time   NA 139 04/04/2022 1328   K 3.9 04/04/2022 1328   CL 105 04/04/2022 1328   CO2 27 04/04/2022 1328   GLUCOSE 92 04/04/2022 1328   BUN 17 04/04/2022 1328   CREATININE 1.37 (H) 04/04/2022 1328   CALCIUM 9.1 04/04/2022 1328   PROT 7.4 02/10/2022 1039   ALBUMIN 4.1 02/10/2022 1039   AST 109 (H) 02/10/2022 1039   ALT 193 (H) 02/10/2022 1039   ALKPHOS 86 02/10/2022 1039   BILITOT 0.8 02/10/2022 1039   GFRNONAA 57 (L) 04/04/2022 1328   GFRAA >60 09/26/2015 0800   Lab Results  Component Value Date   CHOL 99 12/05/2021   HDL 50 12/05/2021   LDLCALC 41 12/05/2021   TRIG 58 12/11/2021   CHOLHDL 2.0 12/05/2021   Lab Results  Component Value Date   HGBA1C 5.5 12/05/2021   No results found for: "VITAMINB12" No results found for: "TSH"  IMPRESSION : 67 year old African-American male with left MCA infarct in August 2023 due to terminal left ICA and MCA occlusion treated with thrombolysis with IV TNK followed by successful mechanical thrombectomy.  He is doing well but still has  mild right spastic hemiparesis with significant right. upper extremity weakness.  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  I encouraged him to continue ongoing physical therapy recommend referral to Dr. Krista Blue for consideration for Botox injections to improve right upper extremity injury of motion.  Patient may also benefit with consideration for VIvistim program later after completing his therapy and Botox sessions.  Continue aspirin and Plavix for stroke prevention given history of cardiac stents and maintain aggressive risk factor modification with strict control of hypertension with blood pressure goal below 130/90, lipids with LDL cholesterol goal below 70 mg for follow-up in the future with me months or call earlier if needed.  Greater than 50% time during this 35-minute visit was spent in counseling and coordination of care about his stroke and spastic right hemiparesis questionable evaluation and treatment and answering questions.  Antony Contras, MD 05/20/2022, 3:26 PM [facility] [address], [place], [place] [id number] [phone]

## 2022-05-20 NOTE — Patient Instructions (Signed)
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  I encouraged him to continue ongoing physical therapy recommend referral to Dr. Krista Blue for consideration for Botox injections to improve right upper extremity injury of motion.  Patient may also benefit with consideration for VIvistim program later after completing his therapy and Botox sessions.  Continue aspirin and Plavix for stroke prevention given history of cardiac stents and maintain aggressive risk factor modification with strict control of hypertension with blood pressure goal below 130/90, lipids with LDL cholesterol goal below 70 mg for follow-up in the future with me months or call earlier if needed.  IncobotulinumtoxinA Injection What is this medication? INCOBOTULINUMTOXINA (IN koh BOT ue LYE num TOX in AY) treats severe muscle spasms. It works by relaxing your muscles, which reduces muscle stiffness. It may also be used to treat excessive drooling caused by some conditions, such as cerebral palsy. It can be used to decrease the appearance of facial wrinkles. This medicine may be used for other purposes; ask your health care provider or pharmacist if you have questions. COMMON BRAND NAME(S): Xeomin What should I tell my care team before I take this medication? They need to know if you have any of these conditions: Conditions that affect your nerves or muscles, such as myasthenia gravis Eyelid drooping History of eye surgery or surgery on your face Lung disease Skin infection at the planned injection site Trouble speaking Trouble swallowing An unusual or allergic reaction to botulinum toxin, albumin, sucrose, other medications, foods, dyes, or preservatives Pregnant or trying to get pregnant Breast-feeding How should I use this medication? This medication is injected into a muscle. It is given by your care team in a hospital or clinic setting. A  special MedGuide will be given to you before each treatment. Be sure to read this information carefully each time. Talk to your care team about the use of this medication in children. While it may be prescribed for children as young as 2 years for selected conditions, precautions do apply. Overdosage: If you think you have taken too much of this medicine contact a poison control center or emergency room at once. NOTE: This medicine is only for you. Do not share this medicine with others. What if I miss a dose? This does not apply. What may interact with this medication? Antihistamines for allergy, cough, and cold Atropine Certain antibiotics, such as gentamicin, neomycin, or tobramycin Certain medications for bladder problems, such as oxybutynin or tolterodine Certain medications for Parkinson disease, such as benztropine or trihexyphenidyl Certain medications for stomach problems, such as dicyclomine or hyoscyamine Certain medications for travel sickness, such as scopolamine Certain medications that prevent or treat blood clots, such as warfarin, enoxaparin, or dalteparin Ipratropium Medications that help you fall asleep Medications that relax muscles Other botulinum toxin injections This list may not describe all possible interactions. Give your health care provider a list of all the medicines, herbs, non-prescription drugs, or dietary supplements you use. Also tell them if you smoke, drink alcohol, or use illegal drugs. Some items may interact with your medicine. What should I watch for while using this medication? Your condition will be monitored carefully while you are receiving this medication. This medication will cause weakness in the muscle where it is injected. Tell your care team if you feel unusually weak in other muscles. Get medical help right away if you have problems with breathing, swallowing, or talking. This  medication contains albumin from human blood. Talk to your care  team about the risks and benefits of this medication. If your activities have been limited by your condition, go back to your regular routine slowly after treatment with this medication. You may get muscle weakness, blurred vision, or drooping eyelids. If this happens, do not drive, use machinery, or do other dangerous activities. What side effects may I notice from receiving this medication? Side effects that you should report to your care team as soon as possible: Allergic reactions--skin rash, itching, hives, swelling of the face, lips, tongue, or throat Dryness or irritation of the eyes, eye pain, change in vision, sensitivity to light Spread of botulinum toxin effects--unusual weakness or fatigue, blurry or double vision, trouble swallowing, hoarseness or trouble speaking, trouble breathing, loss of bladder control Side effects that usually do not require medical attention (report to your care team if they continue or are bothersome): Dry mouth Eyelid drooping Headache Muscle pain Pain, redness, or irritation at injection site Runny or stuffy nose This list may not describe all possible side effects. Call your doctor for medical advice about side effects. You may report side effects to FDA at 1-800-FDA-1088. Where should I keep my medication? This medication is given in a hospital or clinic. It will not be stored at home. NOTE: This sheet is a summary. It may not cover all possible information. If you have questions about this medicine, talk to your doctor, pharmacist, or health care provider.  2023 Elsevier/Gold Standard (2021-07-16 00:00:00)

## 2022-05-21 ENCOUNTER — Telehealth: Payer: Self-pay

## 2022-05-21 ENCOUNTER — Other Ambulatory Visit (HOSPITAL_COMMUNITY): Payer: Self-pay

## 2022-05-21 NOTE — Telephone Encounter (Signed)
Dr. Leonie Man has requested Dr. Krista Blue evaluate and treat with Xeomin. We need to schedule a visit with Dr. Krista Blue first and then we will obtain PA for Xeomin.   Can we call and schedule f/u with Dr. Krista Blue?  Thanks!

## 2022-05-21 NOTE — Telephone Encounter (Signed)
Scheduled with Dr. Krista Blue 06/15/2022 at 1:00 pm.

## 2022-05-22 ENCOUNTER — Ambulatory Visit (HOSPITAL_COMMUNITY): Payer: Medicare HMO | Admitting: Occupational Therapy

## 2022-05-22 ENCOUNTER — Encounter (HOSPITAL_COMMUNITY): Payer: Self-pay | Admitting: Occupational Therapy

## 2022-05-22 DIAGNOSIS — R278 Other lack of coordination: Secondary | ICD-10-CM

## 2022-05-22 DIAGNOSIS — I69351 Hemiplegia and hemiparesis following cerebral infarction affecting right dominant side: Secondary | ICD-10-CM

## 2022-05-22 DIAGNOSIS — R29818 Other symptoms and signs involving the nervous system: Secondary | ICD-10-CM

## 2022-05-22 NOTE — Therapy (Signed)
OUTPATIENT OCCUPATIONAL THERAPY NEURO TREATMENT NOTE  Patient Name: Terry Mayo MRN: [id number] DOB:1955/06/21, 67 y.o., male Today's Date: 05/01/2022  PCP: Annye Asa, MD REFERRING PROVIDER: Annye Asa, MD  END OF SESSION:  END OF SESSION:  OT End of Session - 05/22/22 1349     Visit Number 10    Number of Visits 13    Date for OT Re-Evaluation 06/05/22    Authorization Type [employer], $20 copay    Authorization - Visit Number 9    Authorization - Number of Visits 12    Progress Note Due on Visit 10    OT Start Time 1345    OT Stop Time 1430    OT Time Calculation (min) 45 min    Activity Tolerance Patient tolerated treatment well    Behavior During Therapy WFL for tasks assessed/performed              Past Medical History:  Diagnosis Date   Arthritis    Essential hypertension    Hyperlipidemia    Kidney stones    MI (myocardial infarction) (HCC)    Stroke [facility])    Past Surgical History:  Procedure Laterality Date   CHOLECYSTECTOMY  1990s   COLONOSCOPY WITH PROPOFOL N/A 09/30/2015   Procedure: COLONOSCOPY WITH PROPOFOL;  Surgeon: Corbin Ade, MD;  [facility];  Service: Endoscopy;  Laterality: N/A;  930   IR CT HEAD LTD  12/10/2021   IR PERCUTANEOUS ART THROMBECTOMY/INFUSION INTRACRANIAL INC DIAG ANGIO  12/10/2021   IR US GUIDE VASC ACCESS RIGHT  12/10/2021   KIDNEY STONE SURGERY  2005   LEFT HEART CATH AND CORONARY ANGIOGRAPHY N/A 12/04/2021   Procedure: LEFT HEART CATH AND CORONARY ANGIOGRAPHY;  Surgeon: Lyn Records, MD;  [facility]: [facility];  Service: Cardiovascular;  Laterality: N/A;   LOOP RECORDER INSERTION N/A 12/16/2021   Procedure: LOOP RECORDER INSERTION;  Surgeon: Regan Lemming, MD;  Location: [facility];  Service: Cardiovascular;  Laterality: N/A;   RADIOLOGY WITH ANESTHESIA N/A 12/10/2021   Procedure: IR WITH ANESTHESIA;  Surgeon: Julieanne Cotton, MD;  Location: [facility] OR;  Service: Radiology;   Laterality: N/A;   Patient Active Problem List   Diagnosis Date Noted   Constipation 05/05/2022   Esophagitis 12/26/2021   Right hemiplegia (HCC) 12/17/2021   History of ischemic left ICA stroke 12/10/2021   Acute left ICA ischemic stroke (HCC) 12/10/2021   CAD (coronary artery disease) 12/05/2021   Ischemic cardiomyopathy 12/05/2021   NSVT (nonsustained ventricular tachycardia) (HCC) 12/05/2021   Hypertension 12/05/2021   Hyperlipidemia 12/05/2021   NSTEMI (non-ST elevated myocardial infarction) (HCC) 12/04/2021   Long term (current) use of antithrombotics/antiplatelets    Loss of weight 09/04/2015   Encounter for screening colonoscopy 09/04/2015    ONSET DATE: 12/10/21  REFERRING DIAG: L CVA  THERAPY DIAG:  Other lack of coordination  Other symptoms and signs involving the nervous system  Hemiplegia and hemiparesis following cerebral infarction affecting right dominant side (HCC)  Rationale for Evaluation and Treatment: Rehabilitation  SUBJECTIVE:   SUBJECTIVE STATEMENT: "I can't lift my arm"  PERTINENT HISTORY: Pt had a heart attack and was hospitalized. Once he discharged home a few days, he presented with slurred speech, balance deficits, and R sided weakness. Pt diagnosed with L ischemic Stroke. He went to [facility] for Rehab for several weeks.  PRECAUTIONS: Fall  WEIGHT BEARING RESTRICTIONS: No  PAIN:  Are you having pain? Yes: NPRS scale: 5/10 Pain  location: right wrist Pain description: aching Aggravating factors: moving it a lot Relieving factors: resting it  FALLS: Has patient fallen in last 6 months? Yes. Number of falls 1 - when he had the stroke  LIVING ENVIRONMENT: Lives with: lives with their spouse Lives in: House/apartment Stairs: Yes: External: 1 steps; none Has following equipment at home: shower chair, bed side commode, and Grab bars  PLOF: Independent  PATIENT GOALS: To get the hand working "right"  OBJECTIVE:   HAND DOMINANCE:  Left  ADLs: Overall ADLs: Pt reports difficulty with dressing, unable to do any fasteners/zippers, Pt unable to shave or fix his hair, pt unable to do any IADL's at this time. Per wife's report she helps pt around 90% with all of his daily care.   MOBILITY STATUS: Independent  POSTURE COMMENTS:  No Significant postural limitations Sitting balance: Moves/returns truncal midpoint >2 inches in all planes  ACTIVITY TOLERANCE: Activity tolerance: Pt having some increased fatigue.  FUNCTIONAL OUTCOME MEASURES: Quick Dash: 85.0%  UPPER EXTREMITY ROM:    Passive ROM Right eval  Shoulder flexion 100  Shoulder abduction 89  Shoulder internal rotation 90  Shoulder external rotation 41  Elbow flexion 139  Elbow extension 8  Wrist flexion 60  Wrist extension 32  Wrist ulnar deviation 13  Wrist radial deviation 28  Wrist pronation 63  Wrist supination WFL  (Blank rows = not tested)   Active ROM Right eval Right 05/22/22  Shoulder flexion 40 48  Shoulder abduction 56 53  Shoulder internal rotation 90 90  Shoulder external rotation 30 25  Elbow flexion 122 125  Elbow extension 6 5  Wrist flexion 54 65  Wrist extension 10 10  Wrist ulnar deviation 10 21  Wrist radial deviation 17 16  Wrist pronation WFL WFL  Wrist supination 70 70  (Blank rows = not tested) UPPER EXTREMITY MMT:     MMT Right eval  Shoulder flexion   Shoulder abduction   Shoulder adduction   Shoulder extension   Shoulder internal rotation   Shoulder external rotation   Middle trapezius   Lower trapezius   Elbow flexion   Elbow extension   Wrist flexion   Wrist extension   Wrist ulnar deviation   Wrist radial deviation   Wrist pronation   Wrist supination   (Blank rows = not tested)  HAND FUNCTION: Grip strength: Right: -- lbs; Left: -- lbs, Lateral pinch: Right: -- lbs, Left: -- lbs, and 3 point pinch: Right: -- lbs, Left: -- lbs  COORDINATION: Box and Blocks:  Right --blocks, Left  --blocks  SENSATION: Light touch: Impaired   EDEMA: Moderate swelling noted in the Right hand and wrist  MUSCLE TONE: RUE: Mild  COGNITION: Overall cognitive status: Impaired  VISION: Subjective report: With onset of CVA pt reports blurriness Baseline vision:  Supposed to wear glasses, does not have them anymore Visual history:  No pertinent history  VISION ASSESSMENT: Reading acuity: Some blurriness in the beginning, now he reports at his baseline.  PERCEPTION: WFL  PRAXIS: Impaired: Motor planning  OBSERVATIONS: Limited coordination and ROM in the RUE   TODAY'S TREATMENT:  DATE:  05/22/22   05/14/22 -A/ROM: Wrist flexion/extension, supination/pronation, digit composite flexion, x10 -E-Stim: [place] setting, 44 amps, 10/10 pattern, 10 mins, wrist extension. Last 4 mins working on digit composite flexion while in wrist flexion -Towel scrunches: x10 -Holding small foam roll between BUE, pt completing protraction, flexion, x15, able to readjust his hands on the foam roll independently with increased time.   05/12/22 -P/ROM: Wrist flexion/extension, supination/pronation, digit composite flexion, x10 -A/ROM: Wrist flexion/extension, supination/pronation, digit composite flexion, x10 -Weight bearing in quadruped:  -weight shifting 2x60"  -Weight on RUE, Reaching forward with LUE to place large pegs on a peg board in front of him -E-Stim: [place] setting, 34 amps, 10/10 pattern, 10 mins, wrist extension. Last 4 mins working on digit composite flexion while in wrist extension and digit extension while in wrist flexion.  05/08/22 -Shoulder A/ROM: seated flexion, abduction, protraction x10 -Elbow and wrist A/ROM: elbow flexion/extension, wrist flexion/extension, supination pronation, x10 -Digit ROM: digit composite flexion, opposition  -attempted  abduction/adduction and extension however required max assist to complete  -Weight bearing in quadruped:  -weight shifting 2x60"  -reaching and tapping, LUE x10, RUE x10 -attempted checker flipping - attempted for ~3 mins, unable to fully flip without max assist to maintain pinch on checker -Towel scrunches: x10   PATIENT EDUCATION: Education details: Towel Scrunches Person educated: Patient and Spouse Education method: Explanation, Demonstration, and Handouts Education comprehension: verbalized understanding and returned demonstration  HOME EXERCISE PROGRAM: 12/27: AA/ROM 12/29: A/ROM of shoulder, wrist, and fingers 1/3: Digit ROM 1/14: Weight bearing 1/18: Towel Scrunches   GOALS: Goals reviewed with patient? Yes  SHORT TERM GOALS: Target date: 05/15/22  Pt will be educated on HEP to improve ability to actively use RUE during functional task completion.  Goal status: IN PROGRESS  2.  Pt will increase A/ROM in RUE to [facility] in order to improve ability to perform dressing tasks with minimal compensatory strategies.  Goal status: IN PROGRESS  3.  Pt will increase strength in the RUE to 4-/5 in order to improve ability to actively assist the LUE in lifting tasks required during grooming and bathing.  Goal status: IN PROGRESS  4.  Pt will decrease pain in the RUE to 2/10 or less in order to sleep for 3+ consecutive hours without waking due to pain.  Goal status: IN PROGRESS  5.  Pt will be educated in desensitization techniques to improve tolerance to clothing on the RUE.  Goal status: IN PROGRESS  6.  Pt will increase ROM in right digits to improve ability to form a full grasp required for holding items. Goal status: IN PROGRESS  LONG TERM GOALS: Target date: 06/05/22  Pt will be educated on and demonstrate independence in use of AE during meal preparation tasks, as well as self feeding as needed. Goal status: IN PROGRESS  2.  Pt will improve strength in RUE to 4+/5 or  better in order to independently complete lifting tasks required by cooking and cleaning activities.  Goal status: IN PROGRESS  3.  Pt will increase fine motor coordination in RUE by completing 9 hole peg test in under 1 min to improve ability to perform dressing tasks including operating buttons and zippers.  Goal status: IN PROGRESS  4.  Pt will increase right grip strength by 10# and pinch strength by 3# to improve ability to grasp and hold pots and pans during meal preparation. Goal status: IN PROGRESS   ASSESSMENT:  CLINICAL IMPRESSION: Pt presented to therapy with increased  stiffness/tone in his R shoulder and elbow this session. As a warm up, OT had pt working on active movements at the elbow, wrist, and hand, then started e-stim work for wrist extension, while adding in active wrist flexion and digit composite flexion. Towards the last 2 minutes of the e-stim, he began having muscle fatigue in his wrist with extension. OT providing education on importance of following HEP to keep working on improving active muscle activation and movements at home. Pt requiring up to mod assist at elbow for support intermittently this session, as well as verbal and tactile cuing for positioning and technique.    PLAN:  OT FREQUENCY: 2x/week  OT DURATION: 6 weeks  PLANNED INTERVENTIONS: self care/ADL training, therapeutic exercise, therapeutic activity, neuromuscular re-education, manual therapy, passive range of motion, functional mobility training, electrical stimulation, ultrasound, moist heat, cryotherapy, patient/family education, cognitive remediation/compensation, and DME and/or AE instructions  RECOMMENDED OTHER SERVICES: PT and Speech  CONSULTED AND AGREED WITH PLAN OF CARE: Patient and family member/caregiver  PLAN FOR NEXT SESSION: P/ROM, AA/ROM, A/ROM, E-stim, closed chain exercises, weight bearing, gripping tasks, supine AA/ROM    Paulita Fujita, OTR/L [phone] 05/22/2022, 1:51  PM

## 2022-05-26 NOTE — Telephone Encounter (Signed)
PA needed for Xeomin 600 units [place], [id number], [id number], [id number].  DX Code I69.351.

## 2022-05-29 ENCOUNTER — Ambulatory Visit (HOSPITAL_COMMUNITY): Payer: Medicare HMO | Attending: Emergency Medicine | Admitting: Occupational Therapy

## 2022-05-29 ENCOUNTER — Encounter (HOSPITAL_COMMUNITY): Payer: Self-pay | Admitting: Occupational Therapy

## 2022-05-29 DIAGNOSIS — R29818 Other symptoms and signs involving the nervous system: Secondary | ICD-10-CM

## 2022-05-29 DIAGNOSIS — R278 Other lack of coordination: Secondary | ICD-10-CM | POA: Diagnosis present

## 2022-05-29 DIAGNOSIS — I69351 Hemiplegia and hemiparesis following cerebral infarction affecting right dominant side: Secondary | ICD-10-CM

## 2022-05-29 NOTE — Therapy (Signed)
OUTPATIENT OCCUPATIONAL THERAPY NEURO TREATMENT NOTE AND PROGRESS NOTE  Patient Name: Terry Mayo MRN: [id number] DOB:07/26/1955, 67 y.o., male Today's Date: 05/01/2022  PCP: Zebedee Iba, MD REFERRING PROVIDER: Zebedee Iba, MD  Progress Note Reporting Period 04/17/22 to 05/22/22  See note below for Objective Data and Assessment of Progress/Goals.    END OF SESSION:  END OF SESSION:  OT End of Session - 05/29/22 1256     Visit Number 11    Number of Visits 13    Date for OT Re-Evaluation 06/05/22    Authorization Type [employer], $20 copay    Authorization - Visit Number 10    Authorization - Number of Visits 12    Progress Note Due on Visit 10    OT Start Time 1300    OT Stop Time 1340    OT Time Calculation (min) 40 min    Activity Tolerance Patient tolerated treatment well    Behavior During Therapy WFL for tasks assessed/performed              Past Medical History:  Diagnosis Date   Arthritis    Essential hypertension    Hyperlipidemia    Kidney stones    MI (myocardial infarction) ([place])    Stroke [facility])    Past Surgical History:  Procedure Laterality Date   CHOLECYSTECTOMY  1990s   COLONOSCOPY WITH PROPOFOL N/A 09/30/2015   Procedure: COLONOSCOPY WITH PROPOFOL;  Surgeon: Daneil Dolin, MD;  [facility];  Service: Endoscopy;  Laterality: N/A;  930   IR CT HEAD LTD  12/10/2021   IR PERCUTANEOUS ART THROMBECTOMY/INFUSION INTRACRANIAL INC DIAG ANGIO  12/10/2021   IR US GUIDE VASC ACCESS RIGHT  12/10/2021   KIDNEY STONE SURGERY  2005   LEFT HEART CATH AND CORONARY ANGIOGRAPHY N/A 12/04/2021   Procedure: LEFT HEART CATH AND CORONARY ANGIOGRAPHY;  Surgeon: Belva Crome, MD;  Location: [place] CV LAB;  Service: Cardiovascular;  Laterality: N/A;   LOOP RECORDER INSERTION N/A 12/16/2021   Procedure: LOOP RECORDER INSERTION;  Surgeon: Constance Haw, MD;  Location: [place] CV LAB;  Service: Cardiovascular;  Laterality: N/A;    RADIOLOGY WITH ANESTHESIA N/A 12/10/2021   Procedure: IR WITH ANESTHESIA;  Surgeon: Luanne Bras, MD;  Location: [place];  Service: Radiology;  Laterality: N/A;   Patient Active Problem List   Diagnosis Date Noted   Constipation 05/05/2022   Esophagitis 12/26/2021   Right hemiplegia ([place]) 12/17/2021   History of ischemic left ICA stroke 12/10/2021   Acute left ICA ischemic stroke ([place]) 12/10/2021   CAD (coronary artery disease) 12/05/2021   Ischemic cardiomyopathy 12/05/2021   NSVT (nonsustained ventricular tachycardia) ([place]) 12/05/2021   Hypertension 12/05/2021   Hyperlipidemia 12/05/2021   NSTEMI (non-ST elevated myocardial infarction) ([place]) 12/04/2021   Long term (current) use of antithrombotics/antiplatelets    Loss of weight 09/04/2015   Encounter for screening colonoscopy 09/04/2015    ONSET DATE: 12/10/21  REFERRING DIAG: L CVA  THERAPY DIAG:  Other lack of coordination  Other symptoms and signs involving the nervous system  Hemiplegia and hemiparesis following cerebral infarction affecting right dominant side ([place])  Rationale for Evaluation and Treatment: Rehabilitation  SUBJECTIVE:   SUBJECTIVE STATEMENT: "I'm so swole today and IDL why."  PERTINENT HISTORY: Pt had a heart attack and was hospitalized. Once he discharged home a few days, he presented with slurred speech, balance deficits, and R sided weakness. Pt diagnosed with L ischemic Stroke. He went to [facility] for several weeks.  PRECAUTIONS: Fall  WEIGHT BEARING RESTRICTIONS: No  PAIN:  Are you having pain? No  FALLS: Has patient fallen in last 6 months? Yes. Number of falls 1 - when he had the stroke  PATIENT GOALS: To get the hand working "right"  OBJECTIVE:   HAND DOMINANCE: Left  ADLs: Overall ADLs: Pt reports difficulty with dressing, unable to do any fasteners/zippers, Pt unable to shave or fix his hair, pt unable to do any IADL's at this time. Per wife's report she helps pt  around 90% with all of his daily care.   MOBILITY STATUS: Independent  POSTURE COMMENTS:  No Significant postural limitations Sitting balance: Moves/returns truncal midpoint >2 inches in all planes  ACTIVITY TOLERANCE: Activity tolerance: Pt having some increased fatigue.  FUNCTIONAL OUTCOME MEASURES: Quick Dash: 85.0%  UPPER EXTREMITY ROM:    Passive ROM Right eval  Shoulder flexion 100  Shoulder abduction 89  Shoulder internal rotation 90  Shoulder external rotation 41  Elbow flexion 139  Elbow extension 8  Wrist flexion 60  Wrist extension 32  Wrist ulnar deviation 13  Wrist radial deviation 28  Wrist pronation 63  Wrist supination WFL  (Blank rows = not tested)   Active ROM Right eval Right 05/22/22  Shoulder flexion 40 48  Shoulder abduction 56 53  Shoulder internal rotation 90 90  Shoulder external rotation 30 25  Elbow flexion 122 125  Elbow extension 6 5  Wrist flexion 54 65  Wrist extension 10 10  Wrist ulnar deviation 10 21  Wrist radial deviation 17 16  Wrist pronation [facility] WFL  Wrist supination 70 70  (Blank rows = not tested) UPPER EXTREMITY MMT:     *unable to test as pt is unable to hold positions against gravity (05/22/22)  MMT Right eval  Shoulder flexion   Shoulder abduction   Shoulder adduction   Shoulder extension   Shoulder internal rotation   Shoulder external rotation   Middle trapezius   Lower trapezius   Elbow flexion   Elbow extension   Wrist flexion   Wrist extension   Wrist ulnar deviation   Wrist radial deviation   Wrist pronation   Wrist supination   (Blank rows = not tested)  HAND FUNCTION: Grip strength: Right: -- lbs; Left: -- lbs, Lateral pinch: Right: -- lbs, Left: -- lbs, and 3 point pinch: Right: -- lbs, Left: -- lbs  COORDINATION: Box and Blocks:  Right --blocks, Left --blocks  SENSATION: Light touch: Impaired   EDEMA: Moderate swelling noted in the Right hand and wrist  MUSCLE TONE: RUE:  Mild  COGNITION: Overall cognitive status: Impaired  VISION: Subjective report: With onset of CVA pt reports blurriness Baseline vision:  Supposed to wear glasses, does not have them anymore Visual history:  No pertinent history  VISION ASSESSMENT: Reading acuity: Some blurriness in the beginning, now he reports at his baseline.  PRAXIS: Impaired: Motor planning  OBSERVATIONS: Limited coordination and ROM in the RUE   TODAY'S TREATMENT:  DATE:  05/29/22   05/22/22 -P/ROM: shoulder flexion, shoulder abduction, ER/IR, elbow flexion/extension, wrist flexion/extension, ulnar/radial deviation, supination/pronation, digit composite flexion, x10 -A/ROM: shoulder flexion, shoulder abduction, ER/IR, elbow flexion/extension, wrist flexion/extension, ulnar/radial deviation, supination/pronation, digit composite flexion, x8 -Measurements for progress note -Cones: reaching, grasping, and stacking/unstacking cones, 2x6, mod assist for stabilizing cones and placing them in a position for ease of reaching the top of the stack and sliding them down.  -edema massage on all digits, wrist, and forearm, pushing/massaging edema up the arm, in order to reduce swelling and improve ROM in the wrist and digits.   05/14/22 -A/ROM: Wrist flexion/extension, supination/pronation, digit composite flexion, x10 -E-Stim: [place] setting, 44 amps, 10/10 pattern, 10 mins, wrist extension. Last 4 mins working on digit composite flexion while in wrist flexion -Towel scrunches: x10 -Holding small foam roll between BUE, pt completing protraction, flexion, x15, able to readjust his hands on the foam roll independently with increased time.   05/12/22 -P/ROM: Wrist flexion/extension, supination/pronation, digit composite flexion, x10 -A/ROM: Wrist flexion/extension, supination/pronation, digit composite  flexion, x10 -Weight bearing in quadruped:  -weight shifting 2x60"  -Weight on RUE, Reaching forward with LUE to place large pegs on a peg board in front of him -E-Stim: [place] setting, 34 amps, 10/10 pattern, 10 mins, wrist extension. Last 4 mins working on digit composite flexion while in wrist extension and digit extension while in wrist flexion.    PATIENT EDUCATION: Education details: Reviewed HEP Person educated: Patient and Spouse Education method: Explanation, Demonstration, and Handouts Education comprehension: verbalized understanding and returned demonstration  HOME EXERCISE PROGRAM: 12/27: AA/ROM 12/29: A/ROM of shoulder, wrist, and fingers 1/3: Digit ROM 1/14: Weight bearing 1/18: Towel Scrunches   GOALS: Goals reviewed with patient? Yes  SHORT TERM GOALS: Target date: 05/15/22  Pt will be educated on HEP to improve ability to actively use RUE during functional task completion.  Goal status: IN PROGRESS  2.  Pt will increase A/ROM in RUE to [facility] in order to improve ability to perform dressing tasks with minimal compensatory strategies.  Goal status: IN PROGRESS  3.  Pt will increase strength in the RUE to 4-/5 in order to improve ability to actively assist the LUE in lifting tasks required during grooming and bathing.  Goal status: IN PROGRESS  4.  Pt will decrease pain in the RUE to 2/10 or less in order to sleep for 3+ consecutive hours without waking due to pain.  Goal status: IN PROGRESS  5.  Pt will be educated in desensitization techniques to improve tolerance to clothing on the RUE.  Goal status: IN PROGRESS  6.  Pt will increase ROM in right digits to improve ability to form a full grasp required for holding items. Goal status: IN PROGRESS  LONG TERM GOALS: Target date: 06/05/22  Pt will be educated on and demonstrate independence in use of AE during meal preparation tasks, as well as self feeding as needed. Goal status: IN PROGRESS  2.  Pt will  improve strength in RUE to 4+/5 or better in order to independently complete lifting tasks required by cooking and cleaning activities.  Goal status: IN PROGRESS  3.  Pt will increase fine motor coordination in RUE by completing 9 hole peg test in under 1 min to improve ability to perform dressing tasks including operating buttons and zippers.  Goal status: IN PROGRESS  4.  Pt will increase right grip strength by 10# and pinch strength by 3# to improve ability to grasp and  hold pots and pans during meal preparation. Goal status: IN PROGRESS   ASSESSMENT:  CLINICAL IMPRESSION: Pt was seen this session for a progress note. He is making incremental progress with his A/ROM, however he presents with increased tone in his entire RUE. This session, due to increased swelling in the RUE, as well as stiffness, OT focused session on P/ROM for stretching and edema massage to reduce swelling. OT provided extensive education on importance of completing HEP provided at home, as well as attempting to use his arm/hand functionally. Verbal and tactile cuing provided for technique and positioning, as well as mod-max assist provided throughout.    PLAN:  OT FREQUENCY: 2x/week  OT DURATION: 6 weeks  PLANNED INTERVENTIONS: self care/ADL training, therapeutic exercise, therapeutic activity, neuromuscular re-education, manual therapy, passive range of motion, functional mobility training, electrical stimulation, ultrasound, moist heat, cryotherapy, patient/family education, cognitive remediation/compensation, and DME and/or AE instructions  RECOMMENDED OTHER SERVICES: PT and Speech  CONSULTED AND AGREED WITH PLAN OF CARE: Patient and family member/caregiver  PLAN FOR NEXT SESSION: P/ROM, AA/ROM, A/ROM, E-stim, closed chain exercises, weight bearing, gripping tasks, supine AA/ROM    Trish Mage, OTR/L [phone] 05/29/2022, 1:00 PM

## 2022-06-02 ENCOUNTER — Ambulatory Visit (HOSPITAL_COMMUNITY): Payer: Medicare HMO | Admitting: Occupational Therapy

## 2022-06-02 ENCOUNTER — Encounter (HOSPITAL_COMMUNITY): Payer: Self-pay | Admitting: Occupational Therapy

## 2022-06-02 DIAGNOSIS — I69351 Hemiplegia and hemiparesis following cerebral infarction affecting right dominant side: Secondary | ICD-10-CM

## 2022-06-02 DIAGNOSIS — R29818 Other symptoms and signs involving the nervous system: Secondary | ICD-10-CM

## 2022-06-02 DIAGNOSIS — R278 Other lack of coordination: Secondary | ICD-10-CM | POA: Diagnosis not present

## 2022-06-02 NOTE — Therapy (Addendum)
OUTPATIENT OCCUPATIONAL THERAPY NEURO TREATMENT NOTE  AND DISCHARGE NOTE  Patient Name: Terry Mayo MRN: [id number] DOB:29-Nov-1955, 67 y.o., male Today's Date: 05/01/2022  PCP: Annye Asa, MD REFERRING PROVIDER: Annye Asa, MD  Discharge Note: Pt will be discharged from OT at this time.  He was unable to return for therapy at this time.    END OF SESSION:  OT End of Session - 06/02/22 1301     Visit Number 12    Number of Visits 13    Date for OT Re-Evaluation 06/05/22    Authorization Type [employer], $20 copay    Authorization - Number of Visits 12    Progress Note Due on Visit 10    OT Start Time 1300    OT Stop Time 1345    OT Time Calculation (min) 45 min    Activity Tolerance Patient tolerated treatment well    Behavior During Therapy WFL for tasks assessed/performed              Past Medical History:  Diagnosis Date   Arthritis    Essential hypertension    Hyperlipidemia    Kidney stones    MI (myocardial infarction) (HCC)    Stroke [facility])    Past Surgical History:  Procedure Laterality Date   CHOLECYSTECTOMY  1990s   COLONOSCOPY WITH PROPOFOL N/A 09/30/2015   Procedure: COLONOSCOPY WITH PROPOFOL;  Surgeon: Corbin Ade, MD;  [facility];  Service: Endoscopy;  Laterality: N/A;  930   IR CT HEAD LTD  12/10/2021   IR PERCUTANEOUS ART THROMBECTOMY/INFUSION INTRACRANIAL INC DIAG ANGIO  12/10/2021   IR US GUIDE VASC ACCESS RIGHT  12/10/2021   KIDNEY STONE SURGERY  2005   LEFT HEART CATH AND CORONARY ANGIOGRAPHY N/A 12/04/2021   Procedure: LEFT HEART CATH AND CORONARY ANGIOGRAPHY;  Surgeon: Lyn Records, MD;  [facility] INV[facility];  Service: Cardiovascular;  Laterality: N/A;   LOOP RECORDER INSERTION N/A 12/16/2021   Procedure: LOOP RECORDER INSERTION;  Surgeon: Regan Lemming, MD;  Location: [facility] INV[facility];  Service: Cardiovascular;  Laterality: N/A;   RADIOLOGY WITH ANESTHESIA N/A 12/10/2021   Procedure: IR WITH  ANESTHESIA;  Surgeon: Julieanne Cotton, MD;  Location: [facility] OR;  Service: Radiology;  Laterality: N/A;   Patient Active Problem List   Diagnosis Date Noted   Constipation 05/05/2022   Esophagitis 12/26/2021   Right hemiplegia (HCC) 12/17/2021   History of ischemic left ICA stroke 12/10/2021   Acute left ICA ischemic stroke (HCC) 12/10/2021   CAD (coronary artery disease) 12/05/2021   Ischemic cardiomyopathy 12/05/2021   NSVT (nonsustained ventricular tachycardia) (HCC) 12/05/2021   Hypertension 12/05/2021   Hyperlipidemia 12/05/2021   NSTEMI (non-ST elevated myocardial infarction) (HCC) 12/04/2021   Long term (current) use of antithrombotics/antiplatelets    Loss of weight 09/04/2015   Encounter for screening colonoscopy 09/04/2015    ONSET DATE: 12/10/21  REFERRING DIAG: L CVA  THERAPY DIAG:  Other lack of coordination  Other symptoms and signs involving the nervous system  Hemiplegia and hemiparesis following cerebral infarction affecting right dominant side (HCC)  Rationale for Evaluation and Treatment: Rehabilitation  SUBJECTIVE:   SUBJECTIVE STATEMENT: "I get my arm up and it just falls"  PERTINENT HISTORY: Pt had a heart attack and was hospitalized. Once he discharged home a few days, he presented with slurred speech, balance deficits, and R sided weakness. Pt diagnosed with L ischemic Stroke. He went to [facility] for Rehab for several  weeks.  PRECAUTIONS: Fall  WEIGHT BEARING RESTRICTIONS: No  PAIN:  Are you having pain? No  FALLS: Has patient fallen in last 6 months? Yes. Number of falls 1 - when he had the stroke  PATIENT GOALS: To get the hand working "right"  OBJECTIVE:   HAND DOMINANCE: Left  ADLs: Overall ADLs: Pt reports difficulty with dressing, unable to do any fasteners/zippers, Pt unable to shave or fix his hair, pt unable to do any IADL's at this time. Per wife's report she helps pt around 90% with all of his daily care.   MOBILITY STATUS:  Independent  POSTURE COMMENTS:  No Significant postural limitations Sitting balance: Moves/returns truncal midpoint >2 inches in all planes  ACTIVITY TOLERANCE: Activity tolerance: Pt having some increased fatigue.  FUNCTIONAL OUTCOME MEASURES: Quick Dash: 85.0%  UPPER EXTREMITY ROM:    Passive ROM Right eval  Shoulder flexion 100  Shoulder abduction 89  Shoulder internal rotation 90  Shoulder external rotation 41  Elbow flexion 139  Elbow extension 8  Wrist flexion 60  Wrist extension 32  Wrist ulnar deviation 13  Wrist radial deviation 28  Wrist pronation 63  Wrist supination WFL  (Blank rows = not tested)   Active ROM Right eval Right 05/22/22  Shoulder flexion 40 48  Shoulder abduction 56 53  Shoulder internal rotation 90 90  Shoulder external rotation 30 25  Elbow flexion 122 125  Elbow extension 6 5  Wrist flexion 54 65  Wrist extension 10 10  Wrist ulnar deviation 10 21  Wrist radial deviation 17 16  Wrist pronation [facility] WFL  Wrist supination 70 70  (Blank rows = not tested) UPPER EXTREMITY MMT:     *unable to test as pt is unable to hold positions against gravity (05/22/22)  MMT Right eval  Shoulder flexion   Shoulder abduction   Shoulder adduction   Shoulder extension   Shoulder internal rotation   Shoulder external rotation   Middle trapezius   Lower trapezius   Elbow flexion   Elbow extension   Wrist flexion   Wrist extension   Wrist ulnar deviation   Wrist radial deviation   Wrist pronation   Wrist supination   (Blank rows = not tested)  HAND FUNCTION: Grip strength: Right: -- lbs; Left: -- lbs, Lateral pinch: Right: -- lbs, Left: -- lbs, and 3 point pinch: Right: -- lbs, Left: -- lbs  COORDINATION: Box and Blocks:  Right --blocks, Left --blocks  SENSATION: Light touch: Impaired   EDEMA: Moderate swelling noted in the Right hand and wrist  MUSCLE TONE: RUE: Mild  COGNITION: Overall cognitive status:  Impaired  VISION: Subjective report: With onset of CVA pt reports blurriness Baseline vision:  Supposed to wear glasses, does not have them anymore Visual history:  No pertinent history  VISION ASSESSMENT: Reading acuity: Some blurriness in the beginning, now he reports at his baseline.  PRAXIS: Impaired: Motor planning  OBSERVATIONS: Limited coordination and ROM in the RUE   TODAY'S TREATMENT:  DATE:  06/02/22 -Box and Blocks: , 5 blocks, OT providing min assist to lift arm up and over 3in barrier -2lb wrist weight donned for entirety of session for proprioceptive input as well as functional strength -Small ball rolls: hand on ball rolling forwards and backwards (wrist flexion/extension), side to side (ulnar/radial deviation), circles, x15 each -Wrist strengthening: 2lb dumbbell, RUE elevated on stroller, flexion/extension in neutral, ulnar/radial deviation in pronation, supination/pronation, min assist grasping dumbbell -Punching: forward and in flexion x10 each -Sustained holds: elbow flexed, shoulder abducted, shoulder flexed, x10 each holding for 5-10" each  05/29/22 -P/ROM: shoulder flexion, shoulder abduction, ER/IR, elbow flexion/extension, wrist flexion/extension, ulnar/radial deviation, supination/pronation, digit composite flexion, x10 -A/ROM: shoulder flexion, shoulder abduction, ER/IR, elbow flexion/extension, wrist flexion/extension, ulnar/radial deviation, supination/pronation, digit composite flexion, x8 -Edema massage to work the fluid back up his arm from his fingers and hand. Provided new edema glove and flexion glove.  -cones: grasping and stacking cones 2x6 -Bilateral work: holding a ball between BUE, chest press, chest flexion, V ups, throwing and catching the ball  05/22/22 -P/ROM: shoulder flexion, shoulder abduction, ER/IR, elbow  flexion/extension, wrist flexion/extension, ulnar/radial deviation, supination/pronation, digit composite flexion, x10 -A/ROM: shoulder flexion, shoulder abduction, ER/IR, elbow flexion/extension, wrist flexion/extension, ulnar/radial deviation, supination/pronation, digit composite flexion, x8 -Measurements for progress note -Cones: reaching, grasping, and stacking/unstacking cones, 2x6, mod assist for stabilizing cones and placing them in a position for ease of reaching the top of the stack and sliding them down.  -edema massage on all digits, wrist, and forearm, pushing/massaging edema up the arm, in order to reduce swelling and improve ROM in the wrist and digits.      PATIENT EDUCATION: Education details: Sustained holds during A/ROM Person educated: Patient and Spouse Education method: Explanation, Facilities manager, and Handouts Education comprehension: verbalized understanding and returned demonstration  HOME EXERCISE PROGRAM: 12/27: AA/ROM 12/29: A/ROM of shoulder, wrist, and fingers 1/3: Digit ROM 1/14: Weight bearing 1/18: Towel Scrunches 2/6: Sustained holds during A/ROM   GOALS: Goals reviewed with patient? Yes  SHORT TERM GOALS: Target date: 05/15/22  Pt will be educated on HEP to improve ability to actively use RUE during functional task completion.  Goal status: IN PROGRESS  2.  Pt will increase A/ROM in RUE to [facility] in order to improve ability to perform dressing tasks with minimal compensatory strategies.  Goal status: IN PROGRESS  3.  Pt will increase strength in the RUE to 4-/5 in order to improve ability to actively assist the LUE in lifting tasks required during grooming and bathing.  Goal status: IN PROGRESS  4.  Pt will decrease pain in the RUE to 2/10 or less in order to sleep for 3+ consecutive hours without waking due to pain.  Goal status: IN PROGRESS  5.  Pt will be educated in desensitization techniques to improve tolerance to clothing on the RUE.   Goal status: IN PROGRESS  6.  Pt will increase ROM in right digits to improve ability to form a full grasp required for holding items. Goal status: IN PROGRESS  LONG TERM GOALS: Target date: 06/05/22  Pt will be educated on and demonstrate independence in use of AE during meal preparation tasks, as well as self feeding as needed. Goal status: IN PROGRESS  2.  Pt will improve strength in RUE to 4+/5 or better in order to independently complete lifting tasks required by cooking and cleaning activities.  Goal status: IN PROGRESS  3.  Pt will increase fine motor coordination in RUE by  completing 9 hole peg test in under 1 min to improve ability to perform dressing tasks including operating buttons and zippers.  Goal status: IN PROGRESS  4.  Pt will increase right grip strength by 10# and pinch strength by 3# to improve ability to grasp and hold pots and pans during meal preparation. Goal status: IN PROGRESS   ASSESSMENT:  CLINICAL IMPRESSION: This session patient continues to work on active movements and functional movements. OT applied a 2lb wrist weight for entire session to encourage increasing strength, as well as applying proprioceptive input on the RUE. He continues to report that he has difficulty sustaining any form of lifting his arm and demonstrates his arm dropping as he attempts to hold it up. To address this concern, pt and OT worked on lifting his arm and holding the lift for 5-10 seconds. Throughout the session OT is providing up to mod A to assist with lifting and moving his RUE, as well as verbal and tactile cuing for positioning and technique.    PLAN:  OT FREQUENCY: 2x/week  OT DURATION: 6 weeks  PLANNED INTERVENTIONS: self care/ADL training, therapeutic exercise, therapeutic activity, neuromuscular re-education, manual therapy, passive range of motion, functional mobility training, electrical stimulation, ultrasound, moist heat, cryotherapy, patient/family education,  cognitive remediation/compensation, and DME and/or AE instructions  RECOMMENDED OTHER SERVICES: PT and Speech  CONSULTED AND AGREED WITH PLAN OF CARE: Patient and family member/caregiver  PLAN FOR NEXT SESSION: P/ROM, AA/ROM, A/ROM, E-stim, closed chain exercises, weight bearing, gripping tasks, supine AA/ROM    Trish Mage, OTR/L [phone] 06/02/2022, 1:45 PM

## 2022-06-07 LAB — CUP PACEART REMOTE DEVICE CHECK
Date Time Interrogation Session: 20240209230853
Implantable Pulse Generator Implant Date: 20230822

## 2022-06-08 ENCOUNTER — Ambulatory Visit (INDEPENDENT_AMBULATORY_CARE_PROVIDER_SITE_OTHER): Payer: Medicare HMO

## 2022-06-08 ENCOUNTER — Other Ambulatory Visit (HOSPITAL_COMMUNITY): Payer: Self-pay

## 2022-06-08 DIAGNOSIS — I63232 Cerebral infarction due to unspecified occlusion or stenosis of left carotid arteries: Secondary | ICD-10-CM | POA: Diagnosis not present

## 2022-06-09 NOTE — Progress Notes (Addendum)
Carelink Summary Report / Loop Recorder 

## 2022-06-10 ENCOUNTER — Other Ambulatory Visit (HOSPITAL_COMMUNITY): Payer: Self-pay

## 2022-06-10 NOTE — Telephone Encounter (Signed)
Pharmacy Patient Advocate Encounter   Received notification from [facility] that prior authorization for Xeomin 200 units solution is needed.  PA submitted on 06/10/2022 to (ins) [employer] via CoverMyMeds Key [id number] Status is pending

## 2022-06-10 NOTE — Telephone Encounter (Signed)
Submitted a Benefits verification via Xeomin portal.

## 2022-06-15 ENCOUNTER — Other Ambulatory Visit (HOSPITAL_COMMUNITY): Payer: Self-pay

## 2022-06-15 ENCOUNTER — Ambulatory Visit (INDEPENDENT_AMBULATORY_CARE_PROVIDER_SITE_OTHER): Payer: Medicare HMO | Admitting: Neurology

## 2022-06-15 ENCOUNTER — Other Ambulatory Visit: Payer: Self-pay

## 2022-06-15 ENCOUNTER — Encounter: Payer: Self-pay | Admitting: Neurology

## 2022-06-15 VITALS — BP 119/78 | HR 69 | Ht 74.0 in | Wt 188.0 lb

## 2022-06-15 DIAGNOSIS — I639 Cerebral infarction, unspecified: Secondary | ICD-10-CM | POA: Diagnosis not present

## 2022-06-15 DIAGNOSIS — M7989 Other specified soft tissue disorders: Secondary | ICD-10-CM

## 2022-06-15 MED ORDER — XEOMIN 100 UNITS IM SOLR
600.0000 [IU] | INTRAMUSCULAR | 0 refills | Status: AC
  Filled 2022-06-15: qty 6, 90d supply, fill #0

## 2022-06-15 NOTE — Telephone Encounter (Signed)
Rx for Xeomin sent, ok to schedule shipment.

## 2022-06-15 NOTE — Telephone Encounter (Signed)
Pharmacy Patient Advocate Encounter  Prior Authorization for Xeomin 200UNIT solution has been approved.    PA# PA Case ID #: [id number] Effective dates: 04/27/2022 through 04/27/2023 This will be [employer] and [employer]

## 2022-06-15 NOTE — Progress Notes (Unsigned)
Chief Complaint  Patient presents with   Follow-up    Rm 15. Accompanied by wife. f/u per Dr. Leonie Mayo to discuss Xeomin injections.      ASSESSMENT AND PLAN  Terry Mayo is a 67 y.o. 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,  With residual spastic right upper extremity more than lower extremity weakness, painful right shoulder, elbow on passive stretch  EMG guided xeomin injection for spastic right upper extremity, asked for 400 units  Vascular risk factor of aging, hypertension, hyperlipidemia, history of smoker, coronary artery disease, on dual antiplatelet agent aspirin 81+ Plavix 75 on 27 November 2022,  Status post loop recorder Right arm swelling  Venous Doppler to rule out right upper extremity DVT   DIAGNOSTIC DATA (LABS, IMAGING, TESTING) - I reviewed patient records, labs, notes, testing and imaging myself where available.   MEDICAL HISTORY:  Terry Mayo, seen in request by   Terry Fila, MD [place],  [place] [address], Terry Schwalbe, MD   I reviewed and summarized the referring note. PMHX. HTN HLD History of smoke CAD S/p Loop recorder    I reviewed his cardiologist Dr. Dellia Mayo evaluation on March 11, 2022, non-ST MI on December 04, 2021, with multivessel CAD, ejection fraction 45 to 50%, status post loop recorder placement for atrial fibrillation surveillance,  Patient statin has to be stopped due to elevation of AST and ALT, now on Repatha   He is currently on dual antiplatelet agent aspirin 81 mg and Plavix 75 mg, the decision was to total duration for 1 year,    Past Medical History:  Diagnosis Date   Arthritis    Essential hypertension    Hyperlipidemia    Kidney stones    MI (myocardial infarction) ([place])    Stroke [facility])    Past Surgical History:  Procedure Laterality Date   CHOLECYSTECTOMY  1990s   COLONOSCOPY WITH PROPOFOL N/A 09/30/2015   Procedure: COLONOSCOPY WITH PROPOFOL;  Surgeon: Terry Dolin, MD;   [facility];  Service: Endoscopy;  Laterality: N/A;  930   IR CT HEAD LTD  12/10/2021   IR PERCUTANEOUS ART THROMBECTOMY/INFUSION INTRACRANIAL INC DIAG ANGIO  12/10/2021   IR US GUIDE VASC ACCESS RIGHT  12/10/2021   KIDNEY STONE SURGERY  2005   LEFT HEART CATH AND CORONARY ANGIOGRAPHY N/A 12/04/2021   Procedure: LEFT HEART CATH AND CORONARY ANGIOGRAPHY;  Surgeon: Terry Crome, MD;  Location: [place] CV LAB;  Service: Cardiovascular;  Laterality: N/A;   LOOP RECORDER INSERTION N/A 12/16/2021   Procedure: LOOP RECORDER INSERTION;  Surgeon: Terry Haw, MD;  Location: [place] CV LAB;  Service: Cardiovascular;  Laterality: N/A;   RADIOLOGY WITH ANESTHESIA N/A 12/10/2021   Procedure: IR WITH ANESTHESIA;  Surgeon: Terry Bras, MD;  Location: [place];  Service: Radiology;  Laterality: N/A;     PHYSICAL EXAM:   Vitals:   06/15/22 1251  BP: 119/78  Pulse: 69  Weight: 188 lb (85.3 kg)  Height: 6' 2"$  (1.88 m)   Not recorded     Body mass index is 24.14 kg/m.  PHYSICAL EXAMNIATION:  Gen: NAD, conversant, well nourised, well groomed                     Cardiovascular: Regular rate rhythm, no peripheral edema, warm, nontender. Eyes: Conjunctivae clear without exudates or hemorrhage Neck: Supple, no carotid bruits. Pulmonary: Clear to auscultation bilaterally   NEUROLOGICAL  EXAM:  MENTAL STATUS: Speech/cognition: Awake, alert, oriented to history taking and casual conversation, near normal comprehension and expression, CRANIAL NERVES: CN II: Visual fields are full to confrontation. Pupils are round equal and briskly reactive to light. CN III, IV, VI: extraocular movement are normal. No ptosis. CN V: Facial sensation is intact to light touch CN VII: Right lower face weakness, CN VIII: Hearing is normal to causal conversation. CN IX, X: Phonation is normal. CN XI: Head turning and shoulder shrug are intact  MOTOR: Right arm swelling, antigravity  movement of right shoulder abduction, right elbow flexion, extension, more profound distal right wrist hand weakness, no antigravity movement, mild right leg drift.  Painful right shoulder, elbow on passive stretch,  REFLEXES: Hyperreflexia of right upper extremity and bilateral lower extremity, bilateral plantar responses were extensor  SENSORY: Right hemiextinction,  COORDINATION: There is no trunk or limb dysmetria noted.  GAIT/STANCE: Push-up to get up from seated position, tendency to hold right arm in elbow flexion, dragging right leg mildly,  REVIEW OF SYSTEMS:  Full 14 system review of systems performed and notable only for as above All other review of systems were negative.   ALLERGIES: Allergies  Allergen Reactions   Penicillins     REACTION UNKNOWN Has patient had a PCN reaction causing immediate rash, facial/tongue/throat swelling, SOB or lightheadedness with hypotension: UNKNOWN Has patient had a PCN reaction causing severe rash involving mucus membranes or skin necrosis: UNKNOWN Has patient had a PCN reaction that required hospitalization UNKNOWN Has patient had a PCN reaction occurring within the last 10 years: UNKNOWN If all of the above answers are "NO", then may proceed with Cephalosporin use.     HOME MEDICATIONS: Current Outpatient Medications  Medication Sig Dispense Refill   aspirin EC 81 MG tablet Take 1 tablet (81 mg total) by mouth daily. Swallow whole. 30 tablet 3   busPIRone (BUSPAR) 7.5 MG tablet Take 1 tablet (7.5 mg total) by mouth 2 (two) times daily. 60 tablet 0   clopidogrel (PLAVIX) 75 MG tablet Take 1 tablet (75 mg total) by mouth daily with breakfast. 30 tablet 0   Evolocumab (REPATHA SURECLICK) [id number] MG/ML SOAJ Inject 140 mg into the skin every 14 days. 6 mL 3   ezetimibe (ZETIA) 10 MG tablet Take 1 tablet (10 mg total) by mouth daily. 90 tablet 3   hydrOXYzine (VISTARIL) 25 MG capsule Take 25 mg by mouth 2 (two) times daily as needed for  anxiety.     incobotulinumtoxinA (XEOMIN) 100 units SOLR injection Inject 600 Units into the muscle every 3 (three) months. 6 each 0   isosorbide dinitrate (ISORDIL) 20 MG tablet Take 1 tablet (20 mg total) by mouth 2 (two) times daily. 60 tablet 0   losartan (COZAAR) 25 MG tablet Take 1 tablet (25 mg total) by mouth daily. 30 tablet 0   metoprolol tartrate (LOPRESSOR) 25 MG tablet Take 0.5 tablets (12.5 mg total) by mouth 2 (two) times daily. 60 tablet 0   mirtazapine (REMERON) 15 MG tablet Take 15 mg by mouth at bedtime.     nitroGLYCERIN (NITROSTAT) 0.4 MG SL tablet Place 1 tablet (0.4 mg total) under the tongue every 5 (five) minutes x 3 doses as needed for chest pain. 20 tablet 0   omeprazole (PRILOSEC OTC) 20 MG tablet Take 20 mg by mouth as needed (acid reflux).     pantoprazole sodium (PROTONIX) 40 mg Take 40 mg by mouth daily. (Patient taking differently: Take 40 mg by mouth  as needed.) 30 each 0   spironolactone (ALDACTONE) 25 MG tablet Take 0.5 tablets (12.5 mg total) by mouth daily. 15 tablet 0   traZODone (DESYREL) 50 MG tablet 1 tablet at bedtime as needed Orally Once a day for 30 day(s)     predniSONE (DELTASONE) 20 MG tablet Take by mouth.     No current facility-administered medications for this visit.    PAST MEDICAL HISTORY: Past Medical History:  Diagnosis Date   Arthritis    Essential hypertension    Hyperlipidemia    Kidney stones    MI (myocardial infarction) ([place])    Stroke ([place])     PAST SURGICAL HISTORY: Past Surgical History:  Procedure Laterality Date   CHOLECYSTECTOMY  1990s   COLONOSCOPY WITH PROPOFOL N/A 09/30/2015   Procedure: COLONOSCOPY WITH PROPOFOL;  Surgeon: Terry Dolin, MD;  [facility];  Service: Endoscopy;  Laterality: N/A;  930   IR CT HEAD LTD  12/10/2021   IR PERCUTANEOUS ART THROMBECTOMY/INFUSION INTRACRANIAL INC DIAG ANGIO  12/10/2021   IR US GUIDE VASC ACCESS RIGHT  12/10/2021   KIDNEY STONE SURGERY  2005   LEFT HEART CATH  AND CORONARY ANGIOGRAPHY N/A 12/04/2021   Procedure: LEFT HEART CATH AND CORONARY ANGIOGRAPHY;  Surgeon: Terry Crome, MD;  Location: [place] CV LAB;  Service: Cardiovascular;  Laterality: N/A;   LOOP RECORDER INSERTION N/A 12/16/2021   Procedure: LOOP RECORDER INSERTION;  Surgeon: Terry Haw, MD;  Location: [place] CV LAB;  Service: Cardiovascular;  Laterality: N/A;   RADIOLOGY WITH ANESTHESIA N/A 12/10/2021   Procedure: IR WITH ANESTHESIA;  Surgeon: Terry Bras, MD;  Location: [place];  Service: Radiology;  Laterality: N/A;    FAMILY HISTORY: Family History  Problem Relation Age of Onset   Coronary artery disease Sister    Colon cancer Neg Hx     SOCIAL HISTORY: Social History   Socioeconomic History   Marital status: Single    Spouse name: Not on file   Number of children: Not on file   Years of education: Not on file   Highest education level: Not on file  Occupational History   Not on file  Tobacco Use   Smoking status: Former    Types: Cigarettes    Quit date: 08/05/2015    Years since quitting: 6.8    Passive exposure: Past   Smokeless tobacco: Never  Vaping Use   Vaping Use: Never used  Substance and Sexual Activity   Alcohol use: Not Currently    Alcohol/week: 0.0 standard drinks of alcohol    Comment: No ETOH in a month. Variable, up to daily, as much as much as a fifth of liquor per sitting.   Drug use: No    Frequency: 7.0 times per week    Comment: As often as daily when he does smoke marijuana   Sexual activity: Yes    Birth control/protection: None  Other Topics Concern   Not on file  Social History Narrative   Not on file   Social Determinants of Health   Financial Resource Strain: Not on file  Food Insecurity: Not on file  Transportation Needs: Not on file  Physical Activity: Not on file  Stress: Not on file  Social Connections: Not on file  Intimate Partner Violence: Not on file      Marcial Pacas, M.D. Ph.D.  [facility] [address] [place], [place] [address] Ph: [phone] Fax: [phone]  CC:  Terry Fila, MD [place],  [place] [address]  Terry Schwalbe, MD

## 2022-06-15 NOTE — Addendum Note (Signed)
Addended by: Verlin Grills on: 06/15/2022 08:55 AM   Modules accepted: Orders

## 2022-06-22 ENCOUNTER — Telehealth: Payer: Self-pay

## 2022-06-22 NOTE — Telephone Encounter (Signed)
Please obtain prior authorization for Xeomin 400 units electrical stimulation guided [id number]). J code: [id number] Chemical Denervation of Limbs and Trunk Muscles: [id number], [id number], [id number], [id number] Dx [id number], [phone]

## 2022-06-23 NOTE — Telephone Encounter (Signed)
We received a request for Xeomin 400 units from Dr. Krista Blue. Please disregard the request if 600 units has been approved.

## 2022-07-02 ENCOUNTER — Encounter: Payer: Self-pay | Admitting: *Deleted

## 2022-07-02 NOTE — Progress Notes (Signed)
KeyWandra Mannan) - 0000000 Repatha SureClick '140MG'$ /ML auto-injectors attempted but getting message (There was an error with your request Eligibility could not be verified for this patient - patient not found. Please review patient information and re-submit.)

## 2022-07-06 ENCOUNTER — Ambulatory Visit
Admission: RE | Admit: 2022-07-06 | Discharge: 2022-07-06 | Disposition: A | Discharge status: Home / Self Care | Payer: Medicare HMO | Source: Ambulatory Visit | Attending: Neurology | Admitting: Neurology

## 2022-07-06 DIAGNOSIS — M7989 Other specified soft tissue disorders: Secondary | ICD-10-CM

## 2022-07-06 DIAGNOSIS — I639 Cerebral infarction, unspecified: Secondary | ICD-10-CM

## 2022-07-09 ENCOUNTER — Telehealth: Payer: Self-pay

## 2022-07-09 NOTE — Telephone Encounter (Signed)
-----   Message from Marcial Pacas, MD sent at 07/08/2022  3:00 PM EDT ----- Please call patient, right upper extremity venous Doppler ultrasound showed no evidence of blood clot of right arm

## 2022-07-09 NOTE — Telephone Encounter (Signed)
I called patient.  I advised him that his right upper extremity Doppler ultrasound showed no evidence of a blood clot to his right arm.  Patient verbalized understanding and had no further questions or concerns.

## 2022-07-13 ENCOUNTER — Ambulatory Visit (HOSPITAL_COMMUNITY): Payer: Medicare HMO | Attending: Emergency Medicine

## 2022-07-13 DIAGNOSIS — I63232 Cerebral infarction due to unspecified occlusion or stenosis of left carotid arteries: Secondary | ICD-10-CM

## 2022-07-13 DIAGNOSIS — R29818 Other symptoms and signs involving the nervous system: Secondary | ICD-10-CM | POA: Insufficient documentation

## 2022-07-13 DIAGNOSIS — I69351 Hemiplegia and hemiparesis following cerebral infarction affecting right dominant side: Secondary | ICD-10-CM | POA: Insufficient documentation

## 2022-07-13 DIAGNOSIS — R278 Other lack of coordination: Secondary | ICD-10-CM | POA: Insufficient documentation

## 2022-07-14 LAB — CUP PACEART REMOTE DEVICE CHECK
Date Time Interrogation Session: 20240317231304
Implantable Pulse Generator Implant Date: 20230822

## 2022-07-22 NOTE — Progress Notes (Signed)
[facility] Summary Report / Loop Recorder 

## 2022-07-31 ENCOUNTER — Encounter: Payer: Self-pay | Admitting: Orthopedic Surgery

## 2022-07-31 ENCOUNTER — Ambulatory Visit (INDEPENDENT_AMBULATORY_CARE_PROVIDER_SITE_OTHER): Payer: Medicare HMO | Admitting: Orthopedic Surgery

## 2022-07-31 DIAGNOSIS — M6281 Muscle weakness (generalized): Secondary | ICD-10-CM

## 2022-07-31 DIAGNOSIS — I639 Cerebral infarction, unspecified: Secondary | ICD-10-CM | POA: Diagnosis not present

## 2022-07-31 NOTE — Addendum Note (Signed)
Addended byCaffie Damme on: 07/31/2022 10:39 AM   Modules accepted: Orders

## 2022-07-31 NOTE — Progress Notes (Signed)
Office Visit Note   Patient: Terry Mayo           Date of Birth: 1955/07/19           MRN: [id number] Visit Date: 07/31/2022 Requested by: Smith Robert, MD No address on file PCP: Smith Robert, MD (Inactive)  Subjective: Chief Complaint  Patient presents with   Arm Problem    Can't use right arm since CVA / Neurology has ordered ultrasound and botox injections     HPI: 67 year old male had a stroke about 8 months ago affected his right upper extremity presents for evaluation of swelling right hand inability to use right upper extremity              ROS: Nothing to add at this time  Assessment & Plan:  Images personally read and my interpretation : No x-rays  Visit Diagnoses:  1. Muscle weakness of right upper extremity   2. Cerebrovascular accident (CVA), unspecified mechanism     Plan: The patient needs to occupational therapy splint to keep his wrist in neutral and his hand in the functional position.  After 12 months he can be evaluated by hand surgery for any intervention such as tendon transfers to make his upper extremity more functional if needed  Follow-Up Instructions: No follow-ups on file.   Orders:  No orders of the defined types were placed in this encounter.     Objective: Vital Signs: There were no vitals taken for this visit.  Physical Exam Vitals and nursing note reviewed.  Constitutional:      Appearance: Normal appearance.  HENT:     Head: Normocephalic and atraumatic.  Eyes:     General: No scleral icterus.       Right eye: No discharge.        Left eye: No discharge.     Extraocular Movements: Extraocular movements intact.     Conjunctiva/sclera: Conjunctivae normal.     Pupils: Pupils are equal, round, and reactive to light.  Cardiovascular:     Rate and Rhythm: Normal rate.     Pulses: Normal pulses.  Skin:    General: Skin is warm and dry.     Capillary Refill: Capillary refill takes less than 2 seconds.  Neurological:      Mental Status: He is alert and oriented to person, place, and time.  Psychiatric:        Mood and Affect: Mood normal.        Behavior: Behavior normal.        Judgment: Judgment normal.      Right Hand Exam   Comments:  Right hand and upper extremity he has intact biceps function weak triceps extension weak wrist extension normal wrist flexion weak finger extension so at some mixed C6-7-8 deficit there is swelling of the hand.  He can feel sensation       Specialty Comments:  No specialty comments available.  Imaging: No results found.   PMFS History: Patient Active Problem List   Diagnosis Date Noted   Swollen limb 06/15/2022   Constipation 05/05/2022   Esophagitis 12/26/2021   Right hemiplegia 12/17/2021   History of ischemic left ICA stroke 12/10/2021   Cerebrovascular accident (CVA) 12/10/2021   CAD (coronary artery disease) 12/05/2021   Ischemic cardiomyopathy 12/05/2021   NSVT (nonsustained ventricular tachycardia) 12/05/2021   Hypertension 12/05/2021   Hyperlipidemia 12/05/2021   NSTEMI (non-ST elevated myocardial infarction) 12/04/2021   Long term (current) use of antithrombotics/antiplatelets  Loss of weight 09/04/2015   Encounter for screening colonoscopy 09/04/2015   Past Medical History:  Diagnosis Date   Arthritis    Essential hypertension    Hyperlipidemia    Kidney stones    MI (myocardial infarction)    Stroke     Family History  Problem Relation Age of Onset   Coronary artery disease Sister    Colon cancer Neg Hx     Past Surgical History:  Procedure Laterality Date   CHOLECYSTECTOMY  1990s   COLONOSCOPY WITH PROPOFOL N/A 09/30/2015   Procedure: COLONOSCOPY WITH PROPOFOL;  Surgeon: Corbin Adeobert M Rourk, MD;  [facility];  Service: Endoscopy;  Laterality: N/A;  930   IR CT HEAD LTD  12/10/2021   IR PERCUTANEOUS ART THROMBECTOMY/INFUSION INTRACRANIAL INC DIAG ANGIO  12/10/2021   IR US GUIDE VASC ACCESS RIGHT  12/10/2021   KIDNEY  STONE SURGERY  2005   LEFT HEART CATH AND CORONARY ANGIOGRAPHY N/A 12/04/2021   Procedure: LEFT HEART CATH AND CORONARY ANGIOGRAPHY;  Surgeon: Lyn RecordsSmith, Henry W, MD;  [facility];  Service: Cardiovascular;  Laterality: N/A;   LOOP RECORDER INSERTION N/A 12/16/2021   Procedure: LOOP RECORDER INSERTION;  Surgeon: Regan Lemmingamnitz, Will Martin, MD;  Location: [facility];  Service: Cardiovascular;  Laterality: N/A;   RADIOLOGY WITH ANESTHESIA N/A 12/10/2021   Procedure: IR WITH ANESTHESIA;  Surgeon: Julieanne Cottoneveshwar, Sanjeev, MD;  Location: [facility];  Service: Radiology;  Laterality: N/A;   Social History   Occupational History   Not on file  Tobacco Use   Smoking status: Former    Types: Cigarettes    Quit date: 08/05/2015    Years since quitting: 6.9    Passive exposure: Past   Smokeless tobacco: Never  Vaping Use   Vaping Use: Never used  Substance and Sexual Activity   Alcohol use: Not Currently    Alcohol/week: 0.0 standard drinks of alcohol    Comment: No ETOH in a month. Variable, up to daily, as much as much as a fifth of liquor per sitting.   Drug use: No    Frequency: 7.0 times per week    Comment: As often as daily when he does smoke marijuana   Sexual activity: Yes    Birth control/protection: None

## 2022-07-31 NOTE — Patient Instructions (Signed)
Physical therapy has been ordered for you at [facility]. They should call you to schedule, [phone] is the phone number to call, if you want to call to schedule.   

## 2022-08-05 ENCOUNTER — Ambulatory Visit (INDEPENDENT_AMBULATORY_CARE_PROVIDER_SITE_OTHER): Payer: Medicare HMO | Admitting: Neurology

## 2022-08-05 ENCOUNTER — Encounter: Payer: Self-pay | Admitting: Neurology

## 2022-08-05 VITALS — BP 117/73

## 2022-08-05 DIAGNOSIS — G8111 Spastic hemiplegia affecting right dominant side: Secondary | ICD-10-CM

## 2022-08-05 DIAGNOSIS — R252 Cramp and spasm: Secondary | ICD-10-CM | POA: Insufficient documentation

## 2022-08-05 DIAGNOSIS — G8191 Hemiplegia, unspecified affecting right dominant side: Secondary | ICD-10-CM

## 2022-08-05 MED ORDER — INCOBOTULINUMTOXINA 100 UNITS IM SOLR
400.0000 [IU] | INTRAMUSCULAR | Status: AC
  Administered 2022-08-05: 400 [IU] via INTRAMUSCULAR

## 2022-08-05 MED ORDER — INCOBOTULINUMTOXINA 100 UNITS IM SOLR: 400.0000 [IU] | INTRAMUSCULAR | Status: AC

## 2022-08-05 NOTE — Progress Notes (Signed)
Chief Complaint  Patient presents with   Procedure    Rm 14,Xeomin injection:green needle 100 unitsx 4vial Xeomin, pt agreeable wife present      ASSESSMENT AND PLAN  Terry Mayo is a 67 y.o. 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,  With residual spastic right upper extremity more than lower extremity weakness, painful right shoulder, elbow on passive stretch  EMG guided xeomin injection for spastic right upper extremity, asked for 400 units  Vascular risk factor of aging, hypertension, hyperlipidemia, history of smoker, coronary artery disease, on dual antiplatelet agent aspirin 81+ Plavix 75 on 27 November 2022,  Status post loop recorder  First electrical stimulation guided xeomin injection for spastic right upper extremity, use 400 units, (100 units was dissolved into 2 cc of normal saline)  Under electrical stimulation  Right pronator teres 50 units Right pectoralis major 50 units Right latissimus dorsi 50 units  Right brachialis 75 units Right flexor digitorum profundus  25 units Right flexor digitorum superficialis 25 units Right palmaris longus 25 units  DIAGNOSTIC DATA (LABS, IMAGING, TESTING) - I reviewed patient records, labs, notes, testing and imaging myself where available.   MEDICAL HISTORY:  Terry Mayo, is a 67 year old left-handed male, accompanied by his wife, seen in request by Dr. Pearlean BrownieSethi, Janalyn ShyPramod S, for evaluation of botulism toxin injection for spastic right upper extremity, initial evaluation was on June 15, 2022, his primary care physician is Dr. Marcene BrawnJoyner-Powell, Nicole, MD   I reviewed and summarized the referring note. PMHX. HTN HLD History of smoke CAD S/p Loop recorder   He suffered heart attack on December 04 2021, on December 09, 2021, developed acute onset of aphasia, right-sided weakness, was given TNK, mechanical thrombectomy for left ICA terminus occlusion, was able to achieve complete recanalization, status post loop recorder placement  December 16, 2021  He is now back home, ambulated without assistant, only mild language difficulty, most bothersome symptoms is his right arm, recent worsening swelling, painful upon passive stretch, mainly involving right shoulder and elbow, significant distal right upper extremity weakness  I reviewed his cardiologist Dr. Jenene SlickerMallipeddi evaluation on March 11, 2022, non-ST MI on December 04, 2021, with multivessel CAD, ejection fraction 45 to 50%, status post loop recorder placement for atrial fibrillation surveillance,  Patient statin has to be stopped due to elevation of AST and ALT, now on Repatha   He is currently on dual antiplatelet agent aspirin 81 mg and Plavix 75 mg, the decision was to total duration for 1 year,  Update August 05, 2022: This is his first electrical stimulation guided xeomin injection for spastic right upper extremity, use 400 units, potential side effect explained, consent form was signed,  He has painful right shoulder, elbow with passive stretch, walking okay,  PHYSICAL EXAM:   Vitals:   08/05/22 1326  BP: 117/73   MOTOR: Right arm has antigravity movement of right shoulder abduction, right elbow flexion, extension, more profound distal right wrist hand weakness, no antigravity movement, mild right leg drift.  Painful right shoulder, elbow on passive stretch,  GAIT/STANCE: Push-up to get up from seated position, tendency to hold right arm in elbow flexion, dragging right leg mildly,  REVIEW OF SYSTEMS:  Full 14 system review of systems performed and notable only for as above All other review of systems were negative.   ALLERGIES: Allergies  Allergen Reactions   Penicillins     REACTION UNKNOWN Has patient had a PCN reaction causing immediate rash, facial/tongue/throat swelling,  SOB or lightheadedness with hypotension: UNKNOWN Has patient had a PCN reaction causing severe rash involving mucus membranes or skin necrosis: UNKNOWN Has patient had a PCN  reaction that required hospitalization UNKNOWN Has patient had a PCN reaction occurring within the last 10 years: UNKNOWN If all of the above answers are "NO", then may proceed with Cephalosporin use.     HOME MEDICATIONS: Current Outpatient Medications  Medication Sig Dispense Refill   aspirin EC 81 MG tablet Take 1 tablet (81 mg total) by mouth daily. Swallow whole. 30 tablet 3   busPIRone (BUSPAR) 7.5 MG tablet Take 1 tablet (7.5 mg total) by mouth 2 (two) times daily. 60 tablet 0   clopidogrel (PLAVIX) 75 MG tablet Take 1 tablet (75 mg total) by mouth daily with breakfast. 30 tablet 0   Evolocumab (REPATHA SURECLICK) 140 MG/ML SOAJ Inject 140 mg into the skin every 14 days. 6 mL 3   ezetimibe (ZETIA) 10 MG tablet Take 1 tablet (10 mg total) by mouth daily. 90 tablet 3   hydrOXYzine (VISTARIL) 25 MG capsule Take 25 mg by mouth 2 (two) times daily as needed for anxiety.     incobotulinumtoxinA (XEOMIN) 100 units SOLR injection Inject 600 Units into the muscle every 3 (three) months. 6 each 0   isosorbide dinitrate (ISORDIL) 20 MG tablet Take 1 tablet (20 mg total) by mouth 2 (two) times daily. 60 tablet 0   losartan (COZAAR) 25 MG tablet Take 1 tablet (25 mg total) by mouth daily. 30 tablet 0   metoprolol tartrate (LOPRESSOR) 25 MG tablet Take 0.5 tablets (12.5 mg total) by mouth 2 (two) times daily. 60 tablet 0   mirtazapine (REMERON) 15 MG tablet Take 15 mg by mouth at bedtime.     nitroGLYCERIN (NITROSTAT) 0.4 MG SL tablet Place 1 tablet (0.4 mg total) under the tongue every 5 (five) minutes x 3 doses as needed for chest pain. 20 tablet 0   omeprazole (PRILOSEC OTC) 20 MG tablet Take 20 mg by mouth as needed (acid reflux).     pantoprazole sodium (PROTONIX) 40 mg Take 40 mg by mouth daily. (Patient taking differently: Take 40 mg by mouth as needed.) 30 each 0   predniSONE (DELTASONE) 20 MG tablet Take by mouth.     spironolactone (ALDACTONE) 25 MG tablet Take 0.5 tablets (12.5 mg total)  by mouth daily. 15 tablet 0   traZODone (DESYREL) 50 MG tablet 1 tablet at bedtime as needed Orally Once a day for 30 day(s)     Current Facility-Administered Medications  Medication Dose Route Frequency Provider Last Rate Last Admin   incobotulinumtoxinA (XEOMIN) 100 units injection 400 Units  400 Units Intramuscular Q90 days Levert Feinstein, MD        PAST MEDICAL HISTORY: Past Medical History:  Diagnosis Date   Arthritis    Essential hypertension    Hyperlipidemia    Kidney stones    MI (myocardial infarction)    Stroke     PAST SURGICAL HISTORY: Past Surgical History:  Procedure Laterality Date   CHOLECYSTECTOMY  1990s   COLONOSCOPY WITH PROPOFOL N/A 09/30/2015   Procedure: COLONOSCOPY WITH PROPOFOL;  Surgeon: Corbin Ade, MD;  [facility];  Service: Endoscopy;  Laterality: N/A;  930   IR CT HEAD LTD  12/10/2021   IR PERCUTANEOUS ART THROMBECTOMY/INFUSION INTRACRANIAL INC DIAG ANGIO  12/10/2021   IR US GUIDE VASC ACCESS RIGHT  12/10/2021   KIDNEY STONE SURGERY  2005   LEFT HEART  CATH AND CORONARY ANGIOGRAPHY N/A 12/04/2021   Procedure: LEFT HEART CATH AND CORONARY ANGIOGRAPHY;  Surgeon: Lyn Records, MD;  Location: [facility] INVASIVE CV LAB;  Service: Cardiovascular;  Laterality: N/A;   LOOP RECORDER INSERTION N/A 12/16/2021   Procedure: LOOP RECORDER INSERTION;  Surgeon: Regan Lemming, MD;  [facility]: [facility];  Service: Cardiovascular;  Laterality: N/A;   RADIOLOGY WITH ANESTHESIA N/A 12/10/2021   Procedure: IR WITH ANESTHESIA;  Surgeon: Julieanne Cotton, MD;  Location: [facility];  Service: Radiology;  Laterality: N/A;    FAMILY HISTORY: Family History  Problem Relation Age of Onset   Coronary artery disease Sister    Colon cancer Neg Hx     SOCIAL HISTORY: Social History   Socioeconomic History   Marital status: Single    Spouse name: Not on file   Number of children: Not on file   Years of education: Not on file   Highest education level: Not on  file  Occupational History   Not on file  Tobacco Use   Smoking status: Former    Types: Cigarettes    Quit date: 08/05/2015    Years since quitting: 7.0    Passive exposure: Past   Smokeless tobacco: Never  Vaping Use   Vaping Use: Never used  Substance and Sexual Activity   Alcohol use: Not Currently    Alcohol/week: 0.0 standard drinks of alcohol    Comment: No ETOH in a month. Variable, up to daily, as much as much as a fifth of liquor per sitting.   Drug use: No    Frequency: 7.0 times per week    Comment: As often as daily when he does smoke marijuana   Sexual activity: Yes    Birth control/protection: None  Other Topics Concern   Not on file  Social History Narrative   Not on file   Social Determinants of Health   Financial Resource Strain: Not on file  Food Insecurity: Not on file  Transportation Needs: Not on file  Physical Activity: Not on file  Stress: Not on file  Social Connections: Not on file  Intimate Partner Violence: Not on file      Levert Feinstein, M.D. Ph.D.  [facility] [address] [place], [place] [address] Ph: [phone] Fax: [phone]  CC:  Smith Robert, MD No address on file  Marcene Brawn, MD

## 2022-08-05 NOTE — Progress Notes (Signed)
Xeomin- 100 units x 4 vial Lot: [id number] Expiration: 06.2026 NDC: 0259-1610-01  Bacteriostatic 0.9% Sodium Chloride- 4 mL total Lot: [id number] Expiration: 04.30.24 NDC: 74163-845-36  Dx: I63.9  B/B Witnessed by Cinda Quest, CMA

## 2022-08-10 ENCOUNTER — Other Ambulatory Visit (HOSPITAL_COMMUNITY): Payer: Self-pay | Admitting: Nephrology

## 2022-08-10 DIAGNOSIS — N200 Calculus of kidney: Secondary | ICD-10-CM

## 2022-08-10 DIAGNOSIS — N281 Cyst of kidney, acquired: Secondary | ICD-10-CM

## 2022-08-10 DIAGNOSIS — I129 Hypertensive chronic kidney disease with stage 1 through stage 4 chronic kidney disease, or unspecified chronic kidney disease: Secondary | ICD-10-CM

## 2022-08-10 DIAGNOSIS — R319 Hematuria, unspecified: Secondary | ICD-10-CM

## 2022-08-10 DIAGNOSIS — N1831 Chronic kidney disease, stage 3a: Secondary | ICD-10-CM

## 2022-08-11 ENCOUNTER — Ambulatory Visit (HOSPITAL_COMMUNITY): Payer: Medicare HMO | Attending: Emergency Medicine | Admitting: Occupational Therapy

## 2022-08-11 DIAGNOSIS — R29818 Other symptoms and signs involving the nervous system: Secondary | ICD-10-CM | POA: Insufficient documentation

## 2022-08-11 DIAGNOSIS — I639 Cerebral infarction, unspecified: Secondary | ICD-10-CM | POA: Insufficient documentation

## 2022-08-11 DIAGNOSIS — M6281 Muscle weakness (generalized): Secondary | ICD-10-CM | POA: Insufficient documentation

## 2022-08-11 NOTE — Therapy (Signed)
[facility] Outpatient Rehabilitation at [facility] [address] [place], [place], [address] Phone: [phone]   Fax:  [phone]  Patient Details  Name: Terry Mayo MRN: [id number] Date of Birth: [birth year] Referring Provider:  [facility]  Encounter Date: 08/11/2022   Splint Visit Cancellation:  Pt arrived for splinting evaluation, reporting he was sent for a cast for his right hand. Upon assessment, pt is s/p CVA in 11/2021, familiar to this clinic. Pt demonstrating active wrist flexion, extension, radial and ulnar deviation. He is able to flex his fingers and make approximately 50% of a fist, and is able to fully extend all digits. Pt does have mild swelling in the hand, reports he wears an edema glove to support. Pt without contractures or significant tone in the right hand and wrist. Extensive discussion with pt regarding his right hand and wrist functioning. Educated pt that purpose of a splint is to support the hand/wrist or manage contractures. Pt verbalizing understanding and reports he does not have pain, contractures, or increased difficulty with mobility in the wrist and hand, and does not have difficulty with his hand and fingers curling at night or during the day. At this time, splinting is not recommended as he is able to activate both flexors and extensors to mobilize and support the RUE, and does not have evidence of contractures.   Recommend pt follow up with neurology and obtain a new referral for occupational therapy services to address RUE function post-CVA. Provided pt with the name and contact information of his neurologist, Dr. Delia Heady, as well as instructions to continue his HEP that was provided during prior episode of care. Pt agreeable and will contact our office with any questions.     Ezra Sites, OTR/L  [phone] 08/11/2022, 11:50 AM  [facility] Outpatient Rehabilitation at [facility] [address]  [place], [place], [address] Phone: [phone]   Fax:  [phone]

## 2022-08-17 ENCOUNTER — Ambulatory Visit (INDEPENDENT_AMBULATORY_CARE_PROVIDER_SITE_OTHER): Payer: Medicare HMO

## 2022-08-17 DIAGNOSIS — I63232 Cerebral infarction due to unspecified occlusion or stenosis of left carotid arteries: Secondary | ICD-10-CM

## 2022-08-17 LAB — CUP PACEART REMOTE DEVICE CHECK
Date Time Interrogation Session: 20240421230938
Implantable Pulse Generator Implant Date: 20230822

## 2022-08-24 ENCOUNTER — Ambulatory Visit (HOSPITAL_COMMUNITY)
Admission: RE | Admit: 2022-08-24 | Discharge: 2022-08-24 | Disposition: A | Discharge status: Home / Self Care | Payer: Medicare HMO | Source: Ambulatory Visit | Attending: Nephrology | Admitting: Nephrology

## 2022-08-24 DIAGNOSIS — N281 Cyst of kidney, acquired: Secondary | ICD-10-CM

## 2022-08-24 DIAGNOSIS — R319 Hematuria, unspecified: Secondary | ICD-10-CM | POA: Diagnosis present

## 2022-08-24 DIAGNOSIS — N200 Calculus of kidney: Secondary | ICD-10-CM | POA: Diagnosis present

## 2022-08-24 DIAGNOSIS — I129 Hypertensive chronic kidney disease with stage 1 through stage 4 chronic kidney disease, or unspecified chronic kidney disease: Secondary | ICD-10-CM | POA: Insufficient documentation

## 2022-08-24 DIAGNOSIS — N1831 Chronic kidney disease, stage 3a: Secondary | ICD-10-CM

## 2022-08-24 NOTE — Progress Notes (Signed)
[facility] Summary Report / Loop Recorder 

## 2022-09-09 ENCOUNTER — Encounter: Payer: Self-pay | Admitting: Neurology

## 2022-09-22 ENCOUNTER — Other Ambulatory Visit (HOSPITAL_COMMUNITY): Payer: Self-pay

## 2022-09-22 ENCOUNTER — Ambulatory Visit (HOSPITAL_COMMUNITY): Payer: Medicare HMO | Attending: Emergency Medicine

## 2022-09-22 DIAGNOSIS — R29818 Other symptoms and signs involving the nervous system: Secondary | ICD-10-CM | POA: Insufficient documentation

## 2022-09-22 DIAGNOSIS — M6281 Muscle weakness (generalized): Secondary | ICD-10-CM | POA: Insufficient documentation

## 2022-09-22 DIAGNOSIS — I639 Cerebral infarction, unspecified: Secondary | ICD-10-CM | POA: Diagnosis not present

## 2022-09-22 DIAGNOSIS — Z8673 Personal history of transient ischemic attack (TIA), and cerebral infarction without residual deficits: Secondary | ICD-10-CM | POA: Diagnosis not present

## 2022-09-22 LAB — CUP PACEART REMOTE DEVICE CHECK
Date Time Interrogation Session: 20240524230457
Implantable Pulse Generator Implant Date: 20230822

## 2022-09-22 NOTE — Progress Notes (Signed)
[facility] Summary Report / Loop Recorder 

## 2022-10-16 NOTE — Progress Notes (Signed)
[facility] Summary Report / Loop Recorder 

## 2022-10-22 LAB — CUP PACEART REMOTE DEVICE CHECK
Date Time Interrogation Session: 20240626230349
Implantable Pulse Generator Implant Date: 20230822

## 2022-10-26 ENCOUNTER — Ambulatory Visit (HOSPITAL_COMMUNITY): Payer: Medicare HMO | Attending: Emergency Medicine

## 2022-10-26 DIAGNOSIS — Z8673 Personal history of transient ischemic attack (TIA), and cerebral infarction without residual deficits: Secondary | ICD-10-CM | POA: Insufficient documentation

## 2022-11-12 NOTE — Progress Notes (Signed)
[facility] Summary Report / Loop Recorder 

## 2022-11-18 ENCOUNTER — Ambulatory Visit: Payer: Medicare HMO | Admitting: Neurology

## 2022-11-26 ENCOUNTER — Ambulatory Visit (INDEPENDENT_AMBULATORY_CARE_PROVIDER_SITE_OTHER): Payer: Medicare HMO | Admitting: Neurology

## 2022-11-26 VITALS — BP 114/68 | HR 50

## 2022-11-26 DIAGNOSIS — G8191 Hemiplegia, unspecified affecting right dominant side: Secondary | ICD-10-CM

## 2022-11-26 DIAGNOSIS — G8111 Spastic hemiplegia affecting right dominant side: Secondary | ICD-10-CM

## 2022-11-26 MED ORDER — INCOBOTULINUMTOXINA 100 UNITS IM SOLR
400.0000 [IU] | Freq: Once | INTRAMUSCULAR | Status: AC
Start: 2022-11-26 — End: 2022-11-30
  Administered 2022-11-30: 400 [IU] via INTRAMUSCULAR

## 2022-11-26 NOTE — Progress Notes (Signed)
Chief Complaint  Patient presents with   Procedure    Rm17, wife present, pt is well and ready for injection      ASSESSMENT AND PLAN  Terry Mayo is a 67 y.o. 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,  With residual spastic right upper extremity more than lower extremity weakness, painful right shoulder, elbow on passive stretch  EMG guided xeomin injection for spastic right upper extremity, asked for 400 units  Vascular risk factor of aging, hypertension, hyperlipidemia, history of smoker, coronary artery disease, on dual antiplatelet agent aspirin 81+ Plavix 75 on 27 November 2022,  Status post loop recorder  First electrical stimulation guided xeomin injection for spastic right upper extremity, use 400 units, (100 units was dissolved into 2 cc of normal saline)  Under electrical stimulation  Right pronator teres 50 units Right pectoralis major 50 units Right latissimus dorsi 50 units  Right brachialis 75 units Right flexor digitorum profundus  25 units Right flexor digitorum superficialis 25 units Right palmaris longus 25 units  DIAGNOSTIC DATA (LABS, IMAGING, TESTING) - I reviewed patient records, labs, notes, testing and imaging myself where available.   MEDICAL HISTORY:  Terry Mayo, is a 67 year old left-handed male, accompanied by his wife, seen in request by Terry Mayo, Terry Mayo, for evaluation of botulism toxin injection for spastic right upper extremity, initial evaluation was on June 15, 2022, his primary care physician is Dr. Marcene Brawn, MD (Inactive)   I reviewed and summarized the referring note. PMHX. HTN HLD History of smoke CAD Mayo/p Loop recorder   He suffered heart attack on December 04 2021, on December 09, 2021, developed acute onset of aphasia, right-sided weakness, was given TNK, mechanical thrombectomy for left ICA terminus occlusion, was able to achieve complete recanalization, status post loop recorder placement December 16, 2021  He is  now back home, ambulated without assistant, only mild language difficulty, most bothersome symptoms is his right arm, recent worsening swelling, painful upon passive stretch, mainly involving right shoulder and elbow, significant distal right upper extremity weakness  I reviewed his cardiologist Terry Mayo evaluation on March 11, 2022, non-ST MI on December 04, 2021, with multivessel CAD, ejection fraction 45 to 50%, status post loop recorder placement for atrial fibrillation surveillance,  Patient statin has to be stopped due to elevation of AST and ALT, now on Repatha   He is currently on dual antiplatelet agent aspirin 81 mg and Plavix 75 mg, the decision was to total duration for 1 year,  Update August 05, 2022: This is his first electrical stimulation guided xeomin injection for spastic right upper extremity, use 400 units, potential side effect explained, consent form was signed,  He has painful right shoulder, elbow with passive stretch, walking okay,  UPDATE November 26 2022 He responded well to previous injection, which has relaxed his right arm, better range of motion of right shoulder  PHYSICAL EXAM:   Vitals:   11/26/22 0936  BP: 114/68  Pulse: (!) 50   MOTOR: Right arm has antigravity movement of right shoulder abduction, right elbow flexion, extension, more profound distal right wrist hand weakness, no antigravity movement, mild right leg drift.  Painful right shoulder, elbow on passive stretch,  GAIT/STANCE: Push-up to get up from seated position, tendency to hold right arm in elbow flexion, dragging right leg mildly,  REVIEW OF SYSTEMS:  Full 14 system review of systems performed and notable only for as above All other review of systems were negative.  ALLERGIES: Allergies  Allergen Reactions   Penicillins     REACTION UNKNOWN Has patient had a PCN reaction causing immediate rash, facial/tongue/throat swelling, SOB or lightheadedness with hypotension:  UNKNOWN Has patient had a PCN reaction causing severe rash involving mucus membranes or skin necrosis: UNKNOWN Has patient had a PCN reaction that required hospitalization UNKNOWN Has patient had a PCN reaction occurring within the last 10 years: UNKNOWN If all of the above answers are "NO", then may proceed with Cephalosporin use.     HOME MEDICATIONS: Current Outpatient Medications  Medication Sig Dispense Refill   aspirin EC 81 MG tablet Take 1 tablet (81 mg total) by mouth daily. Swallow whole. 30 tablet 3   busPIRone (BUSPAR) 7.5 MG tablet Take 1 tablet (7.5 mg total) by mouth 2 (two) times daily. 60 tablet 0   clopidogrel (PLAVIX) 75 MG tablet Take 1 tablet (75 mg total) by mouth daily with breakfast. 30 tablet 0   Evolocumab (REPATHA SURECLICK) 140 MG/ML SOAJ Inject 140 mg into the skin every 14 days. 6 mL 3   ezetimibe (ZETIA) 10 MG tablet Take 1 tablet (10 mg total) by mouth daily. 90 tablet 3   hydrOXYzine (VISTARIL) 25 MG capsule Take 25 mg by mouth 2 (two) times daily as needed for anxiety.     incobotulinumtoxinA (XEOMIN) 100 units SOLR injection Inject 600 Units into the muscle every 3 (three) months. 6 each 0   isosorbide dinitrate (ISORDIL) 20 MG tablet Take 1 tablet (20 mg total) by mouth 2 (two) times daily. 60 tablet 0   losartan (COZAAR) 25 MG tablet Take 1 tablet (25 mg total) by mouth daily. 30 tablet 0   metoprolol tartrate (LOPRESSOR) 25 MG tablet Take 0.5 tablets (12.5 mg total) by mouth 2 (two) times daily. 60 tablet 0   mirtazapine (REMERON) 15 MG tablet Take 15 mg by mouth at bedtime.     nitroGLYCERIN (NITROSTAT) 0.4 MG SL tablet Place 1 tablet (0.4 mg total) under the tongue every 5 (five) minutes x 3 doses as needed for chest pain. 20 tablet 0   omeprazole (PRILOSEC OTC) 20 MG tablet Take 20 mg by mouth as needed (acid reflux).     pantoprazole sodium (PROTONIX) 40 mg Take 40 mg by mouth daily. (Patient taking differently: Take 40 mg by mouth as needed.) 30 each  0   predniSONE (DELTASONE) 20 MG tablet Take by mouth.     spironolactone (ALDACTONE) 25 MG tablet Take 0.5 tablets (12.5 mg total) by mouth daily. 15 tablet 0   traZODone (DESYREL) 50 MG tablet 1 tablet at bedtime as needed Orally Once a day for 30 day(Mayo)     Current Facility-Administered Medications  Medication Dose Route Frequency Provider Last Rate Last Admin   incobotulinumtoxinA (XEOMIN) 100 units injection 400 Units  400 Units Intramuscular Q90 days Levert Feinstein, MD   400 Units at 08/05/22 1416   incobotulinumtoxinA (XEOMIN) 100 units injection 400 Units  400 Units Intramuscular Q90 days Levert Feinstein, MD       incobotulinumtoxinA (XEOMIN) 100 units injection 400 Units  400 Units Intramuscular Once Levert Feinstein, MD        PAST MEDICAL HISTORY: Past Medical History:  Diagnosis Date   Arthritis    Essential hypertension    Hyperlipidemia    Kidney stones    MI (myocardial infarction) (HCC)    Stroke [facility])     PAST SURGICAL HISTORY: Past Surgical History:  Procedure Laterality Date   CHOLECYSTECTOMY  1990s  COLONOSCOPY WITH PROPOFOL N/A 09/30/2015   Procedure: COLONOSCOPY WITH PROPOFOL;  Surgeon: Corbin Ade, MD;  [facility];  Service: Endoscopy;  Laterality: N/A;  930   IR CT HEAD LTD  12/10/2021   IR PERCUTANEOUS ART THROMBECTOMY/INFUSION INTRACRANIAL INC DIAG ANGIO  12/10/2021   IR US GUIDE VASC ACCESS RIGHT  12/10/2021   KIDNEY STONE SURGERY  2005   LEFT HEART CATH AND CORONARY ANGIOGRAPHY N/A 12/04/2021   Procedure: LEFT HEART CATH AND CORONARY ANGIOGRAPHY;  Surgeon: Lyn Records, MD;  [facility] IN[facility];  Service: Cardiovascular;  Laterality: N/A;   LOOP RECORDER INSERTION N/A 12/16/2021   Procedure: LOOP RECORDER INSERTION;  Surgeon: Regan Lemming, MD;  [facility]: [facility];  Service: Cardiovascular;  Laterality: N/A;   RADIOLOGY WITH ANESTHESIA N/A 12/10/2021   Procedure: IR WITH ANESTHESIA;  Surgeon: Julieanne Cotton, MD;  Location: [facility]  OR;  Service: Radiology;  Laterality: N/A;    FAMILY HISTORY: Family History  Problem Relation Age of Onset   Coronary artery disease Sister    Colon cancer Neg Hx     SOCIAL HISTORY: Social History   Socioeconomic History   Marital status: Single    Spouse name: Not on file   Number of children: Not on file   Years of education: Not on file   Highest education level: Not on file  Occupational History   Not on file  Tobacco Use   Smoking status: Former    Current packs/day: 0.00    Types: Cigarettes    Quit date: 08/05/2015    Years since quitting: 7.3    Passive exposure: Past   Smokeless tobacco: Never  Vaping Use   Vaping status: Never Used  Substance and Sexual Activity   Alcohol use: Not Currently    Alcohol/week: 0.0 standard drinks of alcohol    Comment: No ETOH in a month. Variable, up to daily, as much as much as a fifth of liquor per sitting.   Drug use: No    Frequency: 7.0 times per week    Comment: As often as daily when he does smoke marijuana   Sexual activity: Yes    Birth control/protection: None  Other Topics Concern   Not on file  Social History Narrative   Not on file   Social Determinants of Health   Financial Resource Strain: Not on file  Food Insecurity: Not on file  Transportation Needs: Not on file  Physical Activity: Not on file  Stress: Not on file  Social Connections: Not on file  Intimate Partner Violence: Not on file      Levert Feinstein, M.D. Ph.D.  [facility] [address], [address] [place], [place] [address] Ph: [phone] Fax: [phone]  CC:  Marcene Brawn, MD [address] [place],  [place] [address]  Marcene Brawn, MD (Inactive)

## 2022-11-26 NOTE — Progress Notes (Signed)
xeomin 100units x 4 vial  [id number] Exp-06.2026 B/b Bacteriostatic 0.9% Sodium Chloride- 8mL  [id number] Expiration: 11.01.2025 NDC: [id number] Dx: G81.91 WITNESSED YS:[id number], CMA

## 2022-11-30 ENCOUNTER — Ambulatory Visit (HOSPITAL_COMMUNITY): Payer: Medicare HMO | Attending: Emergency Medicine

## 2022-11-30 DIAGNOSIS — I63232 Cerebral infarction due to unspecified occlusion or stenosis of left carotid arteries: Secondary | ICD-10-CM | POA: Insufficient documentation

## 2022-12-08 ENCOUNTER — Ambulatory Visit: Payer: Medicare HMO | Admitting: Internal Medicine

## 2022-12-14 NOTE — Progress Notes (Signed)
[facility] Summary Report / Loop Recorder 

## 2022-12-29 ENCOUNTER — Ambulatory Visit: Payer: Medicare HMO | Attending: Internal Medicine | Admitting: Internal Medicine

## 2022-12-29 ENCOUNTER — Encounter: Payer: Self-pay | Admitting: Internal Medicine

## 2022-12-29 VITALS — BP 100/60 | HR 56 | Ht 74.0 in | Wt 196.0 lb

## 2022-12-29 DIAGNOSIS — I25118 Atherosclerotic heart disease of native coronary artery with other forms of angina pectoris: Secondary | ICD-10-CM

## 2022-12-29 DIAGNOSIS — Z8673 Personal history of transient ischemic attack (TIA), and cerebral infarction without residual deficits: Secondary | ICD-10-CM | POA: Diagnosis not present

## 2022-12-29 NOTE — Progress Notes (Signed)
Cardiology Office Note  Date: 12/29/2022   ID: IVOR BLOYD, DOB Jan 24, 1956, MRN [id number]  PCP:  Marcene Brawn, MD (Inactive)  Cardiologist:  Marjo Bicker, MD Electrophysiologist:  None    History of Present Illness: Terry Mayo is a 67 y.o. male known to have CAD manifested by NSTEMI on 12/04/21 s/p multivessel CAD (pLAD total occlusion, dLCx 60% and mRCA 70%) with LVEF 45-50%, L MCA CVA s/p tNK and thrombectomy with no hemorrhagic transformation s/p Loop recorder placement for Afib surveillance, HTN, HLD presented to the cardiology clinic for follow-up visit.  Patient accompanied by wife.   LDL 146 in 03/2022 after discontinuing statins due to liver injury.  He was started on PCSK9 inhibitors since then but no repeat lipid panel was obtained.  He reported that he has an appointment with his PCP in the next 1 month and will get his blood work done.  No angina, DOE, walks 30 minutes a day with no symptoms of chest pain.  No dizziness, presyncope and syncope.  He is seeing neurology for spasticity in the right upper extremity from stroke.  Past Medical History:  Diagnosis Date   Arthritis    Essential hypertension    Hyperlipidemia    Kidney stones    MI (myocardial infarction) (HCC)    Stroke [facility])     Past Surgical History:  Procedure Laterality Date   CHOLECYSTECTOMY  1990s   COLONOSCOPY WITH PROPOFOL N/A 09/30/2015   Procedure: COLONOSCOPY WITH PROPOFOL;  Surgeon: Corbin Ade, MD;  [facility];  Service: Endoscopy;  Laterality: N/A;  930   IR CT HEAD LTD  12/10/2021   IR PERCUTANEOUS ART THROMBECTOMY/INFUSION INTRACRANIAL INC DIAG ANGIO  12/10/2021   IR US GUIDE VASC ACCESS RIGHT  12/10/2021   KIDNEY STONE SURGERY  2005   LEFT HEART CATH AND CORONARY ANGIOGRAPHY N/A 12/04/2021   Procedure: LEFT HEART CATH AND CORONARY ANGIOGRAPHY;  Surgeon: Lyn Records, MD;  [facility];  Service: Cardiovascular;  Laterality: N/A;   LOOP RECORDER  INSERTION N/A 12/16/2021   Procedure: LOOP RECORDER INSERTION;  Surgeon: Regan Lemming, MD;  [facility];  Service: Cardiovascular;  Laterality: N/A;   RADIOLOGY WITH ANESTHESIA N/A 12/10/2021   Procedure: IR WITH ANESTHESIA;  Surgeon: Julieanne Cotton, MD;  Location: [facility] OR;  Service: Radiology;  Laterality: N/A;    Current Outpatient Medications  Medication Sig Dispense Refill   aspirin EC 81 MG tablet Take 1 tablet (81 mg total) by mouth daily. Swallow whole. 30 tablet 3   busPIRone (BUSPAR) 7.5 MG tablet Take 1 tablet (7.5 mg total) by mouth 2 (two) times daily. 60 tablet 0   clopidogrel (PLAVIX) 75 MG tablet Take 1 tablet (75 mg total) by mouth daily with breakfast. 30 tablet 0   Evolocumab (REPATHA SURECLICK) 140 MG/ML SOAJ Inject 140 mg into the skin every 14 days. 6 mL 3   ezetimibe (ZETIA) 10 MG tablet Take 1 tablet (10 mg total) by mouth daily. 90 tablet 3   hydrOXYzine (VISTARIL) 25 MG capsule Take 25 mg by mouth 2 (two) times daily as needed for anxiety.     incobotulinumtoxinA (XEOMIN) 100 units SOLR injection Inject 600 Units into the muscle every 3 (three) months. 6 each 0   isosorbide dinitrate (ISORDIL) 20 MG tablet Take 1 tablet (20 mg total) by mouth 2 (two) times daily. 60 tablet 0   losartan (COZAAR) 25 MG tablet Take 1 tablet (  25 mg total) by mouth daily. 30 tablet 0   metoprolol tartrate (LOPRESSOR) 25 MG tablet Take 0.5 tablets (12.5 mg total) by mouth 2 (two) times daily. 60 tablet 0   mirtazapine (REMERON) 15 MG tablet Take 15 mg by mouth at bedtime.     nitroGLYCERIN (NITROSTAT) 0.4 MG SL tablet Place 1 tablet (0.4 mg total) under the tongue every 5 (five) minutes x 3 doses as needed for chest pain. 20 tablet 0   omeprazole (PRILOSEC OTC) 20 MG tablet Take 20 mg by mouth as needed (acid reflux).     pantoprazole sodium (PROTONIX) 40 mg Take 40 mg by mouth daily. (Patient taking differently: Take 40 mg by mouth as needed.) 30 each 0   predniSONE  (DELTASONE) 20 MG tablet Take by mouth.     spironolactone (ALDACTONE) 25 MG tablet Take 0.5 tablets (12.5 mg total) by mouth daily. 15 tablet 0   traZODone (DESYREL) 50 MG tablet 1 tablet at bedtime as needed Orally Once a day for 30 day(s)     Current Facility-Administered Medications  Medication Dose Route Frequency Provider Last Rate Last Admin   incobotulinumtoxinA (XEOMIN) 100 units injection 400 Units  400 Units Intramuscular Q90 days Terry Feinstein, MD   400 Units at 08/05/22 1416   incobotulinumtoxinA (XEOMIN) 100 units injection 400 Units  400 Units Intramuscular Q90 days Terry Feinstein, MD       Allergies:  Penicillins   Social History: The patient  reports that he quit smoking about 7 years ago. His smoking use included cigarettes. He has been exposed to tobacco smoke. He has never used smokeless tobacco. He reports that he does not currently use alcohol. He reports that he does not use drugs.   Family History: The patient's family history includes Coronary artery disease in his sister.   ROS:  Please see the history of present illness. Otherwise, complete review of systems is positive for none.  All other systems are reviewed and negative.   Physical Exam: VS:  Ht 6\' 2"  (1.88 m)   Wt 196 lb (88.9 kg)   BMI 25.16 kg/m , BMI Body mass index is 25.16 kg/m.  Wt Readings from Last 3 Encounters:  12/29/22 196 lb (88.9 kg)  06/15/22 188 lb (85.3 kg)  05/20/22 184 lb 9.6 oz (83.7 kg)    General: Patient appears comfortable at rest. HEENT: Conjunctiva and lids normal, oropharynx clear with moist mucosa. Neck: Supple, no elevated JVP or carotid bruits, no thyromegaly. Lungs: Clear to auscultation, nonlabored breathing at rest. Cardiac: Regular rate and rhythm, no S3 or significant systolic murmur, no pericardial rub. Abdomen: Soft, nontender, no hepatomegaly, bowel sounds present, no guarding or rebound. Extremities: No pitting edema, distal pulses 2+. Skin: Warm and  dry. Musculoskeletal: No kyphosis. Neuropsychiatric: Alert and oriented x3, RUE and RLE weakness and decreased sensation  ECG:  An ECG dated recently in 09/23 was personally reviewed today and demonstrated:  NSR and no ST-T changes  Recent Labwork: 02/10/2022: ALT 193; AST 109 04/04/2022: BUN 17; Creatinine, Ser 1.37; Hemoglobin 13.9; Platelets 207; Potassium 3.9; Sodium 139     Component Value Date/Time   CHOL 99 12/05/2021 0317   TRIG 58 12/11/2021 0612   HDL 50 12/05/2021 0317   CHOLHDL 2.0 12/05/2021 0317   VLDL 8 12/05/2021 0317   LDLCALC 41 12/05/2021 0317    Other Studies Reviewed Today: Echo on 12/05/21 False tendon in the apical LV of no clinical significance. Left ventricular ejection fraction, by estimation,  is 45 to 50%. The left ventricle has mildly decreased function. The left ventricle demonstrates regional wall motion abnormalities (see scoring diagram/findings for description). Left ventricular diastolic parameters are indeterminate. There is akinesis of the left ventricular, apical septal wall and anterior wall. There is hypokinesis of the left ventricular, mid anterior wall. There is akinesis of the left ventricular, apical segment. There is akinesis of the left ventricular, mid anteroseptal wall. Right ventricular systolic function is normal. The right ventricular size is normal. A Prominent moderator band is visualized. There is normal pulmonary artery systolic pressure. The estimated right ventricular systolic pressure is 33.4 mmHg. The mitral valve is normal in structure. Trivial mitral valve regurgitation. No evidence of mitral stenosis. The aortic valve is normal in structure. Aortic valve regurgitation is not visualized. No aortic stenosis is present. The inferior vena cava is dilated in size with >50% respiratory variability, suggesting right atrial pressure of 8 mmHg.  LHC on 12/04/21 CONCLUSIONS: The left coronary anatomy is ambiguous because of  multiple diagonals as ramus light branches.  But we can never find the stump. There are faint hint of thrombus distal or around the origin of the large septal perforator. 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 Left main is widely patent Circumflex gives origin to 2 large obtuse marginal branches that arise from a common stent.  The more medial branch is larger and has moderate proximal disease up to 60%. Right coronary contains codominant anatomy and moderate mid vessel disease up to 60 to 70% in the mid vessel.  It supplies collaterals around the apex to the LAD. Left ventriculography demonstrates inferoapical and apical moderate hypokinesis.  EF 40 to 50%.  LVEDP upper normal at 17 mmHg.   Recommend: Aspirin and Plavix for at least 6 months. Aggressive lipid-lowering. Nitroglycerin if recurrent chest pain.  Assessment and Plan: Patient is a 67 year old male known to have CAD manifested by NSTEMI on 12/04/21 s/p multivessel CAD (pLAD total occlusion, dLCx 60% and mRCA 70%) with LVEF 45-50%, L MCA CVA s/p tNK and thrombectomy with no hemorrhagic transformation s/p Loop recorder placement for Afib surveillance, HTN, HLD presented to the cardiology clinic for post hospitalization follow-up.  #CAD manifested by NSTEMI on 12/04/21 s/p multivessel CAD (pLAD total occlusion with R to L collaterals, dLCx 60% and mRCA 70%) with LVEF 45-50%, currently angina free -Continue DAPT indefinitely, aspirin 81 mg and Plavix 75 mg once daily (recommended for 6 months but due to multivessel CAD, will continue indefinitely) -Continue Repatha every 2 weeks -ER precautions for chest pain  #HLD, unknown values -Continue Repatha every 2 weeks, goal LDL less than 55.  He will get lipid panel checked by his PCP in 1 month.  #Ischemic cardiomyopathy, LVEF 45-50%, currently compensated -Continue GDMT with metoprolol titrate 12.5 mg twice daily, losartan 25 mg once daily,  spironolactone 12.5 mg once daily and Isordil 20 mg twice daily.  Obtain echocardiogram to evaluate for improvement in LVEF.  # L MCA CVA s/p tNK and thrombectomy with no hemorrhagic transformation s/p Loop recorder placement for Afib surveillance -No atrial arrhythmias on loop interrogation  #HTN, currently controlled -Continue medications as stated above    Disposition:  Follow up  1 year  Signed, Terry Mort Verne Spurr, MD, 12/29/2022 8:46 AM    [facility]

## 2022-12-29 NOTE — Patient Instructions (Addendum)
Medication Instructions:  Your physician recommends that you continue on your current medications as directed. Please refer to the Current Medication list given to you today.  *If you need a refill on your cardiac medications before your next appointment, please call your pharmacy*   Lab Work: None If you have labs (blood work) drawn today and your tests are completely normal, you will receive your results only by: MyChart Message (if you have MyChart) OR A paper copy in the mail If you have any lab test that is abnormal or we need to change your treatment, we will call you to review the results.   Testing/Procedures: Your physician has requested that you have an echocardiogram. Echocardiography is a painless test that uses sound waves to create images of your heart. It provides your doctor with information about the size and shape of your heart and how well your heart's chambers and valves are working. This procedure takes approximately one hour. There are no restrictions for this procedure. Please do NOT wear cologne, perfume, aftershave, or lotions (deodorant is allowed). Please arrive 15 minutes prior to your appointment time.    Follow-Up: At [facility], you and your health needs are our priority.  As part of our continuing mission to provide you with exceptional heart care, we have created designated Provider Care Teams.  These Care Teams include your primary Cardiologist (physician) and Advanced Practice Providers (APPs -  Physician Assistants and Nurse Practitioners) who all work together to provide you with the care you need, when you need it.  We recommend signing up for the patient portal called "MyChart".  Sign up information is provided on this After Visit Summary.  MyChart is used to connect with patients for Virtual Visits (Telemedicine).  Patients are able to view lab/test results, encounter notes, upcoming appointments, etc.  Non-urgent messages can be sent to your  provider as well.   To learn more about what you can do with MyChart, go to [email or web address].    Your next appointment:   1 year(s)  Provider:   You may see Vishnu P Mallipeddi, MD or one of the following Advanced Practice Providers on your designated Care Team:   [place], PA-C  Jacolyn Reedy, [place]      Other Instructions

## 2023-01-04 ENCOUNTER — Ambulatory Visit (HOSPITAL_COMMUNITY): Payer: Medicare HMO | Attending: Emergency Medicine

## 2023-01-04 DIAGNOSIS — Z8673 Personal history of transient ischemic attack (TIA), and cerebral infarction without residual deficits: Secondary | ICD-10-CM | POA: Diagnosis not present

## 2023-01-04 DIAGNOSIS — Z4509 Encounter for adjustment and management of other cardiac device: Secondary | ICD-10-CM | POA: Diagnosis not present

## 2023-01-04 LAB — CUP PACEART REMOTE DEVICE CHECK
Date Time Interrogation Session: 20240906230523
Implantable Pulse Generator Implant Date: 20230822

## 2023-01-21 NOTE — Progress Notes (Signed)
[facility] Summary Report / Loop Recorder 

## 2023-01-22 ENCOUNTER — Ambulatory Visit (HOSPITAL_COMMUNITY)
Admission: RE | Admit: 2023-01-22 | Discharge: 2023-01-22 | Disposition: A | Discharge status: Home / Self Care | Payer: Medicare HMO | Source: Ambulatory Visit | Attending: Internal Medicine | Admitting: Internal Medicine

## 2023-01-22 DIAGNOSIS — I25118 Atherosclerotic heart disease of native coronary artery with other forms of angina pectoris: Secondary | ICD-10-CM | POA: Diagnosis present

## 2023-01-22 LAB — ECHOCARDIOGRAM COMPLETE
AR max vel: 2.36 cm2
AV Area VTI: 2.39 cm2
AV Area mean vel: 2.18 cm2
AV Mean grad: 2 mm[Hg]
AV Peak grad: 3.1 mm[Hg]
Ao pk vel: 0.89 m/s
Area-P 1/2: 3.37 cm2
Est EF: 50
S' Lateral: 3.9 cm

## 2023-01-22 NOTE — Progress Notes (Signed)
*  PRELIMINARY RESULTS* Echocardiogram 2D Echocardiogram has been performed.  Laddie Aquas 01/22/2023, 11:33 AM

## 2023-01-27 ENCOUNTER — Ambulatory Visit (INDEPENDENT_AMBULATORY_CARE_PROVIDER_SITE_OTHER): Payer: Medicare HMO | Admitting: Neurology

## 2023-01-27 VITALS — BP 107/65 | HR 50 | Ht 74.0 in | Wt 196.4 lb

## 2023-01-27 DIAGNOSIS — Z8673 Personal history of transient ischemic attack (TIA), and cerebral infarction without residual deficits: Secondary | ICD-10-CM | POA: Diagnosis not present

## 2023-01-27 DIAGNOSIS — G8110 Spastic hemiplegia affecting unspecified side: Secondary | ICD-10-CM

## 2023-01-27 NOTE — Patient Instructions (Signed)
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  I encouraged him to continue ongoing Xeomin injections with Dr. Terrace Arabia  to improve right upper extremity injury of motion.  Refer to Dr. Wynn Banker for consideration for VIvistim program later since now he has completed his therapy sessions.  Continue aspirin and Plavix for stroke prevention given history of cardiac stents and maintain aggressive risk factor modification with strict control of hypertension with blood pressure goal below 130/90, lipids with LDL cholesterol goal below 70 mg for follow-up in the future with me months or call earlier if needed.  Check screening follow-up carotid ultrasound and transcranial Doppler studies.  Return for follow-up with me in the future in 1 year or call earlier if necessary.

## 2023-01-27 NOTE — Progress Notes (Signed)
Patient: Terry Mayo Date of Birth: 18-Dec-1955  Reason for Visit: Follow up left MCA stroke History from: Patient, wife Primary Neurologist: Saw Terry Mayo and Terry Mayo     HISTORY OF PRESENT ILLNESS: Update 01/27/23 : He returns for follow-up after last visit with me he did not have months ago.  He is accompanied by his wife.  Patient has not finished physical occupational therapy.  He states he is doing quite well and can walk independently without any assistance.  He still has slight stiffness and dragging of the right leg.  He has noticed improvement in his right hand grip strength, finger movements and range of motion since starting Xeomin injections by Terry Mayo and he gets them every 3 months.  He is tolerating aspirin and Plavix well without bruising or bleeding.  He still has some mild residual speech difficulties and hand weakness.  He is independent in activities of daily living.  Lab work on 08/13/2022 showed hemoglobin A1c to be 5.5.  Loop recorder interrogation on 01/01/2023 showed no evidence of A-fib yet.  Echocardiogram on 01/22/2023 showed ejection fraction of 50%.  He has no new complaints today.   Update 05/20/2022:  he returns for follow-up after last visit 4 months ago.  He is accompanied by his wife.  He has noted improvement in his speech as well as swallowing but right upper extremity weakness and pain and spasticity persist.  He still has practically not much useful function in the right hand and can basilarly fingers of the hold anything in that hand and is  still currently undergoing occupational and physical therapy 1 to twice a week.He is tolerating aspirin and Plavix well without bruising or bleeding.  Blood pressure is under good control today it is 109/66.  He had lipid profile checked on 04/13/2022 which is not satisfactory with LDL greater than 70 Zetia was added Repatha injections.  His first injection was earlier this month.  He has not had any recurrent stroke or TIA  symptoms.  Last visit 01/15/2022 Terry Ege, NP )Terry Mayo is here today for stroke clinic follow-up.  He had an NSTEMI with cath 12/04/2021 no stents were placed, had been doing well. 12/09/21 developed acute onset right-sided weakness and dysarthria went to the ED.  He was given TNK, post IR with TICI3 reperfusion with Terry Mayo for left terminus occlusion.  Loop recorder was placed 12/16/21.  He was discharged to the [facility].  12/24/21 went to the ER for trouble swallowing diagnosed with esophagitis.  Worked with therapy PT/OT/ST on a pured diet with nectar thick liquids.  He was able to ambulate independently.  He was discharged home 01/01/22.   Is now home with his wife, is doing home health PT/ST/OT. Is doing better with swallowing, upgraded to more solids, food comes out of the right mouth, drools. Doesn't have much appetite. Was picky eater before. Walking with a cane now. Limited movement of right arm, not much use of hand. Speech is still slurred, is getting better. Was not established with cardiology, sees Terry Mayo next month. Is retired. Stopped smoking THC in August.   -CT head was negative -CTA head and neck distal left ICA and left MCA occlusion -Status post IR with TICI3 reperfusion -MRI of the brain showed large left MCA infarct -2D echo 45 to 50% -EP placed a loop recorder prior to discharge -LDL 41 -A1c 5.5 -Aspirin 81 mg daily and Plavix 75 mg daily prior  to admission, continue DAPT at discharge, duration per cardiology  HISTORY  Copied 12/10/21 Terry Mayo HPI: Terry Mayo is a 67 y.o. male with a past medical history significant for hypertension, hyperlipidemia, former smoking, BMI 25.67, recent NSTEMI s/p cath 12/04/2021 (medical management).   He was doing well after discharge until he developed acute onset right-sided weakness and dysarthria for which he presented to the ED for evaluation.   He was initially evaluated by telespecialists who administered  TNK and then discussed potential thrombectomy with myself.  I obtained consent from the patient's wife via telephone for the procedure reviewing risks and benefits and answering her questions.   She confirms that he had been doing well postdischarge, but had been having headaches for the past couple of weeks (his typical migraines, but these had been resolved for the last few years).  She had stepped away to use the bathroom, and found him 5 to 10 minutes later with severely slurred speech and right-sided weakness as well as some confusion.  She notes his weight is very variable but denies any other health concerns   LKW: 2150 Per telemetry specialists evaluation TNK given?:  2309 IA performed?:  Yes Premorbid modified rankin scale:      0 - No symptoms  REVIEW OF SYSTEMS: Out of a complete 14 system review of symptoms, the patient complains only of the following symptoms, and all other reviewed systems are negative.  See HPI  ALLERGIES: Allergies  Allergen Reactions   Penicillins     REACTION UNKNOWN Has patient had a PCN reaction causing immediate rash, facial/tongue/throat swelling, SOB or lightheadedness with hypotension: UNKNOWN Has patient had a PCN reaction causing severe rash involving mucus membranes or skin necrosis: UNKNOWN Has patient had a PCN reaction that required hospitalization UNKNOWN Has patient had a PCN reaction occurring within the last 10 years: UNKNOWN If all of the above answers are "NO", then may proceed with Cephalosporin use.     HOME MEDICATIONS: Outpatient Medications Prior to Visit  Medication Sig Dispense Refill   aspirin EC 81 MG tablet Take 1 tablet (81 mg total) by mouth daily. Swallow whole. 30 tablet 3   busPIRone (BUSPAR) 7.5 MG tablet Take 1 tablet (7.5 mg total) by mouth 2 (two) times daily. 60 tablet 0   clopidogrel (PLAVIX) 75 MG tablet Take 1 tablet (75 mg total) by mouth daily with breakfast. 30 tablet 0   Evolocumab (REPATHA SURECLICK) 140  MG/ML SOAJ Inject 140 mg into the skin every 14 days. 6 mL 3   ezetimibe (ZETIA) 10 MG tablet Take 1 tablet (10 mg total) by mouth daily. 90 tablet 3   hydrOXYzine (VISTARIL) 25 MG capsule Take 25 mg by mouth 2 (two) times daily as needed for anxiety.     incobotulinumtoxinA (XEOMIN) 100 units SOLR injection Inject 600 Units into the muscle every 3 (three) months. 6 each 0   isosorbide dinitrate (ISORDIL) 20 MG tablet Take 1 tablet (20 mg total) by mouth 2 (two) times daily. 60 tablet 0   losartan (COZAAR) 25 MG tablet Take 1 tablet (25 mg total) by mouth daily. 30 tablet 0   metoprolol tartrate (LOPRESSOR) 25 MG tablet Take 0.5 tablets (12.5 mg total) by mouth 2 (two) times daily. 60 tablet 0   mirtazapine (REMERON) 15 MG tablet Take 15 mg by mouth at bedtime.     nitroGLYCERIN (NITROSTAT) 0.4 MG SL tablet Place 1 tablet (0.4 mg total) under the tongue every 5 (five) minutes x  3 doses as needed for chest pain. 20 tablet 0   omeprazole (PRILOSEC OTC) 20 MG tablet Take 20 mg by mouth as needed (acid reflux).     pantoprazole sodium (PROTONIX) 40 mg Take 40 mg by mouth daily. (Patient taking differently: Take 40 mg by mouth as needed.) 30 each 0   predniSONE (DELTASONE) 20 MG tablet Take by mouth.     spironolactone (ALDACTONE) 25 MG tablet Take 0.5 tablets (12.5 mg total) by mouth daily. 15 tablet 0   traZODone (DESYREL) 50 MG tablet 1 tablet at bedtime as needed Orally Once a day for 30 day(s)     Facility-Administered Medications Prior to Visit  Medication Dose Route Frequency Provider Last Rate Last Admin   incobotulinumtoxinA (XEOMIN) 100 units injection 400 Units  400 Units Intramuscular Q90 days Levert Feinstein, MD   400 Units at 08/05/22 1416   incobotulinumtoxinA (XEOMIN) 100 units injection 400 Units  400 Units Intramuscular Q90 days Levert Feinstein, MD        PAST MEDICAL HISTORY: Past Medical History:  Diagnosis Date   Arthritis    Essential hypertension    Hyperlipidemia    Kidney stones     MI (myocardial infarction) (HCC)    Stroke [facility])     PAST SURGICAL HISTORY: Past Surgical History:  Procedure Laterality Date   CHOLECYSTECTOMY  1990s   COLONOSCOPY WITH PROPOFOL N/A 09/30/2015   Procedure: COLONOSCOPY WITH PROPOFOL;  Surgeon: Corbin Ade, MD;  [facility];  Service: Endoscopy;  Laterality: N/A;  930   IR CT HEAD LTD  12/10/2021   IR PERCUTANEOUS ART THROMBECTOMY/INFUSION INTRACRANIAL INC DIAG ANGIO  12/10/2021   IR US GUIDE VASC ACCESS RIGHT  12/10/2021   KIDNEY STONE SURGERY  2005   LEFT HEART CATH AND CORONARY ANGIOGRAPHY N/A 12/04/2021   Procedure: LEFT HEART CATH AND CORONARY ANGIOGRAPHY;  Surgeon: Lyn Records, MD;  [facility];  Service: Cardiovascular;  Laterality: N/A;   LOOP RECORDER INSERTION N/A 12/16/2021   Procedure: LOOP RECORDER INSERTION;  Surgeon: Regan Lemming, MD;  Location: [facility];  Service: Cardiovascular;  Laterality: N/A;   RADIOLOGY WITH ANESTHESIA N/A 12/10/2021   Procedure: IR WITH ANESTHESIA;  Surgeon: Julieanne Cotton, MD;  Location: [facility] OR;  Service: Radiology;  Laterality: N/A;    FAMILY HISTORY: Family History  Problem Relation Age of Onset   Coronary artery disease Sister    Colon cancer Neg Hx     SOCIAL HISTORY: Social History   Socioeconomic History   Marital status: Single    Spouse name: Not on file   Number of children: Not on file   Years of education: Not on file   Highest education level: Not on file  Occupational History   Not on file  Tobacco Use   Smoking status: Former    Current packs/day: 0.00    Types: Cigarettes    Quit date: 08/05/2015    Years since quitting: 7.4    Passive exposure: Past   Smokeless tobacco: Never  Vaping Use   Vaping status: Never Used  Substance and Sexual Activity   Alcohol use: Not Currently    Alcohol/week: 0.0 standard drinks of alcohol    Comment: No ETOH in a month. Variable, up to daily, as much as much as a fifth of  liquor per sitting.   Drug use: No    Frequency: 7.0 times per week    Comment: As often as daily when  he does smoke marijuana   Sexual activity: Yes    Birth control/protection: None  Other Topics Concern   Not on file  Social History Narrative   Not on file   Social Determinants of Health   Financial Resource Strain: Not on file  Food Insecurity: Not on file  Transportation Needs: Not on file  Physical Activity: Not on file  Stress: Not on file  Social Connections: Not on file  Intimate Partner Violence: Not on file   PHYSICAL EXAM  Vitals:   01/27/23 1341  BP: 107/65  Pulse: (!) 50  Weight: 196 lb 6.4 oz (89.1 kg)  Height: 6\' 2"  (1.88 m)   Body mass index is 25.22 kg/m.  Generalized: Well developed, in no acute distress  Neurological examination  Mentation: Alert oriented to time, place, history taking. Follows all commands, speech is moderately dysarthric, very pleasant Cranial nerve II-XII: Pupils were equal round reactive to light.  Left gaze preference, does not completely track to the right.  Right-sided facial droop.  Decreased right-sided shoulder shrug. Motor: right arm 2/5, trace movement of the right wrist or hand cannot squeeze, left arm is normal 4/5 right leg, left leg is normal.  Increased tone in the right upper greater than lower extremity with pain on extension of the right elbow. Sensory: Sensory testing is intact to soft touch on all 4 extremities. No evidence of extinction is noted.  Coordination: Cannot perform finger-nose-finger with the right, heel to shin both sides is normal Gait and station: Cautious, wide-based, tends to drag the right leg., uses cane Reflexes: Deep tendon reflexes are symmetric but increased on the right slightly  DIAGNOSTIC DATA (LABS, IMAGING, TESTING) - I reviewed patient records, labs, notes, testing and imaging myself where available.  Lab Results  Component Value Date   WBC 8.0 04/04/2022   HGB 13.9 04/04/2022    HCT 40.7 04/04/2022   MCV 92.7 04/04/2022   PLT 207 04/04/2022      Component Value Date/Time   NA 139 04/04/2022 1328   K 3.9 04/04/2022 1328   CL 105 04/04/2022 1328   CO2 27 04/04/2022 1328   GLUCOSE 92 04/04/2022 1328   BUN 17 04/04/2022 1328   CREATININE 1.37 (H) 04/04/2022 1328   CALCIUM 9.1 04/04/2022 1328   PROT 7.4 02/10/2022 1039   ALBUMIN 4.1 02/10/2022 1039   AST 109 (H) 02/10/2022 1039   ALT 193 (H) 02/10/2022 1039   ALKPHOS 86 02/10/2022 1039   BILITOT 0.8 02/10/2022 1039   GFRNONAA 57 (L) 04/04/2022 1328   GFRAA >60 09/26/2015 0800   Lab Results  Component Value Date   CHOL 99 12/05/2021   HDL 50 12/05/2021   LDLCALC 41 12/05/2021   TRIG 58 12/11/2021   CHOLHDL 2.0 12/05/2021   Lab Results  Component Value Date   HGBA1C 5.5 12/05/2021   No results found for: "VITAMINB12" No results found for: "TSH"  IMPRESSION : 67 year old African-American male with left MCA infarct in August 2023 due to terminal left ICA and MCA occlusion treated with thrombolysis with IV TNK followed by successful mechanical thrombectomy.  He is doing well but still has mild right spastic hemiparesis with significant right. upper extremity weakness.  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  I encouraged him to continue ongoing Xeomin injections with Terry Mayo  to improve right upper extremity injury of motion.  Refer to Dr. Wynn Banker for consideration for VIvistim program later since now he has completed his therapy sessions.  Continue aspirin and Plavix for stroke prevention given history of cardiac stents and maintain aggressive risk factor modification with strict control of hypertension with blood pressure goal below 130/90, lipids with LDL cholesterol goal below 70 mg for follow-up in the future with me  months or call earlier if needed.  Check screening follow-up carotid ultrasound and transcranial Doppler studies.  Return for follow-up with me in the future in 1 year or call earlier if necessary.  Greater than 50% time during this 35-minute visit was spent in counseling and coordination of care about his stroke and spastic right hemiparesis questionable evaluation and treatment and answering questions.  Delia Heady, MD 01/27/2023, 2:01 PM [facility] [address] [place], [place] [address] [phone]

## 2023-02-08 ENCOUNTER — Ambulatory Visit (HOSPITAL_COMMUNITY): Payer: Medicare HMO | Attending: Emergency Medicine

## 2023-02-08 DIAGNOSIS — Z8673 Personal history of transient ischemic attack (TIA), and cerebral infarction without residual deficits: Secondary | ICD-10-CM | POA: Insufficient documentation

## 2023-02-09 LAB — CUP PACEART REMOTE DEVICE CHECK
Date Time Interrogation Session: 20241013231015
Implantable Pulse Generator Implant Date: 20230822

## 2023-02-10 ENCOUNTER — Encounter: Payer: Self-pay | Admitting: Physical Medicine & Rehabilitation

## 2023-02-23 NOTE — Progress Notes (Signed)
[facility] Summary Report / Loop Recorder 

## 2023-02-25 ENCOUNTER — Encounter: Payer: Self-pay | Admitting: Physical Medicine & Rehabilitation

## 2023-02-25 ENCOUNTER — Encounter: Payer: Medicare HMO | Attending: Physical Medicine & Rehabilitation | Admitting: Physical Medicine & Rehabilitation

## 2023-02-25 VITALS — BP 111/61 | HR 53 | Ht 74.0 in | Wt 196.0 lb

## 2023-02-25 DIAGNOSIS — G8320 Monoplegia of upper limb affecting unspecified side: Secondary | ICD-10-CM | POA: Diagnosis present

## 2023-02-25 NOTE — Progress Notes (Signed)
I agree with the above plan 

## 2023-02-25 NOTE — Progress Notes (Signed)
Subjective:    Patient ID: Terry Mayo, male    DOB: 31-Jul-1955, 67 y.o.   MRN: [id number]  HPI 67 year old male referred by Dr. Pearlean Brownie to evaluate feasibility of vagal nerve stimulation. Left MCA infarct 12/11/21, was in acute care hospital in [facility] and then had [facility] therapy , as well as outpatient therapy finished in Feb 2024. The patient is modified independent with all self-care and mobility and is now driving.  Dr Terrace Arabia does Botox (Xeomin ) injections x 2 thus far targeting  Right pronator teres 50 units Right pectoralis major 50 units Right latissimus dorsi 50 units   Right brachialis 75 units Right flexor digitorum profundus  25 units Right flexor digitorum superficialis 25 units Right palmaris longus 25 units  Last Xeomin dose was 11/26/2022.  She is scheduled for another injection on 03/01/2023.  Patient's wife has noted that fingers are getting a little bit tighter.    Pain Inventory Average Pain 3 Pain Right Now 0 My pain is sharp  LOCATION OF PAIN  hand, shoulder  BOWEL Number of stools per week: 4 Oral laxative use Yes  Type of laxative Miralax, MOM   BLADDER Normal    Mobility how many minutes can you walk? 30 ability to climb steps?  yes do you drive?  yes Do you have any goals in this area?  yes  Function retired I need assistance with the following:  dressing, bathing, and meal prep  Neuro/Psych anxiety  Prior Studies Any changes since last visit?  no New pt  Physicians involved in your care New pt   Family History  Problem Relation Age of Onset   Coronary artery disease Sister    Colon cancer Neg Hx    Social History   Socioeconomic History   Marital status: Single    Spouse name: Not on file   Number of children: Not on file   Years of education: Not on file   Highest education level: Not on file  Occupational History   Not on file  Tobacco Use   Smoking status: Former    Current packs/day: 0.00    Types: Cigarettes    Quit  date: 08/05/2015    Years since quitting: 7.5    Passive exposure: Past   Smokeless tobacco: Never  Vaping Use   Vaping status: Never Used  Substance and Sexual Activity   Alcohol use: Not Currently    Alcohol/week: 0.0 standard drinks of alcohol    Comment: No ETOH in a month. Variable, up to daily, as much as much as a fifth of liquor per sitting.   Drug use: No    Frequency: 7.0 times per week    Comment: As often as daily when he does smoke marijuana   Sexual activity: Yes    Birth control/protection: None  Other Topics Concern   Not on file  Social History Narrative   Not on file   Social Determinants of Health   Financial Resource Strain: Not on file  Food Insecurity: Not on file  Transportation Needs: Not on file  Physical Activity: Not on file  Stress: Not on file  Social Connections: Not on file   Past Surgical History:  Procedure Laterality Date   CHOLECYSTECTOMY  1990s   COLONOSCOPY WITH PROPOFOL N/A 09/30/2015   Procedure: COLONOSCOPY WITH PROPOFOL;  Surgeon: Corbin Ade, MD;  [facility];  Service: Endoscopy;  Laterality: N/A;  930   IR CT HEAD LTD  12/10/2021  IR PERCUTANEOUS ART THROMBECTOMY/INFUSION INTRACRANIAL INC DIAG ANGIO  12/10/2021   IR US GUIDE VASC ACCESS RIGHT  12/10/2021   KIDNEY STONE SURGERY  2005   LEFT HEART CATH AND CORONARY ANGIOGRAPHY N/A 12/04/2021   Procedure: LEFT HEART CATH AND CORONARY ANGIOGRAPHY;  Surgeon: Lyn Records, MD;  [facility];  Service: Cardiovascular;  Laterality: N/A;   LOOP RECORDER INSERTION N/A 12/16/2021   Procedure: LOOP RECORDER INSERTION;  Surgeon: Regan Lemming, MD;  [facility];  Service: Cardiovascular;  Laterality: N/A;   RADIOLOGY WITH ANESTHESIA N/A 12/10/2021   Procedure: IR WITH ANESTHESIA;  Surgeon: Julieanne Cotton, MD;  Location: [facility] OR;  Service: Radiology;  Laterality: N/A;   Past Medical History:  Diagnosis Date   Arthritis    Essential  hypertension    Hyperlipidemia    Kidney stones    MI (myocardial infarction) (HCC)    Stroke (HCC)    BP 111/61   Pulse (!) 53   Ht 6\' 2"  (1.88 m)   Wt 196 lb (88.9 kg)   SpO2 97%   BMI 25.16 kg/m   Opioid Risk Score:   Fall Risk Score:  `1  Depression screen PHQ 2/9      No data to display          Review of Systems  Eyes:  Positive for visual disturbance.  Gastrointestinal:  Positive for constipation.  Musculoskeletal:        Shoulder and hand pain  Skin:  Positive for rash.  Psychiatric/Behavioral:         ANXIETY       Objective:   Physical Exam General No acute distress Mood and affect are appropriate Extremities without edema Motor strength is 3 - at the right deltoid bicep tricep finger flexors finger extensors Sensation is reduced to light touch in the right upper extremity hand.  The patient does have sensation at the thumb but does not feel the finger light touch on the fifth digit also reduced proprioception in the fingers. Speech mild dysarthria, aphasic with decreased length of utterance.  Is able to answer simple questions appropriately.  He follows commands without issue.  Ambulates without assistive device no evidence of toe drag or knee instability      Assessment & Plan:   #1.  Left MCA distribution infarct approximately 14 months ago overall has had a good recovery and is basically at a modified independent level.  He still has a mild to moderate expressive aphasia but intact comprehension. The patient has been using the right upper extremity to assist with ADLs as well as driving.  He does have partial movements of all muscle groups in the right upper extremity and uses it as a gross assist for ADLs. Sensation is moderately impaired right upper extremity this would be my only concern about vagal nerve stimulation. Certainly I think neck step would be to get OT evaluation with Fugl-Meyer Assessment.  The patient will think about this  he has several MD appointments coming up including repeat Xeomin injection.  I do think it is appropriate to do the Xeomin injection prior to any Occupational Therapy evaluation.

## 2023-02-25 NOTE — Patient Instructions (Addendum)
Please call if you would like an OT eval for Vivistim

## 2023-03-01 ENCOUNTER — Ambulatory Visit (INDEPENDENT_AMBULATORY_CARE_PROVIDER_SITE_OTHER): Payer: Medicare HMO | Admitting: Neurology

## 2023-03-01 VITALS — BP 114/73

## 2023-03-01 DIAGNOSIS — G8111 Spastic hemiplegia affecting right dominant side: Secondary | ICD-10-CM | POA: Diagnosis not present

## 2023-03-01 DIAGNOSIS — G8191 Hemiplegia, unspecified affecting right dominant side: Secondary | ICD-10-CM | POA: Diagnosis not present

## 2023-03-01 DIAGNOSIS — G8110 Spastic hemiplegia affecting unspecified side: Secondary | ICD-10-CM

## 2023-03-01 MED ORDER — INCOBOTULINUMTOXINA 100 UNITS IM SOLR
400.0000 [IU] | Freq: Once | INTRAMUSCULAR | Status: AC
Start: 2023-03-01 — End: 2023-03-01
  Administered 2023-03-01: 400 [IU] via INTRAMUSCULAR

## 2023-03-01 NOTE — Progress Notes (Signed)
xeomin 100units x 4 vial  [id number] [id number] Exp-12.2026 B/B Bacteriostatic 0.9% Sodium Chloride- 8mL  UYQ:[id number] Expiration: 04.01.2025 NDC: [id number] Dx: G81.91 WITNESSED PP:IRJJO, RN

## 2023-03-01 NOTE — Progress Notes (Signed)
Chief Complaint  Patient presents with   Injections    Rm14, wife present, Pt is well and ready for injection      ASSESSMENT AND PLAN  Terry Mayo is a 67 y.o. 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,  With residual spastic right upper extremity more than lower extremity weakness, painful right shoulder, elbow on passive stretch  EMG guided xeomin injection for spastic right upper extremity, asked for 400 units  Vascular risk factor of aging, hypertension, hyperlipidemia, history of smoker, coronary artery disease, on dual antiplatelet agent aspirin 81+ Plavix 75 on 27 November 2022,  Status post loop recorder  First electrical stimulation guided xeomin injection for spastic right upper extremity, use 400 units, (100 units was dissolved into 2 cc of normal saline)  Under electrical stimulation  Right pronator teres 50 units Right pectoralis major 50 units Right latissimus dorsi 50 units  Right brachialis 75 units Right flexor digitorum profundus  25 units Right flexor digitorum superficialis 25 units Right palmaris longus 25 units  DIAGNOSTIC DATA (LABS, IMAGING, TESTING) - I reviewed patient records, labs, notes, testing and imaging myself where available.   MEDICAL HISTORY:  Terry Mayo, is a 67 year old left-handed male, accompanied by his wife, seen in request by Dr. Pearlean Brownie, Janalyn Shy S, for evaluation of botulism toxin injection for spastic right upper extremity, initial evaluation was on June 15, 2022, his primary care physician is Dr. Deboraha Sprang, Penni Homans, FNP   I reviewed and summarized the referring note. PMHX. 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 Jenene Slicker evaluation on March 11, 2022, non-ST MI on December 04, 2021, with multivessel CAD, ejection fraction 45 to 50%, status post loop recorder placement for atrial fibrillation surveillance,  Patient statin has to be stopped due to elevation of AST and ALT, now on Repatha   He is currently on dual antiplatelet agent aspirin 81 mg and Plavix 75 mg, the decision was to total duration for 1 year,  Update August 05, 2022: This is his first electrical stimulation guided xeomin injection for spastic right upper extremity, use 400 units, potential side effect explained, consent form was signed,  He has painful right shoulder, elbow with passive stretch, walking okay,  UPDATE November 26 2022 He responded well to previous injection, which has relaxed his right arm, better range of motion of right shoulder  UPDATE November 07/2022 He responded well to previous injection no side effect noted.  PHYSICAL EXAM:   Vitals:   03/01/23 1034  BP: 114/73   MOTOR: Right arm has antigravity movement of right shoulder abduction, right elbow flexion, extension, more profound distal right wrist hand weakness, no antigravity movement, mild right leg drift.  Painful right shoulder, elbow on passive stretch,  GAIT/STANCE: Push-up to get up from seated position, tendency to hold right arm in elbow flexion, dragging right leg mildly,  REVIEW OF SYSTEMS:  Full 14 system review of systems performed and notable only  for as above All other review of systems were negative.   ALLERGIES: Allergies  Allergen Reactions   Penicillins     REACTION UNKNOWN  Has patient had a PCN reaction causing immediate rash, facial/tongue/throat  swelling, SOB or lightheadedness with hypotension: UNKNOWN  Has patient had a PCN reaction causing severe rash involving mucus membranes or skin necrosis: UNKNOWN  Has patient had a PCN reaction that required hospitalization UNKNOWN  Has patient had a PCN reaction occurring within the last 10 years: UNKNOWN  If all of the above answers are "NO", then may proceed with Cephalosporin use.  Other Reaction(s): Other (see comments)  REACTION UNKNOWN Has patient had a PCN reaction causing immediate rash, facial/tongue/throat swelling, SOB or lightheadedness with hypotension: UNKNOWN Has patient had a PCN reaction causing severe rash involving mucus membranes or skin necrosis: UNKNOWN Has patient had a PCN reaction that required hospitalization UNKNOWN Has patient had a PCN reaction occurring within the last 10 years: UNKNOWN If all of the above answers are "NO", then may proceed with Cephalosporin use.    REACTION UNKNOWN Has patient had a PCN reaction causing immediate rash, facial/tongue/throat swelling, SOB or lightheadedness with hypotension: UNKNOWN Has patient had a PCN reaction causing severe rash involving mucus membranes or skin necrosis: UNKNOWN Has patient had a PCN reaction that required hospitalization UNKNOWN Has patient had a PCN reaction occurring within the last 10 years: UNKNOWN If all of the above answers are "NO", then may proceed with Cephalosporin use.    HOME MEDICATIONS: Current Outpatient Medications  Medication Sig Dispense Refill   aspirin EC 81 MG tablet Take 1 tablet (81 mg total) by mouth daily. Swallow whole. 30 tablet 3   atorvastatin (LIPITOR) 40 MG tablet Take by mouth.     busPIRone (BUSPAR) 7.5 MG tablet Take 1 tablet (7.5 mg total) by mouth 2 (two) times daily. 60 tablet 0   clopidogrel (PLAVIX) 75 MG tablet Take 1 tablet (75 mg total) by mouth daily with breakfast. 30 tablet 0   Evolocumab (REPATHA SURECLICK) 140 MG/ML SOAJ Inject 140 mg into the skin every 14  days. 6 mL 3   ezetimibe (ZETIA) 10 MG tablet Take 1 tablet (10 mg total) by mouth daily. 90 tablet 3   hydrOXYzine (VISTARIL) 25 MG capsule Take 25 mg by mouth 2 (two) times daily as needed for anxiety.     incobotulinumtoxinA (XEOMIN) 100 units SOLR injection Inject 600 Units into the muscle every 3 (three) months. 6 each 0   isosorbide dinitrate (ISORDIL) 20 MG tablet Take 1 tablet (20 mg total) by mouth 2 (two) times daily. 60 tablet 0   losartan (COZAAR) 25 MG tablet Take 1 tablet (25 mg total) by mouth daily. 30 tablet 0   metoprolol succinate (TOPROL-XL) 25 MG 24 hr tablet Take by mouth.     metoprolol tartrate (LOPRESSOR) 25 MG tablet Take 0.5 tablets (12.5 mg total) by mouth 2 (two) times daily. 60 tablet 0   mirtazapine (REMERON) 15 MG tablet Take 15 mg by mouth at bedtime.     nitroGLYCERIN (NITROSTAT) 0.4 MG SL tablet Place 1 tablet (0.4 mg total) under the tongue every 5 (five) minutes x 3 doses as needed for chest pain. 20 tablet 0   omeprazole (PRILOSEC OTC) 20 MG tablet Take 20 mg by mouth as needed (acid reflux).     ondansetron (ZOFRAN-ODT) 4 MG disintegrating tablet Take by mouth.     pantoprazole sodium (PROTONIX) 40 mg Take 40 mg by mouth  daily. (Patient taking differently: Take 40 mg by mouth as needed.) 30 each 0   predniSONE (DELTASONE) 20 MG tablet Take by mouth.     spironolactone (ALDACTONE) 25 MG tablet Take 0.5 tablets (12.5 mg total) by mouth daily. 15 tablet 0   traZODone (DESYREL) 50 MG tablet 1 tablet at bedtime as needed Orally Once a day for 30 day(s)     Current Facility-Administered Medications  Medication Dose Route Frequency Provider Last Rate Last Admin   incobotulinumtoxinA (XEOMIN) 100 units injection 400 Units  400 Units Intramuscular Q90 days Levert Feinstein, MD   400 Units at 08/05/22 1416   incobotulinumtoxinA (XEOMIN) 100 units injection 400 Units  400 Units Intramuscular Q90 days Levert Feinstein, MD       incobotulinumtoxinA (XEOMIN) 100 units injection 400  Units  400 Units Intramuscular Once Levert Feinstein, MD        PAST MEDICAL HISTORY: Past Medical History:  Diagnosis Date   Arthritis    Essential hypertension    Hyperlipidemia    Kidney stones    MI (myocardial infarction) (HCC)    Stroke [facility]     PAST SURGICAL HISTORY: Past Surgical History:  Procedure Laterality Date   CHOLECYSTECTOMY  1990s   COLONOSCOPY WITH PROPOFOL N/A 09/30/2015   Procedure: COLONOSCOPY WITH PROPOFOL;  Surgeon: Corbin Ade, MD;  [facility];  Service: Endoscopy;  Laterality: N/A;  930   IR CT HEAD LTD  12/10/2021   IR PERCUTANEOUS ART THROMBECTOMY/INFUSION INTRACRANIAL INC DIAG ANGIO  12/10/2021   IR US GUIDE VASC ACCESS RIGHT  12/10/2021   KIDNEY STONE SURGERY  2005   LEFT HEART CATH AND CORONARY ANGIOGRAPHY N/A 12/04/2021   Procedure: LEFT HEART CATH AND CORONARY ANGIOGRAPHY;  Surgeon: Lyn Records, MD;  [facility];  Service: Cardiovascular;  Laterality: N/A;   LOOP RECORDER INSERTION N/A 12/16/2021   Procedure: LOOP RECORDER INSERTION;  Surgeon: Regan Lemming, MD;  Location: [facility];  Service: Cardiovascular;  Laterality: N/A;   RADIOLOGY WITH ANESTHESIA N/A 12/10/2021   Procedure: IR WITH ANESTHESIA;  Surgeon: Julieanne Cotton, MD;  Location: [facility] OR;  Service: Radiology;  Laterality: N/A;    FAMILY HISTORY: Family History  Problem Relation Age of Onset   Coronary artery disease Sister    Colon cancer Neg Hx     SOCIAL HISTORY: Social History   Socioeconomic History   Marital status: Single    Spouse name: Not on file   Number of children: Not on file   Years of education: Not on file   Highest education level: Not on file  Occupational History   Not on file  Tobacco Use   Smoking status: Former    Current packs/day: 0.00    Types: Cigarettes    Quit date: 08/05/2015    Years since quitting: 7.5    Passive exposure: Past   Smokeless tobacco: Never  Vaping Use   Vaping status: Never Used   Substance and Sexual Activity   Alcohol use: Not Currently    Alcohol/week: 0.0 standard drinks of alcohol    Comment: No ETOH in a month. Variable, up to daily, as much as much as a fifth of liquor per sitting.   Drug use: No    Frequency: 7.0 times per week    Comment: As often as daily when he does smoke marijuana   Sexual activity: Yes    Birth control/protection: None  Other Topics Concern   Not  on file  Social History Narrative   Not on file   Social Determinants of Health   Financial Resource Strain: Not on file  Food Insecurity: Not on file  Transportation Needs: Not on file  Physical Activity: Not on file  Stress: Not on file  Social Connections: Not on file  Intimate Partner Violence: Not on file      Levert Feinstein, M.D. Ph.D.  [facility] [address] [place], [place] [address] Ph: [phone] Fax: [phone]  CC:  No referring provider defined for this encounter.  Ardath Sax, FNP

## 2023-03-02 ENCOUNTER — Ambulatory Visit (HOSPITAL_COMMUNITY)
Admission: RE | Admit: 2023-03-02 | Discharge: 2023-03-02 | Disposition: A | Discharge status: Home / Self Care | Payer: Medicare HMO | Source: Ambulatory Visit | Attending: Neurology | Admitting: Neurology

## 2023-03-02 ENCOUNTER — Ambulatory Visit (HOSPITAL_BASED_OUTPATIENT_CLINIC_OR_DEPARTMENT_OTHER)
Admission: RE | Admit: 2023-03-02 | Discharge: 2023-03-02 | Disposition: A | Discharge status: Home / Self Care | Payer: Medicare HMO | Source: Ambulatory Visit | Attending: Neurology | Admitting: Neurology

## 2023-03-02 DIAGNOSIS — Z8673 Personal history of transient ischemic attack (TIA), and cerebral infarction without residual deficits: Secondary | ICD-10-CM | POA: Insufficient documentation

## 2023-03-05 ENCOUNTER — Other Ambulatory Visit (HOSPITAL_COMMUNITY): Payer: Self-pay

## 2023-03-08 ENCOUNTER — Encounter: Payer: Self-pay | Admitting: Podiatry

## 2023-03-08 ENCOUNTER — Telehealth: Payer: Self-pay

## 2023-03-08 ENCOUNTER — Ambulatory Visit (INDEPENDENT_AMBULATORY_CARE_PROVIDER_SITE_OTHER): Payer: Medicare HMO | Admitting: Podiatry

## 2023-03-08 DIAGNOSIS — M79675 Pain in left toe(s): Secondary | ICD-10-CM | POA: Diagnosis not present

## 2023-03-08 DIAGNOSIS — B351 Tinea unguium: Secondary | ICD-10-CM | POA: Diagnosis not present

## 2023-03-08 DIAGNOSIS — M79674 Pain in right toe(s): Secondary | ICD-10-CM

## 2023-03-08 DIAGNOSIS — Z7902 Long term (current) use of antithrombotics/antiplatelets: Secondary | ICD-10-CM | POA: Diagnosis not present

## 2023-03-08 NOTE — Progress Notes (Unsigned)
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   Patient notes nails are thick, discolored, elongated and painful in shoegear when trying to ambulate.  His wife is with him today.  It is getting more difficult to trim his nails at home.  He does take blood thinning agents daily.  PCP is Ardath Sax, FNP.  Past Medical History:  Diagnosis Date   Arthritis    Essential hypertension    Hyperlipidemia    Kidney stones    MI (myocardial infarction) (HCC)    Stroke [facility])     Past Surgical History:  Procedure Laterality Date   CHOLECYSTECTOMY  1990s   COLONOSCOPY WITH PROPOFOL N/A 09/30/2015   Procedure: COLONOSCOPY WITH PROPOFOL;  Surgeon: Corbin Ade, MD;  [facility];  Service: Endoscopy;  Laterality: N/A;  930   IR CT HEAD LTD  12/10/2021   IR PERCUTANEOUS ART THROMBECTOMY/INFUSION INTRACRANIAL INC DIAG ANGIO  12/10/2021   IR US GUIDE VASC ACCESS RIGHT  12/10/2021   KIDNEY STONE SURGERY  2005   LEFT HEART CATH AND CORONARY ANGIOGRAPHY N/A 12/04/2021   Procedure: LEFT HEART CATH AND CORONARY ANGIOGRAPHY;  Surgeon: Lyn Records, MD;  [facility];  Service: Cardiovascular;  Laterality: N/A;   LOOP RECORDER INSERTION N/A 12/16/2021   Procedure: LOOP RECORDER INSERTION;  Surgeon: Regan Lemming, MD;  [facility];  Service: Cardiovascular;  Laterality: N/A;   RADIOLOGY WITH ANESTHESIA N/A 12/10/2021   Procedure: IR WITH ANESTHESIA;  Surgeon: Julieanne Cotton, MD;  [facility];  Service: Radiology;  Laterality: N/A;    Allergies  Allergen Reactions   Penicillins     REACTION UNKNOWN  Has patient had a PCN reaction causing immediate rash, facial/tongue/throat swelling, SOB or lightheadedness with hypotension: UNKNOWN  Has patient had a PCN reaction  causing severe rash involving mucus membranes or skin necrosis: UNKNOWN  Has patient had a PCN reaction that required hospitalization UNKNOWN  Has patient had a PCN reaction occurring within the last 10 years: UNKNOWN  If all of the above answers are "NO", then may proceed with Cephalosporin use.  Other Reaction(s): Other (see comments)  REACTION UNKNOWN Has patient had a PCN reaction causing immediate rash, facial/tongue/throat swelling, SOB or lightheadedness with hypotension: UNKNOWN Has patient had a PCN reaction causing severe rash involving mucus membranes or skin necrosis: UNKNOWN Has patient had a PCN reaction that required hospitalization UNKNOWN Has patient had a PCN reaction occurring within the last 10 years: UNKNOWN If all of the above answers are "NO", then may proceed with Cephalosporin use.    REACTION UNKNOWN Has patient had a PCN reaction causing immediate rash, facial/tongue/throat swelling, SOB or lightheadedness with hypotension: UNKNOWN Has patient had a PCN reaction causing severe rash involving mucus membranes or skin necrosis: UNKNOWN Has patient had a PCN reaction that required hospitalization UNKNOWN Has patient had a PCN reaction occurring within the last 10 years: UNKNOWN If all of the above answers are "NO", then may proceed with Cephalosporin use.    Review of Systems: Negative except as noted in the HPI.  Objective:  Terry Mayo is a pleasant 67 y.o. male in NAD. AAO x 3.  Vascular Examination: Capillary refill  time is 3-5 seconds to toes bilateral. Palpable pedal pulses b/l LE. Digital hair present b/l.  Skin temperature gradient WNL b/l. No varicosities b/l. No cyanosis noted b/l.   Dermatological Examination: Pedal skin with normal turgor, texture and tone b/l. No open wounds. No interdigital macerations b/l. Toenails x10 are 3mm thick, discolored, dystrophic with subungual debris. There is pain with compression of the nail plates.  They are elongated  x10  Assessment/Plan: 1. Pain due to onychomycosis of toenails of both feet   2. Long term (current) use of antithrombotics/antiplatelets    The mycotic toenails were sharply debrided x10 with sterile nail nippers and a power debriding burr to decrease bulk/thickness and length.    His wife stated they would prefer to call on an as-needed basis when he needs the nails trimmed again.  Informed them that insurance will typically allow this care performed every 3 months.   Clerance Lav, DPM, FACFAS [facility]     [address] [place], [place] [address]                Office [phone]  Fax [phone]

## 2023-03-08 NOTE — Progress Notes (Signed)
Kindly inform the patient that carotid ultrasound study shows no significant narrowing of either carotid artery in the neck

## 2023-03-08 NOTE — Telephone Encounter (Signed)
-----   Message from Delia Heady sent at 03/08/2023  9:39 AM EST ----- Kindly inform the patient that carotid ultrasound study shows no significant narrowing of either carotid artery in the neck

## 2023-03-08 NOTE — Progress Notes (Signed)
Kindly inform the patient that transcranial Doppler study shows slightly low mean flow velocities in all the blood vessels in the brain this is a finding of unclear significance.  Nothing to worry about

## 2023-03-08 NOTE — Telephone Encounter (Signed)
Called patient and spoke with his wife about is Ultasound, Kindly inform the patient that transcranial Doppler study shows slightly low mean flow velocities in all the blood vessels in the brain this is a finding of unclear significance.  Nothing to worry about Pt verbalized understanding. Pt had no questions at this time but was encouraged to call back if questions arise.

## 2023-03-15 ENCOUNTER — Ambulatory Visit (HOSPITAL_COMMUNITY): Payer: Medicare HMO | Attending: Emergency Medicine

## 2023-03-15 DIAGNOSIS — I63232 Cerebral infarction due to unspecified occlusion or stenosis of left carotid arteries: Secondary | ICD-10-CM

## 2023-03-15 DIAGNOSIS — Z8673 Personal history of transient ischemic attack (TIA), and cerebral infarction without residual deficits: Secondary | ICD-10-CM | POA: Insufficient documentation

## 2023-03-15 LAB — CUP PACEART REMOTE DEVICE CHECK
Date Time Interrogation Session: 20241117232421
Implantable Pulse Generator Implant Date: 20230822

## 2023-04-08 NOTE — Progress Notes (Signed)
[facility] Summary Report / Loop Recorder 

## 2023-04-19 ENCOUNTER — Ambulatory Visit (HOSPITAL_COMMUNITY): Payer: Medicare HMO | Attending: Emergency Medicine

## 2023-04-19 DIAGNOSIS — Z8673 Personal history of transient ischemic attack (TIA), and cerebral infarction without residual deficits: Secondary | ICD-10-CM | POA: Insufficient documentation

## 2023-04-19 LAB — CUP PACEART REMOTE DEVICE CHECK
Date Time Interrogation Session: 20241222231638
Implantable Pulse Generator Implant Date: 20230822

## 2023-05-10 ENCOUNTER — Telehealth: Payer: Self-pay | Admitting: Neurology

## 2023-05-10 NOTE — Telephone Encounter (Signed)
 Submitted auth request via CMM, status is pending. Key: [id number]

## 2023-05-11 NOTE — Telephone Encounter (Signed)
 Received approval, pt will continue to be buy/bill.  Auth#: [id number] (05/29/23-04/26/24)

## 2023-05-22 ENCOUNTER — Other Ambulatory Visit: Payer: Self-pay | Admitting: Internal Medicine

## 2023-05-22 DIAGNOSIS — I214 Non-ST elevation (NSTEMI) myocardial infarction: Secondary | ICD-10-CM

## 2023-05-22 DIAGNOSIS — Z8673 Personal history of transient ischemic attack (TIA), and cerebral infarction without residual deficits: Secondary | ICD-10-CM

## 2023-05-22 DIAGNOSIS — I25118 Atherosclerotic heart disease of native coronary artery with other forms of angina pectoris: Secondary | ICD-10-CM

## 2023-05-24 ENCOUNTER — Ambulatory Visit (HOSPITAL_COMMUNITY): Payer: Medicare HMO | Attending: Emergency Medicine

## 2023-05-24 DIAGNOSIS — I63232 Cerebral infarction due to unspecified occlusion or stenosis of left carotid arteries: Secondary | ICD-10-CM

## 2023-05-24 DIAGNOSIS — Z8673 Personal history of transient ischemic attack (TIA), and cerebral infarction without residual deficits: Secondary | ICD-10-CM | POA: Insufficient documentation

## 2023-05-24 LAB — CUP PACEART REMOTE DEVICE CHECK
Date Time Interrogation Session: 20250126231855
Implantable Pulse Generator Implant Date: 20230822

## 2023-05-27 NOTE — Progress Notes (Signed)
[facility] Summary Report / Loop Recorder

## 2023-06-02 ENCOUNTER — Ambulatory Visit: Payer: Medicare HMO | Admitting: Neurology

## 2023-06-03 ENCOUNTER — Ambulatory Visit (INDEPENDENT_AMBULATORY_CARE_PROVIDER_SITE_OTHER): Payer: Medicare HMO | Admitting: Neurology

## 2023-06-03 DIAGNOSIS — G8191 Hemiplegia, unspecified affecting right dominant side: Secondary | ICD-10-CM | POA: Diagnosis not present

## 2023-06-03 DIAGNOSIS — G8111 Spastic hemiplegia affecting right dominant side: Secondary | ICD-10-CM | POA: Diagnosis not present

## 2023-06-03 MED ORDER — INCOBOTULINUMTOXINA 100 UNITS IM SOLR
400.0000 [IU] | Freq: Once | INTRAMUSCULAR | Status: AC
Start: 2023-06-03 — End: 2023-06-03
  Administered 2023-06-03: 400 [IU] via INTRAMUSCULAR

## 2023-06-03 NOTE — Progress Notes (Signed)
 Chief Complaint  Patient presents with   Injections    Pt in 14,  Pt is here for Xeomin  injections. Pt stated he would like to stop getting this injections for a while.       ASSESSMENT AND PLAN  Terry Mayo is a 68 y.o. 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,  With residual spastic right upper extremity more than lower extremity weakness, painful right shoulder, elbow on passive stretch  EMG guided xeomin  injection for spastic right upper extremity, asked for 400 units  Vascular risk factor of aging, hypertension, hyperlipidemia, history of smoker, coronary artery disease, on dual antiplatelet agent aspirin  81+ Plavix  75 on 27 November 2022,  Status post loop recorder  First electrical stimulation guided xeomin  injection for spastic right upper extremity, use 400 units, (100 units was dissolved into 2 cc of normal saline)  Under electrical stimulation  Right pronator teres 100 units Right pectoralis major 50 units Right latissimus dorsi 50 units  Right brachialis 100 units Right flexor digitorum profundus 25  units Right flexor digitorum superficialis 25 units Right palmaris longus 25 units Right pronator teres 25 units  DIAGNOSTIC DATA (LABS, IMAGING, TESTING) - I reviewed patient records, labs, notes, testing and imaging myself where available.   MEDICAL HISTORY:  Terry Mayo, is a 68 year old left-handed male, accompanied by his wife, seen in request by Dr. Rosemarie, Eather S, for evaluation of botulism toxin injection for spastic right upper extremity, initial evaluation was on June 15, 2022, his primary care physician is Dr. Margarete, Maeola DASEN, FNP   I reviewed and summarized the referring note. PMHX. 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 Stacia evaluation on March 11, 2022, non-ST MI on December 04, 2021, with multivessel CAD, ejection fraction 45 to 50%, status post loop recorder placement for atrial fibrillation surveillance,  Patient statin has to be stopped due to elevation of AST and ALT, now on Repatha    He is currently on dual antiplatelet agent aspirin  81 mg and Plavix  75 mg, the decision was to total duration for 1 year,  Update August 05, 2022: This is his first electrical stimulation guided xeomin  injection for spastic right upper extremity, use 400 units, potential side effect explained, consent form was signed,  He has painful right shoulder, elbow with passive stretch, walking okay,  UPDATE November 26 2022 He responded well to previous injection, which has relaxed his right arm, better range of motion of right shoulder  UPDATE November 07/2022 He responded well to previous injection no side effect noted.  UPDATE Feb 6th 2025: He responded somewhat to previous injection, would like to complete injection today, further injection only if he needs it again  PHYSICAL EXAM:      03/01/2023   10:34 AM 02/25/2023   11:27 AM 01/27/2023    1:41 PM  Vitals with BMI  Height  6' 2 6' 2  Weight  196 lbs 196 lbs 6  oz  BMI  25.15 25.21  Systolic 114 111 892  Diastolic 73 61 65  Pulse  53 50     MOTOR:  Antigravity movement of right shoulder abduction, right elbow flexion, extension, more profound distal right wrist hand weakness, no antigravity movement, mild right leg drift.  Painful right shoulder, elbow on passive  stretch,  GAIT/STANCE: Push-up to get up from seated position, tendency to hold right arm in elbow flexion, dragging right leg mildly,  REVIEW OF SYSTEMS:  Full 14 system review of systems performed and notable only for as above All other review of systems were negative.   ALLERGIES: Allergies  Allergen Reactions   Penicillins     REACTION UNKNOWN  Has patient had a PCN reaction causing immediate rash, facial/tongue/throat swelling, SOB or lightheadedness with hypotension: UNKNOWN  Has patient had a PCN reaction causing severe rash involving mucus membranes or skin necrosis: UNKNOWN  Has patient had a PCN reaction that required hospitalization UNKNOWN  Has patient had a PCN reaction occurring within the last 10 years: UNKNOWN  If all of the above answers are NO, then may proceed with Cephalosporin use.  Other Reaction(s): Other (see comments)  REACTION UNKNOWN Has patient had a PCN reaction causing immediate rash, facial/tongue/throat swelling, SOB or lightheadedness with hypotension: UNKNOWN Has patient had a PCN reaction causing severe rash involving mucus membranes or skin necrosis: UNKNOWN Has patient had a PCN reaction that required hospitalization UNKNOWN Has patient had a PCN reaction occurring within the last 10 years: UNKNOWN If all of the above answers are NO, then may proceed with Cephalosporin use.    REACTION UNKNOWN Has patient had a PCN reaction causing immediate rash, facial/tongue/throat swelling, SOB or lightheadedness with hypotension: UNKNOWN Has patient had a PCN reaction causing severe rash involving mucus membranes or skin necrosis: UNKNOWN Has patient had a PCN reaction that required hospitalization UNKNOWN Has patient had a PCN reaction occurring within the last 10 years: UNKNOWN If all of the above answers are NO, then may proceed with Cephalosporin use.    HOME MEDICATIONS: Current Outpatient Medications  Medication Sig Dispense Refill   aspirin  EC  81 MG tablet Take 1 tablet (81 mg total) by mouth daily. Swallow whole. 30 tablet 3   atorvastatin  (LIPITOR ) 40 MG tablet Take by mouth.     busPIRone  (BUSPAR ) 7.5 MG tablet Take 1 tablet (7.5 mg total) by mouth 2 (two) times daily. 60 tablet 0   clopidogrel  (PLAVIX ) 75 MG tablet Take 1 tablet (75 mg total) by mouth daily with breakfast. 30 tablet 0   Evolocumab  (REPATHA  SURECLICK) 140 MG/ML SOAJ INJECT 140MG  INTO THE SKIN EVERY 14 DAYS. 6 mL 11   ezetimibe  (ZETIA ) 10 MG tablet Take 1 tablet (10 mg total) by mouth daily. 90 tablet 3   hydrOXYzine (VISTARIL) 25 MG capsule Take 25 mg by mouth 2 (two) times daily as needed for anxiety.     incobotulinumtoxinA  (XEOMIN ) 100 units SOLR injection Inject 600 Units into the muscle every 3 (three) months. 6 each 0   isosorbide  dinitrate (ISORDIL ) 20 MG tablet Take 1 tablet (20 mg total) by mouth 2 (two) times daily. 60 tablet 0   losartan  (COZAAR ) 25 MG tablet Take 1 tablet (25 mg total) by mouth daily. 30 tablet 0   metoprolol  succinate (TOPROL -XL) 25 MG 24 hr tablet Take by mouth.     metoprolol  tartrate (LOPRESSOR ) 25 MG tablet Take 0.5 tablets (12.5 mg total) by mouth 2 (two) times daily. 60  tablet 0   mirtazapine (REMERON) 15 MG tablet Take 15 mg by mouth at bedtime.     nitroGLYCERIN  (NITROSTAT ) 0.4 MG SL tablet Place 1 tablet (0.4 mg total) under the tongue every 5 (five) minutes x 3 doses as needed for chest pain. 20 tablet 0   omeprazole  (PRILOSEC OTC) 20 MG tablet Take 20 mg by mouth as needed (acid reflux).     ondansetron  (ZOFRAN -ODT) 4 MG disintegrating tablet Take by mouth.     pantoprazole  sodium (PROTONIX ) 40 mg Take 40 mg by mouth daily. (Patient taking differently: Take 40 mg by mouth as needed.) 30 each 0   predniSONE  (DELTASONE ) 20 MG tablet Take by mouth.     spironolactone  (ALDACTONE ) 25 MG tablet Take 0.5 tablets (12.5 mg total) by mouth daily. 15 tablet 0   traZODone (DESYREL) 50 MG tablet 1 tablet at bedtime as needed Orally Once a  day for 30 day(s)     Current Facility-Administered Medications  Medication Dose Route Frequency Provider Last Rate Last Admin   incobotulinumtoxinA  (XEOMIN ) 100 units injection 400 Units  400 Units Intramuscular Q90 days Onita Duos, MD   400 Units at 08/05/22 1416   incobotulinumtoxinA  (XEOMIN ) 100 units injection 400 Units  400 Units Intramuscular Q90 days Onita Duos, MD       incobotulinumtoxinA  (XEOMIN ) 100 units injection 400 Units  400 Units Intramuscular Once Onita Duos, MD        PAST MEDICAL HISTORY: Past Medical History:  Diagnosis Date   Arthritis    Essential hypertension    Hyperlipidemia    Kidney stones    MI (myocardial infarction) (HCC)    Stroke [facility])     PAST SURGICAL HISTORY: Past Surgical History:  Procedure Laterality Date   CHOLECYSTECTOMY  1990s   COLONOSCOPY WITH PROPOFOL  N/A 09/30/2015   Procedure: COLONOSCOPY WITH PROPOFOL ;  Surgeon: Lamar CHRISTELLA Hollingshead, MD;  [facility];  Service: Endoscopy;  Laterality: N/A;  930   IR CT HEAD LTD  12/10/2021   IR PERCUTANEOUS ART THROMBECTOMY/INFUSION INTRACRANIAL INC DIAG ANGIO  12/10/2021   IR US  GUIDE VASC ACCESS RIGHT  12/10/2021   KIDNEY STONE SURGERY  2005   LEFT HEART CATH AND CORONARY ANGIOGRAPHY N/A 12/04/2021   Procedure: LEFT HEART CATH AND CORONARY ANGIOGRAPHY;  Surgeon: Claudene Victory ORN, MD;  [facility] IN[facility];  Service: Cardiovascular;  Laterality: N/A;   LOOP RECORDER INSERTION N/A 12/16/2021   Procedure: LOOP RECORDER INSERTION;  Surgeon: Inocencio Soyla Lunger, MD;  Location: [facility];  Service: Cardiovascular;  Laterality: N/A;   RADIOLOGY WITH ANESTHESIA N/A 12/10/2021   Procedure: IR WITH ANESTHESIA;  Surgeon: Dolphus Carrion, MD;  Location: [facility] OR;  Service: Radiology;  Laterality: N/A;    FAMILY HISTORY: Family History  Problem Relation Age of Onset   Coronary artery disease Sister    Colon cancer Neg Hx     SOCIAL HISTORY: Social History   Socioeconomic History    Marital status: Single    Spouse name: Not on file   Number of children: Not on file   Years of education: Not on file   Highest education level: Not on file  Occupational History   Not on file  Tobacco Use   Smoking status: Former    Current packs/day: 0.00    Types: Cigarettes    Quit date: 08/05/2015    Years since quitting: 7.8    Passive exposure: Past   Smokeless tobacco: Never  Vaping Use  Vaping status: Never Used  Substance and Sexual Activity   Alcohol use: Not Currently    Alcohol/week: 0.0 standard drinks of alcohol    Comment: No ETOH in a month. Variable, up to daily, as much as much as a fifth of liquor per sitting.   Drug use: No    Frequency: 7.0 times per week    Comment: As often as daily when he does smoke marijuana   Sexual activity: Yes    Birth control/protection: None  Other Topics Concern   Not on file  Social History Narrative   Not on file   Social Drivers of Health   Financial Resource Strain: Not on file  Food Insecurity: Not on file  Transportation Needs: Not on file  Physical Activity: Not on file  Stress: Not on file  Social Connections: Not on file  Intimate Partner Violence: Not on file      Modena Callander, M.D. Ph.D.  [facility] [address] [place], [place] [address] Ph: [phone] Fax: [phone]  CC:  Margarete Maeola DASEN, FNP [address] [place],  [place] [address]  Margarete Maeola DASEN, FNP

## 2023-06-03 NOTE — Progress Notes (Signed)
 xeomin  100 units x 4 vial  [id number] [id number] Exp-04/2025 B/B  Bacteriostatic 0.9% Sodium Chloride - 8 mL  [id number] Expiration: 02/26/2024 NDC: 6301-6010-93 Dx: G81.91 WITNESSED AT:[facility] CMA

## 2023-06-28 ENCOUNTER — Ambulatory Visit (HOSPITAL_COMMUNITY): Payer: Medicare HMO | Attending: Emergency Medicine

## 2023-06-28 DIAGNOSIS — I63232 Cerebral infarction due to unspecified occlusion or stenosis of left carotid arteries: Secondary | ICD-10-CM | POA: Diagnosis not present

## 2023-06-29 LAB — CUP PACEART REMOTE DEVICE CHECK
Date Time Interrogation Session: 20250302231126
Implantable Pulse Generator Implant Date: 20230822

## 2023-07-02 NOTE — Progress Notes (Signed)
 [facility] Summary Report / Loop Recorder

## 2023-07-29 NOTE — Progress Notes (Signed)
 [facility] Summary Report / Loop Recorder

## 2023-07-29 NOTE — Addendum Note (Signed)
 Addended by: Geralyn Flash D on: 07/29/2023 03:35 PM   Modules accepted: Orders

## 2023-08-02 ENCOUNTER — Ambulatory Visit (HOSPITAL_COMMUNITY): Payer: Medicare HMO | Attending: Emergency Medicine

## 2023-08-02 DIAGNOSIS — Z8673 Personal history of transient ischemic attack (TIA), and cerebral infarction without residual deficits: Secondary | ICD-10-CM

## 2023-08-02 DIAGNOSIS — Z95818 Presence of other cardiac implants and grafts: Secondary | ICD-10-CM | POA: Diagnosis not present

## 2023-08-02 DIAGNOSIS — I63232 Cerebral infarction due to unspecified occlusion or stenosis of left carotid arteries: Secondary | ICD-10-CM | POA: Insufficient documentation

## 2023-08-03 LAB — CUP PACEART REMOTE DEVICE CHECK
Date Time Interrogation Session: 20250406231229
Implantable Pulse Generator Implant Date: 20230822

## 2023-09-01 ENCOUNTER — Ambulatory Visit: Payer: Medicare HMO | Admitting: Neurology

## 2023-09-06 ENCOUNTER — Ambulatory Visit (HOSPITAL_COMMUNITY): Payer: Medicare HMO | Attending: Emergency Medicine

## 2023-09-06 DIAGNOSIS — I63232 Cerebral infarction due to unspecified occlusion or stenosis of left carotid arteries: Secondary | ICD-10-CM | POA: Diagnosis not present

## 2023-09-06 LAB — CUP PACEART REMOTE DEVICE CHECK
Date Time Interrogation Session: 20250511234547
Implantable Pulse Generator Implant Date: 20230822

## 2023-09-08 ENCOUNTER — Ambulatory Visit: Payer: Self-pay | Admitting: Cardiology

## 2023-09-15 NOTE — Progress Notes (Signed)
 [facility] Summary Report / Loop Recorder

## 2023-09-15 NOTE — Addendum Note (Signed)
 Addended by: Edra Govern D on: 09/15/2023 10:41 AM   Modules accepted: Orders

## 2023-10-07 ENCOUNTER — Ambulatory Visit (HOSPITAL_COMMUNITY): Attending: Emergency Medicine

## 2023-10-07 DIAGNOSIS — I63232 Cerebral infarction due to unspecified occlusion or stenosis of left carotid arteries: Secondary | ICD-10-CM | POA: Diagnosis not present

## 2023-10-11 ENCOUNTER — Ambulatory Visit: Payer: Medicare HMO

## 2023-10-12 ENCOUNTER — Ambulatory Visit (INDEPENDENT_AMBULATORY_CARE_PROVIDER_SITE_OTHER): Admitting: Podiatry

## 2023-10-12 ENCOUNTER — Ambulatory Visit: Payer: Self-pay | Admitting: Cardiology

## 2023-10-12 DIAGNOSIS — B351 Tinea unguium: Secondary | ICD-10-CM | POA: Diagnosis not present

## 2023-10-12 DIAGNOSIS — M79674 Pain in right toe(s): Secondary | ICD-10-CM | POA: Diagnosis not present

## 2023-10-12 DIAGNOSIS — M79675 Pain in left toe(s): Secondary | ICD-10-CM | POA: Diagnosis not present

## 2023-10-12 NOTE — Progress Notes (Signed)
 Subjective:  Patient ID: Terry Mayo, male    DOB: 1955/07/09,  MRN: [id number]  Terry Mayo presents to clinic today for:  Chief Complaint  Patient presents with   RFC    Rm20/ RFC not diabetic/no complaints    Patient notes nails are thick, discolored, elongated and painful in shoegear when trying to ambulate.  Patient states it has been 7 months since he was last seen and had his nails trimmed.  He has not trim them since then.  He is accompanied by a male companion/spouse.  He notes his right great toenail has gotten very thick.  PCP is Ayesha Lente, FNP.  Past Medical History:  Diagnosis Date   Arthritis    Essential hypertension    Hyperlipidemia    Kidney stones    MI (myocardial infarction) (HCC)    Stroke [facility])    Past Surgical History:  Procedure Laterality Date   CHOLECYSTECTOMY  1990s   COLONOSCOPY WITH PROPOFOL  N/A 09/30/2015   Procedure: COLONOSCOPY WITH PROPOFOL ;  Surgeon: Suzette Espy, MD;  [facility];  Service: Endoscopy;  Laterality: N/A;  930   IR CT HEAD LTD  12/10/2021   IR PERCUTANEOUS ART THROMBECTOMY/INFUSION INTRACRANIAL INC DIAG ANGIO  12/10/2021   IR US  GUIDE VASC ACCESS RIGHT  12/10/2021   KIDNEY STONE SURGERY  2005   LEFT HEART CATH AND CORONARY ANGIOGRAPHY N/A 12/04/2021   Procedure: LEFT HEART CATH AND CORONARY ANGIOGRAPHY;  Surgeon: Arty Binning, MD;  [facility];  Service: Cardiovascular;  Laterality: N/A;   LOOP RECORDER INSERTION N/A 12/16/2021   Procedure: LOOP RECORDER INSERTION;  Surgeon: Lei Pump, MD;  [facility];  Service: Cardiovascular;  Laterality: N/A;   RADIOLOGY WITH ANESTHESIA N/A 12/10/2021   Procedure: IR WITH ANESTHESIA;  Surgeon: Luellen Sages, MD;  Location: [facility] OR;  Service: Radiology;  Laterality: N/A;   Allergies  Allergen Reactions   Penicillins     REACTION UNKNOWN  Has patient had a PCN reaction causing immediate rash, facial/tongue/throat  swelling, SOB or lightheadedness with hypotension: UNKNOWN  Has patient had a PCN reaction causing severe rash involving mucus membranes or skin necrosis: UNKNOWN  Has patient had a PCN reaction that required hospitalization UNKNOWN  Has patient had a PCN reaction occurring within the last 10 years: UNKNOWN  If all of the above answers are NO, then may proceed with Cephalosporin use.  Other Reaction(s): Other (see comments)  REACTION UNKNOWN Has patient had a PCN reaction causing immediate rash, facial/tongue/throat swelling, SOB or lightheadedness with hypotension: UNKNOWN Has patient had a PCN reaction causing severe rash involving mucus membranes or skin necrosis: UNKNOWN Has patient had a PCN reaction that required hospitalization UNKNOWN Has patient had a PCN reaction occurring within the last 10 years: UNKNOWN If all of the above answers are NO, then may proceed with Cephalosporin use.    REACTION UNKNOWN Has patient had a PCN reaction causing immediate rash, facial/tongue/throat swelling, SOB or lightheadedness with hypotension: UNKNOWN Has patient had a PCN reaction causing severe rash involving mucus membranes or skin necrosis: UNKNOWN Has patient had a PCN reaction that required hospitalization UNKNOWN Has patient had a PCN reaction occurring within the last 10 years: UNKNOWN If all of the above answers are NO, then may proceed with Cephalosporin use.    Review of Systems: Negative except as noted in the HPI.  Objective:  Terry Mayo is a pleasant 68 y.o.  male in NAD. AAO x 3.  Vascular Examination: Capillary refill time is 3-5 seconds to toes bilateral. Palpable pedal pulses b/l LE. Digital hair present b/l.  Skin temperature gradient WNL b/l. No varicosities b/l. No cyanosis noted b/l.   Dermatological Examination: Pedal skin with normal turgor, texture and tone b/l. No open wounds. No interdigital macerations b/l. Toenails x10 are 3mm thick, discolored, dystrophic with  subungual debris. There is pain with compression of the nail plates.  They are elongated x10.  The right great toenail is approximately 9 mm thick and severely mycotic/dystrophic.  Assessment/Plan: 1. Pain due to onychomycosis of toenails of both feet    The mycotic/gryphotic toenails were sharply debrided x10 with sterile nail nippers and a power debriding burr to decrease bulk/thickness and length.    Patient will call as needed for at risk footcare.  Recommend he not wait this long to come in for nail care in the future.  At the latest I would say he should come in at 4 months.   Joe Murders, DPM, FACFAS [facility]     [address] [place], [place] [address]                Office [phone]  Fax [phone]

## 2023-10-21 ENCOUNTER — Encounter: Payer: Self-pay | Admitting: Emergency Medicine

## 2023-10-21 ENCOUNTER — Ambulatory Visit: Admission: EM | Admit: 2023-10-21 | Discharge: 2023-10-21 | Disposition: A | Discharge status: Home / Self Care

## 2023-10-21 DIAGNOSIS — R059 Cough, unspecified: Secondary | ICD-10-CM

## 2023-10-21 LAB — POC SARS CORONAVIRUS 2 AG -  ED: SARS Coronavirus 2 Ag: NEGATIVE

## 2023-10-21 MED ORDER — ALBUTEROL SULFATE HFA 108 (90 BASE) MCG/ACT IN AERS: 2.0000 | INHALATION_SPRAY | Freq: Four times a day (QID) | RESPIRATORY_TRACT | 0 refills | Status: AC | PRN

## 2023-10-21 MED ORDER — PROMETHAZINE-DM 6.25-15 MG/5ML PO SYRP: 5.0000 mL | ORAL_SOLUTION | Freq: Four times a day (QID) | ORAL | 0 refills | Status: AC | PRN

## 2023-10-21 MED ORDER — PREDNISONE 20 MG PO TABS: 40.0000 mg | ORAL_TABLET | Freq: Every day | ORAL | 0 refills | Status: AC

## 2023-10-21 NOTE — ED Provider Notes (Signed)
 [facility]    CSN: [id number] Arrival date & time: 10/21/23  9163      History   Chief Complaint No chief complaint on file.   HPI Terry Mayo is a 68 y.o. male.   The history is provided by the patient.   Patient brought in by his girlfriend for complaints of cough has been present for the past 2 days.  Patient states cough is worse at night.  He denies fever, chills, headache, ear pain, wheezing, difficulty breathing, chest pain, abdominal pain, nausea, vomiting, diarrhea, or rash.  Girlfriend reports that she has used peppermint and other natural remedies for his symptoms with minimal relief.  Patient with history of stroke.  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   Swollen limb 06/15/2022   Constipation 05/05/2022   Esophagitis 12/26/2021   Right hemiplegia (HCC) 12/17/2021   History of ischemic left ICA stroke 12/10/2021   Cerebrovascular accident (CVA) (HCC) 12/10/2021   CAD (coronary artery disease) 12/05/2021   Ischemic cardiomyopathy 12/05/2021   NSVT (nonsustained ventricular tachycardia) (HCC) 12/05/2021   Hypertension 12/05/2021   Hyperlipidemia 12/05/2021   NSTEMI (non-ST elevated myocardial infarction) (HCC) 12/04/2021   Long term (current) use of antithrombotics/antiplatelets    Loss of weight 09/04/2015   Encounter for screening colonoscopy 09/04/2015    Past Surgical History:  Procedure Laterality Date   CHOLECYSTECTOMY  1990s   COLONOSCOPY WITH PROPOFOL  N/A 09/30/2015   Procedure: COLONOSCOPY WITH PROPOFOL ;  Surgeon: Lamar CHRISTELLA Hollingshead, MD;  [facility];  Service: Endoscopy;  Laterality: N/A;  930   IR CT HEAD LTD  12/10/2021   IR PERCUTANEOUS ART THROMBECTOMY/INFUSION  INTRACRANIAL INC DIAG ANGIO  12/10/2021   IR US  GUIDE VASC ACCESS RIGHT  12/10/2021   KIDNEY STONE SURGERY  2005   LEFT HEART CATH AND CORONARY ANGIOGRAPHY N/A 12/04/2021   Procedure: LEFT HEART CATH AND CORONARY ANGIOGRAPHY;  Surgeon: Claudene Victory ORN, MD;  [facility];  Service: Cardiovascular;  Laterality: N/A;   LOOP RECORDER INSERTION N/A 12/16/2021   Procedure: LOOP RECORDER INSERTION;  Surgeon: Inocencio Soyla Lunger, MD;  Location: [facility];  Service: Cardiovascular;  Laterality: N/A;   RADIOLOGY WITH ANESTHESIA N/A 12/10/2021   Procedure: IR WITH ANESTHESIA;  Surgeon: Dolphus Carrion, MD;  Location: [facility] OR;  Service: Radiology;  Laterality: N/A;       Home Medications    Prior to Admission medications   Medication Sig Start Date End Date Taking? Authorizing Provider  hydrOXYzine (ATARAX) 25 MG tablet Take 25 mg by mouth 3 (three) times daily as needed.   Yes [provider]  aspirin  EC 81 MG tablet Take 1 tablet (81 mg total) by mouth daily. Swallow whole. 12/07/21   Zhao, Xika, NP  busPIRone  (BUSPAR ) 7.5 MG tablet Take 1 tablet (7.5 mg total) by mouth 2 (two) times daily. 01/01/22   Landy Barnie RAMAN, NP  clopidogrel  (PLAVIX ) 75 MG tablet Take 1 tablet (75 mg total) by mouth daily with breakfast. 01/01/22   Landy Barnie RAMAN, NP  Evolocumab  (REPATHA  SURECLICK) 140 MG/ML SOAJ INJECT 140MG  INTO THE SKIN EVERY 14 DAYS. 05/24/23   Mallipeddi, Diannah SQUIBB, MD  ezetimibe  (ZETIA ) 10 MG tablet Take 1 tablet (10 mg total) by mouth daily. 04/24/22 04/19/23  Mallipeddi, Vishnu P, MD  incobotulinumtoxinA  (XEOMIN ) 100 units SOLR injection Inject 600 Units into the muscle every 3 (three) months. 06/15/22   Onita Duos, MD  isosorbide  dinitrate (ISORDIL ) 20 MG tablet Take 1 tablet (20 mg total) by mouth 2 (two) times daily. 01/01/22   Landy Barnie RAMAN, NP  losartan  (COZAAR ) 25 MG tablet Take 1 tablet (25 mg total) by mouth daily. 01/01/22   Landy Barnie RAMAN, NP  metoprolol  succinate  (TOPROL -XL) 25 MG 24 hr tablet Take by mouth. 12/06/21   [provider]  metoprolol  tartrate (LOPRESSOR ) 25 MG tablet Take 0.5 tablets (12.5 mg total) by mouth 2 (two) times daily. 01/01/22   Landy Barnie RAMAN, NP  mirtazapine (REMERON) 15 MG tablet Take 15 mg by mouth at bedtime. 03/26/22   [provider]  nitroGLYCERIN  (NITROSTAT ) 0.4 MG SL tablet Place 1 tablet (0.4 mg total) under the tongue every 5 (five) minutes x 3 doses as needed for chest pain. 01/01/22   Landy Barnie RAMAN, NP  omeprazole  (PRILOSEC OTC) 20 MG tablet Take 20 mg by mouth as needed (acid reflux).    [provider]  ondansetron  (ZOFRAN -ODT) 4 MG disintegrating tablet Take by mouth. 01/20/22   [provider]  pantoprazole  sodium (PROTONIX ) 40 mg Take 40 mg by mouth daily. Patient taking differently: Take 40 mg by mouth as needed. 01/01/22   Landy Barnie RAMAN, NP  predniSONE  (DELTASONE ) 20 MG tablet Take by mouth. 04/14/22   [provider]  spironolactone  (ALDACTONE ) 25 MG tablet Take 0.5 tablets (12.5 mg total) by mouth daily. 01/01/22   Landy Barnie RAMAN, NP  traZODone (DESYREL) 50 MG tablet 1 tablet at bedtime as needed Orally Once a day for 30 day(s) 01/08/22   [provider]    Family History Family History  Problem Relation Age of Onset   Coronary artery disease Sister    Colon cancer Neg Hx     Social History Social History   Tobacco Use   Smoking status: Former    Current packs/day: 0.00    Types: Cigarettes    Quit date: 08/05/2015    Years since quitting: 8.2    Passive exposure: Past   Smokeless tobacco: Never  Vaping Use   Vaping status: Never Used  Substance Use Topics   Alcohol use: Not Currently    Alcohol/week: 0.0 standard drinks of alcohol    Comment: No ETOH in a month. Variable, up to daily, as much as much as a fifth of liquor per sitting.   Drug use: No    Frequency: 7.0 times per week    Comment: As often as daily when he does smoke marijuana      Allergies   Penicillins   Review of Systems Review of Systems Per HPI  Physical Exam Triage Vital Signs ED Triage Vitals  Encounter Vitals Group     BP 10/21/23 0843 111/68     Girls Systolic BP Percentile --      Girls Diastolic BP Percentile --      Boys Systolic BP Percentile --      Boys Diastolic BP Percentile --      Pulse Rate 10/21/23 0843 64     Resp 10/21/23 0843 18     Temp 10/21/23 0843 97.9 F (36.6 C)     Temp Source 10/21/23 0843 Oral     SpO2 10/21/23 0843  95 %     Weight --      Height --      Head Circumference --      Peak Flow --      Pain Score 10/21/23 0845 0     Pain Loc --      Pain Education --      Exclude from Growth Chart --    No data found.  Updated Vital Signs BP 111/68 (BP Location: Right Arm)   Pulse 64   Temp 97.9 F (36.6 C) (Oral)   Resp 18   SpO2 95%   Visual Acuity Right Eye Distance:   Left Eye Distance:   Bilateral Distance:    Right Eye Near:   Left Eye Near:    Bilateral Near:     Physical Exam Vitals and nursing note reviewed.  Constitutional:      General: He is not in acute distress.    Appearance: Normal appearance.  HENT:     Head: Normocephalic.     Right Ear: Tympanic membrane, ear canal and external ear normal.     Left Ear: Tympanic membrane, ear canal and external ear normal.     Nose: Nose normal.     Mouth/Throat:     Mouth: Mucous membranes are moist.     Pharynx: No posterior oropharyngeal erythema.   Eyes:     Extraocular Movements: Extraocular movements intact.     Pupils: Pupils are equal, round, and reactive to light.    Cardiovascular:     Rate and Rhythm: Normal rate and regular rhythm.     Pulses: Normal pulses.     Heart sounds: Normal heart sounds.  Pulmonary:     Effort: Pulmonary effort is normal. No respiratory distress.     Breath sounds: Normal breath sounds. No stridor. No wheezing, rhonchi or rales.  Abdominal:     Palpations: Abdomen is soft.     Tenderness:  There is no abdominal tenderness.   Musculoskeletal:     Cervical back: Normal range of motion.   Skin:    General: Skin is warm and dry.   Neurological:     General: No focal deficit present.     Mental Status: He is alert and oriented to person, place, and time.   Psychiatric:        Mood and Affect: Mood normal.        Behavior: Behavior normal.      UC Treatments / Results  Labs (all labs ordered are listed, but only abnormal results are displayed) Labs Reviewed - No data to display  EKG   Radiology No results found.  Procedures Procedures (including critical care time)  Medications Ordered in UC Medications - No data to display  Initial Impression / Assessment and Plan / UC Course  I have reviewed the triage vital signs and the nursing notes.  Pertinent labs & imaging results that were available during my care of the patient were reviewed by me and considered in my medical decision making (see chart for details).  COVID test was negative. On exam, lung sounds are clear throughout, room air sats at 95%.  No indication for imaging at this time.  Symptoms consistent with viral bronchitis.  Will treat with prednisone  40 mg for the next 5 days, Promethazine  DM for the cough, and albuterol inhaler as needed for wheezing or shortness of breath.  Supportive care recommendations were provided and discussed with patient to include fluids, rest, over-the-counter Tylenol , and  use of a humidifier during sleep.  Discussed indications regarding follow-up.  Recommended follow-up with his PCP within the next 7 to 10 days for reevaluation.  Patient was in agreement with this plan of care and verbalizes understanding.  All questions were answered.  Patient stable for discharge.  Final Clinical Impressions(s) / UC Diagnoses   Final diagnoses:  None   Discharge Instructions   None    ED Prescriptions   None    PDMP not reviewed this encounter.   Gilmer Etta PARAS,  NP 10/21/23 [phone]

## 2023-10-21 NOTE — ED Triage Notes (Signed)
 Dry cough since Sunday.

## 2023-10-21 NOTE — Discharge Instructions (Signed)
 Take medication as prescribed. Increase fluids and allow for plenty of rest. You may take over-the-counter Tylenol  as needed for pain, fever, or general discomfort. Recommend the use of a humidifier in your bedroom at nighttime during sleep and sleeping elevated on pillows while cough symptoms persist. As discussed, your cough may last from days to weeks.  If you are generally feeling well but continued to have a persistent nagging cough, continue with over-the-counter cough and cold medications and cough drops for your symptoms.  Seek care immediately if you experience new symptoms such as fever, chills, wheezing, or other concerns. Follow-up with your primary care physician within the next 7 to 10 days for reevaluation. Follow-up as needed.

## 2023-10-23 ENCOUNTER — Encounter (HOSPITAL_COMMUNITY): Payer: Self-pay | Admitting: Interventional Radiology

## 2023-10-25 NOTE — Progress Notes (Signed)
 [facility] Summary Report / Loop Recorder

## 2023-11-08 ENCOUNTER — Ambulatory Visit (HOSPITAL_COMMUNITY): Attending: Emergency Medicine

## 2023-11-08 DIAGNOSIS — I63232 Cerebral infarction due to unspecified occlusion or stenosis of left carotid arteries: Secondary | ICD-10-CM | POA: Diagnosis not present

## 2023-11-08 LAB — CUP PACEART REMOTE DEVICE CHECK
Date Time Interrogation Session: 20250713233920
Implantable Pulse Generator Implant Date: 20230822

## 2023-11-09 ENCOUNTER — Ambulatory Visit: Payer: Self-pay | Admitting: Cardiology

## 2023-11-15 ENCOUNTER — Ambulatory Visit: Payer: Medicare HMO

## 2023-11-29 NOTE — Addendum Note (Signed)
 Addended by: VICCI SELLER A on: 11/29/2023 10:44 AM   Modules accepted: Orders

## 2023-11-29 NOTE — Progress Notes (Signed)
 [facility] Summary Report / Loop Recorder

## 2023-12-09 ENCOUNTER — Ambulatory Visit (HOSPITAL_COMMUNITY): Attending: Emergency Medicine

## 2023-12-09 DIAGNOSIS — I63232 Cerebral infarction due to unspecified occlusion or stenosis of left carotid arteries: Secondary | ICD-10-CM | POA: Diagnosis not present

## 2023-12-09 LAB — CUP PACEART REMOTE DEVICE CHECK
Date Time Interrogation Session: 20250813232529
Implantable Pulse Generator Implant Date: 20230822

## 2023-12-11 ENCOUNTER — Ambulatory Visit: Payer: Self-pay | Admitting: Cardiology

## 2023-12-20 ENCOUNTER — Ambulatory Visit: Payer: Medicare HMO

## 2023-12-23 ENCOUNTER — Other Ambulatory Visit: Payer: Self-pay | Admitting: Adult Health

## 2024-01-10 ENCOUNTER — Ambulatory Visit (HOSPITAL_COMMUNITY): Attending: Emergency Medicine

## 2024-01-10 DIAGNOSIS — I63232 Cerebral infarction due to unspecified occlusion or stenosis of left carotid arteries: Secondary | ICD-10-CM | POA: Insufficient documentation

## 2024-01-11 LAB — CUP PACEART REMOTE DEVICE CHECK
Date Time Interrogation Session: 20250914233402
Implantable Pulse Generator Implant Date: 20230822

## 2024-01-17 ENCOUNTER — Ambulatory Visit: Payer: Self-pay | Admitting: Cardiology

## 2024-01-17 NOTE — Progress Notes (Signed)
 Remote Loop Recorder Transmission

## 2024-01-20 NOTE — Progress Notes (Signed)
 Remote Loop Recorder Transmission

## 2024-01-24 ENCOUNTER — Ambulatory Visit: Payer: Medicare HMO

## 2024-02-02 NOTE — Progress Notes (Signed)
 Remote Loop Recorder Transmission

## 2024-02-10 ENCOUNTER — Ambulatory Visit (HOSPITAL_COMMUNITY): Attending: Emergency Medicine

## 2024-02-10 ENCOUNTER — Ambulatory Visit

## 2024-02-10 DIAGNOSIS — I63232 Cerebral infarction due to unspecified occlusion or stenosis of left carotid arteries: Secondary | ICD-10-CM | POA: Insufficient documentation

## 2024-02-10 LAB — CUP PACEART REMOTE DEVICE CHECK
Date Time Interrogation Session: 20251015232900
Implantable Pulse Generator Implant Date: 20230822

## 2024-02-16 NOTE — Progress Notes (Signed)
 Remote Loop Recorder Transmission

## 2024-02-21 ENCOUNTER — Ambulatory Visit: Payer: Self-pay | Admitting: Cardiology

## 2024-02-24 ENCOUNTER — Ambulatory Visit

## 2024-03-12 ENCOUNTER — Ambulatory Visit

## 2024-03-13 ENCOUNTER — Ambulatory Visit

## 2024-03-14 ENCOUNTER — Ambulatory Visit (HOSPITAL_COMMUNITY): Attending: Emergency Medicine

## 2024-03-14 DIAGNOSIS — I63232 Cerebral infarction due to unspecified occlusion or stenosis of left carotid arteries: Secondary | ICD-10-CM | POA: Diagnosis not present

## 2024-03-15 ENCOUNTER — Ambulatory Visit: Payer: Self-pay | Admitting: Cardiology

## 2024-03-15 LAB — CUP PACEART REMOTE DEVICE CHECK
Date Time Interrogation Session: 20251117232702
Implantable Pulse Generator Implant Date: 20230822

## 2024-03-17 NOTE — Progress Notes (Signed)
 Remote Loop Recorder Transmission

## 2024-03-27 ENCOUNTER — Ambulatory Visit

## 2024-04-12 ENCOUNTER — Ambulatory Visit

## 2024-04-14 ENCOUNTER — Ambulatory Visit (HOSPITAL_COMMUNITY): Attending: Emergency Medicine

## 2024-04-14 DIAGNOSIS — I63232 Cerebral infarction due to unspecified occlusion or stenosis of left carotid arteries: Secondary | ICD-10-CM | POA: Insufficient documentation

## 2024-04-14 LAB — CUP PACEART REMOTE DEVICE CHECK
Date Time Interrogation Session: 20251218231956
Implantable Pulse Generator Implant Date: 20230822

## 2024-04-17 NOTE — Progress Notes (Signed)
 Remote Loop Recorder Transmission

## 2024-04-21 ENCOUNTER — Ambulatory Visit: Payer: Self-pay | Admitting: Cardiology

## 2024-04-27 ENCOUNTER — Ambulatory Visit

## 2024-05-02 ENCOUNTER — Encounter: Payer: Self-pay | Admitting: Gastroenterology

## 2024-05-10 ENCOUNTER — Ambulatory Visit
Admission: EM | Admit: 2024-05-10 | Discharge: 2024-05-10 | Disposition: A | Discharge status: Home / Self Care | Attending: Family Medicine | Admitting: Family Medicine

## 2024-05-10 DIAGNOSIS — J208 Acute bronchitis due to other specified organisms: Secondary | ICD-10-CM

## 2024-05-10 MED ORDER — DEXAMETHASONE SOD PHOSPHATE PF 10 MG/ML IJ SOLN
10.0000 mg | Freq: Once | INTRAMUSCULAR | Status: AC
  Administered 2024-05-10: 10 mg via INTRAMUSCULAR

## 2024-05-10 MED ORDER — PROMETHAZINE-DM 6.25-15 MG/5ML PO SYRP: 5.0000 mL | ORAL_SOLUTION | Freq: Four times a day (QID) | ORAL | 0 refills | Status: AC | PRN

## 2024-05-10 MED ORDER — GUAIFENESIN ER 600 MG PO TB12: 600.0000 mg | ORAL_TABLET | Freq: Two times a day (BID) | ORAL | 0 refills | Status: AC

## 2024-05-10 NOTE — Discharge Instructions (Signed)
 We have given you a steroid shot today prior to discharge and I have prescribed some additional medications for your symptoms to the pharmacy.  If desired, you may also take Coricidin HBP, nasal spray such as Astelin or Flonase, use humidifiers, drink plenty of fluids, make sure to be taking deep breaths.  Follow-up for worsening or unresolving symptoms.

## 2024-05-10 NOTE — ED Provider Notes (Signed)
 " [facility]    CSN: [id number] Arrival date & time: 05/10/24  1110      History   Chief Complaint No chief complaint on file.   HPI Terry Mayo is a 69 y.o. male.   Patient presenting today with 2-week history of nasal congestion, hacking cough.  Denies fever, chills, chest pain, shortness of breath, wheezing, abdominal pain, vomiting, diarrhea.  Trying over-the-counter cold and congestion medication with minimal relief.  No known history of chronic pulmonary disease.    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   Swollen limb 06/15/2022   Constipation 05/05/2022   Esophagitis 12/26/2021   Right hemiplegia (HCC) 12/17/2021   History of ischemic left ICA stroke 12/10/2021   Cerebrovascular accident (CVA) (HCC) 12/10/2021   CAD (coronary artery disease) 12/05/2021   Ischemic cardiomyopathy 12/05/2021   NSVT (nonsustained ventricular tachycardia) (HCC) 12/05/2021   Hypertension 12/05/2021   Hyperlipidemia 12/05/2021   NSTEMI (non-ST elevated myocardial infarction) (HCC) 12/04/2021   Long term (current) use of antithrombotics/antiplatelets    Loss of weight 09/04/2015   Encounter for screening colonoscopy 09/04/2015    Past Surgical History:  Procedure Laterality Date   CHOLECYSTECTOMY  1990s   COLONOSCOPY WITH PROPOFOL  N/A 09/30/2015   Procedure: COLONOSCOPY WITH PROPOFOL ;  Surgeon: Lamar CHRISTELLA Hollingshead, MD;  [facility];  Service: Endoscopy;  Laterality: N/A;  930   IR CT HEAD LTD  12/10/2021   IR PERCUTANEOUS ART THROMBECTOMY/INFUSION INTRACRANIAL INC DIAG ANGIO  12/10/2021   IR US  GUIDE VASC ACCESS RIGHT  12/10/2021   KIDNEY STONE SURGERY  2005   LEFT HEART CATH AND CORONARY ANGIOGRAPHY N/A  12/04/2021   Procedure: LEFT HEART CATH AND CORONARY ANGIOGRAPHY;  Surgeon: Claudene Victory ORN, MD;  [facility]: [facility];  Service: Cardiovascular;  Laterality: N/A;   LOOP RECORDER INSERTION N/A 12/16/2021   Procedure: LOOP RECORDER INSERTION;  Surgeon: Inocencio Soyla Lunger, MD;  Location: [facility];  Service: Cardiovascular;  Laterality: N/A;   RADIOLOGY WITH ANESTHESIA N/A 12/10/2021   Procedure: IR WITH ANESTHESIA;  Surgeon: Dolphus Carrion, MD;  Location: [facility];  Service: Radiology;  Laterality: N/A;       Home Medications    Prior to Admission medications  Medication Sig Start Date End Date Taking? Authorizing Provider  guaiFENesin  (MUCINEX ) 600 MG 12 hr tablet Take 1 tablet (600 mg total) by mouth 2 (two) times daily. 05/10/24  Yes Stuart Vernell Norris, PA-C  promethazine -dextromethorphan (PROMETHAZINE -DM) 6.25-15 MG/5ML syrup Take 5 mLs by mouth 4 (four) times daily as needed. 05/10/24  Yes Stuart Vernell Norris, PA-C  albuterol  (VENTOLIN  HFA) 108 [phone] Base) MCG/ACT inhaler Inhale 2 puffs into the lungs every 6 (six) hours as needed. 10/21/23   Leath-Warren, Etta PARAS, NP  aspirin  EC 81 MG tablet Take 1 tablet (81 mg total) by mouth daily. Swallow whole. 12/07/21   Zhao, Xika, NP  busPIRone  (BUSPAR ) 7.5 MG tablet Take 1 tablet (7.5 mg total) by mouth 2 (two) times daily. 01/01/22   Landy Barnie RAMAN, NP  clopidogrel  (PLAVIX ) 75 MG tablet Take 1 tablet (75 mg total) by mouth daily with breakfast. 01/01/22   Landy Barnie RAMAN, NP  Evolocumab  (REPATHA  SURECLICK) 140 MG/ML SOAJ INJECT 140MG  INTO THE SKIN EVERY 14 DAYS. 05/24/23   Mallipeddi, Vishnu P, MD  ezetimibe  (ZETIA ) 10 MG tablet Take 1 tablet (10 mg total) by mouth daily. 04/24/22 04/19/23  Mallipeddi, Vishnu P, MD  hydrOXYzine (ATARAX) 25 MG tablet Take 25 mg by mouth 3 (three) times daily as needed.    [provider]  incobotulinumtoxinA  (XEOMIN ) 100 units SOLR injection Inject 600 Units into the muscle every 3  (three) months. 06/15/22   Onita Duos, MD  isosorbide  dinitrate (ISORDIL ) 20 MG tablet Take 1 tablet (20 mg total) by mouth 2 (two) times daily. 01/01/22   Landy Barnie RAMAN, NP  losartan  (COZAAR ) 25 MG tablet Take 1 tablet (25 mg total) by mouth daily. 01/01/22   Landy Barnie RAMAN, NP  metoprolol  succinate (TOPROL -XL) 25 MG 24 hr tablet Take by mouth. 12/06/21   [provider]  metoprolol  tartrate (LOPRESSOR ) 25 MG tablet Take 0.5 tablets (12.5 mg total) by mouth 2 (two) times daily. 01/01/22   Landy Barnie RAMAN, NP  mirtazapine (REMERON) 15 MG tablet Take 15 mg by mouth at bedtime. 03/26/22   [provider]  nitroGLYCERIN  (NITROSTAT ) 0.4 MG SL tablet Place 1 tablet (0.4 mg total) under the tongue every 5 (five) minutes x 3 doses as needed for chest pain. 01/01/22   Landy Barnie RAMAN, NP  omeprazole  (PRILOSEC OTC) 20 MG tablet Take 20 mg by mouth as needed (acid reflux).    [provider]  ondansetron  (ZOFRAN -ODT) 4 MG disintegrating tablet Take by mouth. 01/20/22   [provider]  pantoprazole  sodium (PROTONIX ) 40 mg Take 40 mg by mouth daily. Patient taking differently: Take 40 mg by mouth as needed. 01/01/22   Landy Barnie RAMAN, NP  promethazine -dextromethorphan (PROMETHAZINE -DM) 6.25-15 MG/5ML syrup Take 5 mLs by mouth 4 (four) times daily as needed. 10/21/23   Leath-Warren, Etta PARAS, NP  spironolactone  (ALDACTONE ) 25 MG tablet Take 0.5 tablets (12.5 mg total) by mouth daily. 01/01/22   Landy Barnie RAMAN, NP  traZODone (DESYREL) 50 MG tablet 1 tablet at bedtime as needed Orally Once a day for 30 day(s) 01/08/22   [provider]    Family History Family History  Problem Relation Age of Onset   Coronary artery disease Sister    Colon cancer Neg Hx     Social History Social History[1]   Allergies   Penicillins   Review of Systems Review of Systems Per HPI  Physical Exam Triage Vital Signs ED Triage Vitals  Encounter Vitals Group     BP 05/10/24  1125 105/70     Girls Systolic BP Percentile --      Girls Diastolic BP Percentile --      Boys Systolic BP Percentile --      Boys Diastolic BP Percentile --      Pulse Rate 05/10/24 1125 91     Resp 05/10/24 1125 20     Temp 05/10/24 1125 98.6 F (37 C)     Temp Source 05/10/24 1125 Oral     SpO2 05/10/24 1209 95 %     Weight --      Height --      Head Circumference --      Peak Flow --      Pain Score 05/10/24 1128 0     Pain Loc --      Pain Education --      Exclude from Growth Chart --    No  data found.  Updated Vital Signs BP 105/70 (BP Location: Right Arm)   Pulse 91   Temp 98.6 F (37 C) (Oral)   Resp 20   SpO2 95%   Visual Acuity Right Eye Distance:   Left Eye Distance:   Bilateral Distance:    Right Eye Near:   Left Eye Near:    Bilateral Near:     Physical Exam Vitals and nursing note reviewed.  Constitutional:      Appearance: Normal appearance.  HENT:     Head: Atraumatic.     Right Ear: Tympanic membrane normal.     Left Ear: Tympanic membrane normal.     Nose: Rhinorrhea present.     Mouth/Throat:     Mouth: Mucous membranes are moist.     Pharynx: Oropharynx is clear. No posterior oropharyngeal erythema.  Eyes:     Extraocular Movements: Extraocular movements intact.     Conjunctiva/sclera: Conjunctivae normal.  Cardiovascular:     Rate and Rhythm: Normal rate.  Pulmonary:     Effort: Pulmonary effort is normal. No respiratory distress.     Breath sounds: Normal breath sounds. No wheezing or rales.  Musculoskeletal:        General: Normal range of motion.     Cervical back: Normal range of motion and neck supple.  Skin:    General: Skin is warm and dry.  Neurological:     Mental Status: He is oriented to person, place, and time.  Psychiatric:        Mood and Affect: Mood normal.        Thought Content: Thought content normal.        Judgment: Judgment normal.     UC Treatments / Results  Labs (all labs ordered are listed,  but only abnormal results are displayed) Labs Reviewed - No data to display  EKG   Radiology No results found.  Procedures Procedures (including critical care time)  Medications Ordered in UC Medications  dexamethasone  (DECADRON ) injection 10 mg (10 mg Intramuscular Given 05/10/24 1225)    Initial Impression / Assessment and Plan / UC Course  I have reviewed the triage vital signs and the nursing notes.  Pertinent labs & imaging results that were available during my care of the patient were reviewed by me and considered in my medical decision making (see chart for details).     Vitals and exam overall very reassuring today, suspect viral bronchitis.  Will treat with IM Decadron , Phenergan  DM, Mucinex , supportive over-the-counter medications at home care.  Return for worsening or unresolving symptoms.  Final Clinical Impressions(s) / UC Diagnoses   Final diagnoses:  Viral bronchitis     Discharge Instructions      We have given you a steroid shot today prior to discharge and I have prescribed some additional medications for your symptoms to the pharmacy.  If desired, you may also take Coricidin HBP, nasal spray such as Astelin or Flonase, use humidifiers, drink plenty of fluids, make sure to be taking deep breaths.  Follow-up for worsening or unresolving symptoms.    ED Prescriptions     Medication Sig Dispense Auth. Provider   promethazine -dextromethorphan (PROMETHAZINE -DM) 6.25-15 MG/5ML syrup Take 5 mLs by mouth 4 (four) times daily as needed. 100 mL Stuart Vernell Norris, PA-C   guaiFENesin  (MUCINEX ) 600 MG 12 hr tablet Take 1 tablet (600 mg total) by mouth 2 (two) times daily. 20 tablet Stuart Vernell Norris, [place]      PDMP not reviewed  this encounter.    [1]  Social History Tobacco Use   Smoking status: Former    Current packs/day: 0.00    Types: Cigarettes    Quit date: 08/05/2015    Years since quitting: 8.7    Passive exposure: Past   Smokeless  tobacco: Never  Vaping Use   Vaping status: Never Used  Substance Use Topics   Alcohol use: Not Currently    Alcohol/week: 0.0 standard drinks of alcohol    Comment: No ETOH in a month. Variable, up to daily, as much as much as a fifth of liquor per sitting.   Drug use: No    Frequency: 7.0 times per week    Comment: As often as daily when he does smoke marijuana     Stuart Vernell Norris, [place] 05/10/24 1239  "

## 2024-05-10 NOTE — ED Triage Notes (Signed)
 Pt reports cough, congestion x 2 week, started off a dry cough and now is productive  has tried OTC meds but has found no relief.

## 2024-05-13 ENCOUNTER — Ambulatory Visit

## 2024-05-15 ENCOUNTER — Ambulatory Visit (HOSPITAL_COMMUNITY): Attending: Emergency Medicine

## 2024-05-15 DIAGNOSIS — Z8673 Personal history of transient ischemic attack (TIA), and cerebral infarction without residual deficits: Secondary | ICD-10-CM | POA: Diagnosis not present

## 2024-05-16 ENCOUNTER — Ambulatory Visit: Payer: Self-pay | Admitting: Cardiology

## 2024-05-16 LAB — CUP PACEART REMOTE DEVICE CHECK
Date Time Interrogation Session: 20260118232549
Implantable Pulse Generator Implant Date: 20230822

## 2024-05-19 NOTE — Progress Notes (Signed)
 Remote Loop Recorder Transmission

## 2024-05-29 ENCOUNTER — Ambulatory Visit

## 2024-05-30 ENCOUNTER — Ambulatory Visit: Admitting: Gastroenterology

## 2024-06-07 ENCOUNTER — Ambulatory Visit: Admitting: Internal Medicine

## 2024-06-13 ENCOUNTER — Ambulatory Visit

## 2024-06-15 ENCOUNTER — Ambulatory Visit

## 2024-06-29 ENCOUNTER — Ambulatory Visit

## 2024-07-11 ENCOUNTER — Ambulatory Visit: Admitting: Gastroenterology

## 2024-07-14 ENCOUNTER — Ambulatory Visit

## 2024-07-16 ENCOUNTER — Ambulatory Visit

## 2024-07-31 ENCOUNTER — Ambulatory Visit

## 2024-08-07 ENCOUNTER — Ambulatory Visit: Admitting: Internal Medicine

## 2024-08-16 ENCOUNTER — Ambulatory Visit

## 2024-09-16 ENCOUNTER — Ambulatory Visit

## 2024-10-17 ENCOUNTER — Ambulatory Visit

## 2024-11-17 ENCOUNTER — Ambulatory Visit

## 2024-12-18 ENCOUNTER — Ambulatory Visit

## 2025-01-18 ENCOUNTER — Ambulatory Visit

## 2025-02-18 ENCOUNTER — Ambulatory Visit
